# Patient Record
Sex: Male | Born: 1970 | Race: White | Hispanic: No | Marital: Single | Smoking: Current every day smoker
Health system: Southern US, Community
[De-identification: ages and names within clinical notes are randomized; demographics above are authoritative.]

## PROBLEM LIST (undated history)

## (undated) DIAGNOSIS — L219 Seborrheic dermatitis, unspecified: Secondary | ICD-10-CM

## (undated) DIAGNOSIS — C4491 Basal cell carcinoma of skin, unspecified: Secondary | ICD-10-CM

## (undated) DIAGNOSIS — R748 Abnormal levels of other serum enzymes: Secondary | ICD-10-CM

## (undated) DIAGNOSIS — C4359 Malignant melanoma of other part of trunk: Secondary | ICD-10-CM

## (undated) DIAGNOSIS — F5104 Psychophysiologic insomnia: Secondary | ICD-10-CM

## (undated) DIAGNOSIS — G2581 Restless legs syndrome: Secondary | ICD-10-CM

## (undated) HISTORY — DX: Restless legs syndrome: G25.81

## (undated) HISTORY — DX: Malignant melanoma of other part of trunk: C43.59

## (undated) HISTORY — DX: Abnormal levels of other serum enzymes: R74.8

## (undated) HISTORY — DX: Psychophysiologic insomnia: F51.04

## (undated) HISTORY — DX: Basal cell carcinoma of skin, unspecified: C44.91

## (undated) HISTORY — DX: Seborrheic dermatitis, unspecified: L21.9

---

## 2015-01-20 ENCOUNTER — Emergency Department (HOSPITAL_COMMUNITY)
Admission: EM | Admit: 2015-01-20 | Discharge: 2015-01-20 | Disposition: A | Discharge status: Home / Self Care | Payer: No Typology Code available for payment source | Source: Home / Self Care

## 2015-01-20 ENCOUNTER — Encounter (HOSPITAL_COMMUNITY): Payer: Self-pay | Admitting: Emergency Medicine

## 2015-01-20 DIAGNOSIS — D229 Melanocytic nevi, unspecified: Secondary | ICD-10-CM

## 2015-01-20 DIAGNOSIS — D239 Other benign neoplasm of skin, unspecified: Secondary | ICD-10-CM

## 2015-01-20 NOTE — ED Notes (Signed)
Pt here b/c he would like to have biopsy of birthmark?? On back It's been there since 2010... Denies pain and growth Alert, no signs of acute distress.

## 2015-01-20 NOTE — ED Provider Notes (Signed)
   CSN: [id number]     Arrival date & time 01/20/15  1301 History   None    Chief Complaint  Patient presents with  . Skin Problem  . Nevus   (Consider location/radiation/quality/duration/timing/severity/associated sxs/prior Treatment)  HPI   Is a 44 year old male presenting today with complaints of an irregularly shaped mole on the back of his left shoulder blade that he feels needs to be biopsied and removed.  Denies any other complaints or s/s of illness.  The patient states it was not present several years ago and has changed shape and size. Requests referral.   History reviewed. No pertinent past medical history. History reviewed. No pertinent past surgical history. No family history on file. History  Substance Use Topics  . Smoking status: Current Every Day Smoker -- 0.50 packs/day    Types: Cigarettes  . Smokeless tobacco: Not on file  . Alcohol Use: Yes    Review of Systems  Constitutional: Negative.  Negative for fever and fatigue.  HENT: Negative.   Eyes: Negative.   Respiratory: Negative.   Cardiovascular: Negative.   Gastrointestinal: Negative.   Endocrine: Negative.   Genitourinary: Negative.   Musculoskeletal: Negative.   Skin:       Irregular birthmark.   Allergic/Immunologic: Negative.  Negative for immunocompromised state.  Neurological: Negative.   Hematological: Negative.   Psychiatric/Behavioral: Negative.     Allergies  Review of patient's allergies indicates no known allergies.  Home Medications   Prior to Admission medications   Not on File   BP 129/86 mmHg  Pulse 94  Temp(Src) 99.1 F (37.3 C) (Oral)  Resp 18  SpO2 96%   Physical Exam  Constitutional: He appears well-developed and well-nourished. No distress.  Cardiovascular: Normal rate, regular rhythm, normal heart sounds and intact distal pulses.  Exam reveals no gallop and no friction rub.   No murmur heard. Skin: Skin is warm, dry and intact. He is not diaphoretic. No pallor.    See attached image.   Nursing note and vitals reviewed.    Skin lesion is irregularly shaped and slightly greater than 1 cm in diameter. It is slightly raised with varying color and dark spots in center.  Suspicious for melanoma and should be biopsied.  ED Course  Procedures (including critical care time) Labs Review Labs Reviewed - No data to display  Imaging Review No results found.    MDM   1. Atypical nevi    Stressed the importance of appropriate follow-up with biopsy and removal by dermatologist or primary care provider soon as possible as lesion is suspicious for melanoma. The patient verbalizes understanding and agrees to plan of care.     Nehemiah Settle, NP 01/20/15 [place] Sila Sarsfield, NP 01/20/15 1443  Addendum - 3435 The patient's mother called numerous times stating that the referral for her son to a dermatologist was insufficient and he needed an order to be seen by the [facility].  Request for congregational nursing to facilitate the referral made to Emelda Fear.   Nehemiah Settle, NP 01/20/15 [phone]

## 2015-01-20 NOTE — ED Notes (Signed)
Pt   Requesting      A     referall     To  A   Dermatologist        Jacqualyn Posey   Spoke   With      A  Dermatologist

## 2015-01-20 NOTE — Discharge Instructions (Signed)
This is NOT a diagnosis of Melanoma.  This is information for you to review to ensure you follow up appropriately. Follow up with any dermatologist as soon as possible.  If [facility] will not schedule you contact the [facility] for additional options.  Melanoma Melanoma is a form of skin cancer that begins in melanocytes. Melanocytes are the skin cells that produce pigment. Melanoma starts as a mole on the skin and can spread to other parts of the body. If found early, many cases of melanoma are curable.  CAUSES  The exact cause is unknown.  RISK FACTORS  Spending a lot of time in the sun, under a sunlamp, or in a tanning booth.  Having sunburn that blisters. The more blistering sunburns you have, the higher your risk of melanoma.  Spending time in parts of the world with more intense sunlight.  Living in a hot, sunny climate.  Having fair skin that does not tan easily.  Having had melanoma before.  Having a family history of melanoma.  Having more than 100 skin moles. SIGNS AND SYMPTOMS   ABCDE changes in a mole. ABCDE stands for:   Asymmetry. This means the mole has an irregular shape. It is not round or oval.  Border. This means the mole has an irregular or bumpy border.  Color. This means the mole has multiple colors in it, including brown, black, blue, red, or tan.  Diameter. This means the mole is more than 0.2 in (6 mm) across.  Evolving. This refers to any unusual changes or symptoms in the mole, such as pain, itching, stinging, sensitivity, or bleeding.  A new mole.  Swollen lymph nodes.  Shortness of breath.  Bone pain.  Headache.  Seizures.  Visual problems. DIAGNOSIS  Your health care provider will take a tissue sample from the mole to examine under a microscope (biopsy). The biopsy will reveal whether melanoma has spread to deeper layers of the skin. Your health care provider will also order tests, including:  Blood  tests.  Chest X-rays.  A CT scan.  A bone scan. TREATMENT  You will have surgery to remove the cancer. Your lymph nodes may also be removed during the surgery. If melanoma has spread to other organs, such as the liver, lungs, bone, or brain, you will need additional treatment.  PREVENTION  Melanoma may come back (recur) after it has been treated. Your risk of recurrence is higher if you had thick or ulcerated tumors or patches of tumors. Generally, the more advanced your melanoma, the more likely it will recur. You can help prevent melanoma from recurring by staying out of the sun, especially during peak midafternoon hours. When you are outdoors or in the sun:  Wear long sleeves, a hat, and sunglasses that block UV light when possible.  Apply a sunscreen with an SPF of 30 or higher regularly. Take these precautions on cloudy days and in the winter, even if you will be outdoors for only a short period of time. SEEK MEDICAL CARE IF:  You notice any new growths or changes in your skin.  You have had melanoma removed and you notice a new growth in the same location. Document Released: 07/08/2005 Document Revised: 11/22/2013 Document Reviewed: 09/01/2013 [facility] Patient Information 2015 [place], [place]. This information is not intended to replace advice given to you by your health care provider. Make sure you discuss any questions you have with your health care provider.

## 2015-02-07 DIAGNOSIS — C4359 Malignant melanoma of other part of trunk: Secondary | ICD-10-CM

## 2015-02-07 HISTORY — DX: Malignant melanoma of other part of trunk: C43.59

## 2015-02-07 HISTORY — PX: OTHER SURGICAL HISTORY: SHX169

## 2015-04-24 DIAGNOSIS — R748 Abnormal levels of other serum enzymes: Secondary | ICD-10-CM

## 2015-04-24 HISTORY — DX: Abnormal levels of other serum enzymes: R74.8

## 2015-07-11 ENCOUNTER — Ambulatory Visit: Payer: Self-pay | Admitting: Internal Medicine

## 2015-07-31 ENCOUNTER — Ambulatory Visit: Payer: Self-pay | Admitting: Internal Medicine

## 2015-12-29 ENCOUNTER — Telehealth: Payer: Self-pay | Admitting: Internal Medicine

## 2015-12-29 DIAGNOSIS — C4359 Malignant melanoma of other part of trunk: Secondary | ICD-10-CM

## 2015-12-29 NOTE — Telephone Encounter (Signed)
Email received from Stormy Fabian at [facility]  . Patient is due for recheck at [facility]. Per Juliann Pulse at dermatologist office patient needs to be covered by [facility] and a new referral is needed before they can schedule.  [facility] eligibility dates for patient 10/12/15 to 04/13/16. Please send message back to me when referral is ready.

## 2016-01-02 DIAGNOSIS — C4359 Malignant melanoma of other part of trunk: Secondary | ICD-10-CM | POA: Insufficient documentation

## 2016-01-13 ENCOUNTER — Encounter: Payer: Self-pay | Admitting: Internal Medicine

## 2016-01-18 NOTE — Telephone Encounter (Signed)
Referral faxed to Stormy Fabian on 01/04/16

## 2016-04-07 ENCOUNTER — Encounter (HOSPITAL_COMMUNITY): Payer: Self-pay | Admitting: Emergency Medicine

## 2016-04-07 ENCOUNTER — Encounter (HOSPITAL_COMMUNITY): Payer: Self-pay

## 2016-04-07 ENCOUNTER — Emergency Department (HOSPITAL_COMMUNITY)
Admission: EM | Admit: 2016-04-07 | Discharge: 2016-04-07 | Disposition: A | Discharge status: Home / Self Care | Payer: No Typology Code available for payment source | Attending: Emergency Medicine | Admitting: Emergency Medicine

## 2016-04-07 ENCOUNTER — Ambulatory Visit (HOSPITAL_COMMUNITY)
Admission: EM | Admit: 2016-04-07 | Discharge: 2016-04-07 | Disposition: A | Discharge status: Nursing Home (Includes ICF) | Payer: No Typology Code available for payment source | Attending: Family Medicine | Admitting: Family Medicine

## 2016-04-07 DIAGNOSIS — F17210 Nicotine dependence, cigarettes, uncomplicated: Secondary | ICD-10-CM | POA: Insufficient documentation

## 2016-04-07 DIAGNOSIS — R5383 Other fatigue: Secondary | ICD-10-CM

## 2016-04-07 DIAGNOSIS — Z789 Other specified health status: Secondary | ICD-10-CM

## 2016-04-07 DIAGNOSIS — Z7289 Other problems related to lifestyle: Secondary | ICD-10-CM

## 2016-04-07 DIAGNOSIS — Z85820 Personal history of malignant melanoma of skin: Secondary | ICD-10-CM | POA: Insufficient documentation

## 2016-04-07 DIAGNOSIS — F1010 Alcohol abuse, uncomplicated: Secondary | ICD-10-CM

## 2016-04-07 DIAGNOSIS — R42 Dizziness and giddiness: Secondary | ICD-10-CM

## 2016-04-07 LAB — CBC
HCT: 48.8 % (ref 39.0–52.0)
Hemoglobin: 16.3 g/dL (ref 13.0–17.0)
MCH: 32.9 pg (ref 26.0–34.0)
MCHC: 33.4 g/dL (ref 30.0–36.0)
MCV: 98.4 fL (ref 78.0–100.0)
PLATELETS: 132 10*3/uL — AB (ref 150–400)
RBC: 4.96 MIL/uL (ref 4.22–5.81)
RDW: 13.2 % (ref 11.5–15.5)
WBC: 5.3 10*3/uL (ref 4.0–10.5)

## 2016-04-07 LAB — BASIC METABOLIC PANEL
Anion gap: 14 (ref 5–15)
BUN: 5 mg/dL — AB (ref 6–20)
CALCIUM: 9.2 mg/dL (ref 8.9–10.3)
CHLORIDE: 103 mmol/L (ref 101–111)
CO2: 22 mmol/L (ref 22–32)
CREATININE: 0.62 mg/dL (ref 0.61–1.24)
GFR calc Af Amer: >60 mL/min (ref >60)
GFR calc non Af Amer: >60 mL/min (ref >60)
Glucose, Bld: 87 mg/dL (ref 65–99)
Potassium: 3.8 mmol/L (ref 3.5–5.1)
SODIUM: 139 mmol/L (ref 135–145)

## 2016-04-07 LAB — ETHANOL: Alcohol, Ethyl (B): 342 mg/dL (ref <5)

## 2016-04-07 NOTE — ED Provider Notes (Signed)
CSN: [id number]     Arrival date & time 04/07/16  1614 History   First MD Initiated Contact with Patient 04/07/16 1725     Chief Complaint  Patient presents with  . Fatigue  . Drug / Alcohol Assessment   (Consider location/radiation/quality/duration/timing/severity/associated sxs/prior Treatment) Patient is a 45 y.o male, with no significant PMH, presents today for fatigue. He reports haven't been feeling well for 2 weeks. Other people have been telling him that he is lethargic. He also notice himself being lethargic at times. Other people also reports that he smells like alcohol but "they shouldn't be smelling alcohol". Patient admits to drinking alcohol. For the "past decade", he have been two 12 oz can of beer daily with 2 shot of vodka mixed in it. Patient drinks this every night including last night. He also drank 6oz of beer this morning at 0800. He reports his speech to be slurred and was feeling dizzy so he had to take a cab to get here to the urgent care.       Past Medical History:  Diagnosis Date  . 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 Sarajane Jews, [facility]   History reviewed. No pertinent surgical history. No family history on file. Social History  Substance Use Topics  . Smoking status: Current Every Day Smoker    Packs/day: 0.50    Types: Cigarettes  . Smokeless tobacco: Never Used  . Alcohol use 1.8 oz/week    3 Cans of beer per week    Review of Systems  Constitutional: Positive for fatigue. Negative for chills and fever.  HENT: Negative for congestion, rhinorrhea and sneezing.   Eyes:       + watery eyes  Respiratory: Negative for cough and shortness of breath.   Cardiovascular: Negative for chest pain, palpitations and leg swelling.  Gastrointestinal: Negative for abdominal pain, diarrhea, nausea and vomiting.  Genitourinary: Negative for discharge, dysuria and penile pain.  Musculoskeletal: Negative for  myalgias.  Neurological: Positive for dizziness. Negative for weakness and headaches.       Not dizzy right now  Psychiatric/Behavioral: Negative for confusion.    Allergies  Review of patient's allergies indicates no known allergies.  Home Medications   Prior to Admission medications   Not on File   Meds Ordered and Administered this Visit  Medications - No data to display  BP 138/84 (BP Location: Left Arm)   Pulse 86   Temp 98.3 F (36.8 C) (Oral)   Resp 18   SpO2 98%  No data found.   Physical Exam  Constitutional: No distress.  Face is flushed  HENT:  Head: Normocephalic and atraumatic.  Right Ear: External ear normal.  Left Ear: External ear normal.  Nose: Nose normal.  Mouth/Throat: No oropharyngeal exudate.  Eyes: Conjunctivae and EOM are normal. Pupils are equal, round, and reactive to light. Right eye exhibits no discharge. Left eye exhibits no discharge.  Neck: Normal range of motion. Neck supple.  Cardiovascular: Normal rate, regular rhythm, normal heart sounds and intact distal pulses.   No murmur heard. Pulmonary/Chest: Effort normal and breath sounds normal. No respiratory distress. He has no wheezes.  Abdominal: Soft. Bowel sounds are normal. He exhibits no distension. There is no tenderness.  Musculoskeletal: Normal range of motion. He exhibits no edema or deformity.  Lymphadenopathy:    He has no cervical adenopathy.  Neurological:  Mental status. Appearance, behavior, speech appropriate. Alert and oriented to person, place,  time. Thoughts coherent. Motor no atrophy, weakness, or tremors. Gait appropriate.  Sensory and motor intact. Cranial nerves intact. Aable to walk straight-line heel to toe fashion. Romberg positive.    Skin: He is not diaphoretic.  Nursing note and vitals reviewed.   Urgent Care Course   Clinical Course    Procedures (including critical care time)  Labs Review Labs Reviewed - No data to display  Imaging Review No  results found.       MDM   1. Alcohol consumption of one to four drinks per day    Patient has facial flushing and positive romberg on exam, otherwise well. He is otherwise healthy with no significant medical history. His vital signs are appropriate. His symptoms is very highly suspected to be due to alcohol consumption. Supervising consulted and patient send to ER for fluid, and electrolyte check. Patient is fine with this plan. Transferred via shuttle.    Barry Dienes, NP 04/07/16 1902

## 2016-04-07 NOTE — ED Provider Notes (Signed)
[facility] DEPT Provider Note   CSN: [id number] Arrival date & time: 04/07/16  1807     History   Chief Complaint Chief Complaint  Patient presents with  . Fatigue    HPI Stephen Miranda is a 45 y.o. male.  The patient is a 45 year old male, he admits to having a history of heavy alcohol consumption including last night. He has artery been to the urgent care today, they sent him here because of an abnormal neurologic exam as he was off balance when walking. The patient endorses that he does drink heavily, he drank last night and then drank again this morning all of the alcohol that he did not finish last night. The patient states that he feels a little bit wobbly when he walks when he drinks, he denies drinking that much today despite having an alcohol level of 342 on arrival. He denies any other physical symptoms, he denies fatigue, he denies chest pain, denies shortness of breath, denies abdominal pain, denies nausea vomiting or diarrhea.      Past Medical History:  Diagnosis Date  . 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 Sarajane Jews, [facility]    Patient Active Problem List   Diagnosis Date Noted  . Melanoma ([place]) 01/02/2016    History reviewed. No pertinent surgical history.     Home Medications    Prior to Admission medications   Not on File    Family History No family history on file.  Social History Social History  Substance Use Topics  . Smoking status: Current Every Day Smoker    Packs/day: 0.50    Types: Cigarettes  . Smokeless tobacco: Never Used  . Alcohol use 1.8 oz/week    3 Cans of beer per week     Allergies   Review of patient's allergies indicates no known allergies.   Review of Systems Review of Systems  All other systems reviewed and are negative.    Physical Exam Updated Vital Signs BP 125/75 (BP Location: Right Arm)   Pulse 92   Temp 98.4 F (36.9 C) (Oral)   Resp 18   Ht  6\' 4"  (1.93 m)   Wt 200 lb (90.7 kg)   SpO2 96%   BMI 24.34 kg/m   Physical Exam  Constitutional: He appears well-developed and well-nourished. No distress.  HENT:  Head: Normocephalic and atraumatic.  Mouth/Throat: Oropharynx is clear and moist. No oropharyngeal exudate.  Eyes: Conjunctivae and EOM are normal. Pupils are equal, round, and reactive to light. Right eye exhibits no discharge. Left eye exhibits no discharge. No scleral icterus.  Neck: Normal range of motion. Neck supple. No JVD present. No thyromegaly present.  Cardiovascular: Normal rate, regular rhythm, normal heart sounds and intact distal pulses.  Exam reveals no gallop and no friction rub.   No murmur heard. Pulmonary/Chest: Effort normal and breath sounds normal. No respiratory distress. He has no wheezes. He has no rales.  Abdominal: Soft. Bowel sounds are normal. He exhibits no distension and no mass. There is no tenderness.  Musculoskeletal: Normal range of motion. He exhibits no edema or tenderness.  Lymphadenopathy:    He has no cervical adenopathy.  Neurological: He is alert. Coordination normal.  The patient's speech is clear, he is able to follow commands with normal strength, normal sensation, normal coordination while laying in the bed.  Skin: Skin is warm and dry. No rash noted. No erythema.  Psychiatric: He has a normal mood and  affect. His behavior is normal.  Nursing note and vitals reviewed.    ED Treatments / Results  Labs (all labs ordered are listed, but only abnormal results are displayed) Labs Reviewed  BASIC METABOLIC PANEL - Abnormal; Notable for the following:       Result Value   BUN 5 (*)    All other components within normal limits  CBC - Abnormal; Notable for the following:    Platelets 132 (*)    All other components within normal limits  ETHANOL - Abnormal; Notable for the following:    Alcohol, Ethyl (B) 342 (*)    All other components within normal limits  URINALYSIS,  ROUTINE W REFLEX MICROSCOPIC (NOT AT [facility])  CBG MONITORING, ED    EKG  EKG Interpretation  Date/Time:  Sunday April 07 2016 18:23:22 EDT Ventricular Rate:  83 PR Interval:  154 QRS Duration: 108 QT Interval:  384 QTC Calculation: 451 R Axis:   46 Text Interpretation:  Normal sinus rhythm Normal ECG No old tracing to compare Confirmed by Elisabeth Strom  MD, Jessice Madill ([id number]) on 04/07/2016 9:50:32 PM       Radiology No results found.  Procedures Procedures (including critical care time)  Medications Ordered in ED Medications - No data to display   Initial Impression / Assessment and Plan / ED Course  I have reviewed the triage vital signs and the nursing notes.  Pertinent labs & imaging results that were available during my care of the patient were reviewed by me and considered in my medical decision making (see chart for details).  Clinical Course    The patient is clearly intoxicated with alcohol, he is lying about his alcohol consumption based on his alcohol level. He denies any coingestants substances including ethylene glycol, rubbing alcohol or mouthwash. I suspect that he has a significant alcohol problem, he was offered assistance, he declines, he is stable for discharge, he has a sober ride.  Final Clinical Impressions(s) / ED Diagnoses   Final diagnoses:  Alcohol abuse    New Prescriptions New Prescriptions   No medications on file     Noemi Chapel, MD 04/07/16 2159

## 2016-04-07 NOTE — ED Notes (Signed)
Pt has ETOH odor with bilateral redness to his eyes. Pt reports drinking two beers and two shots of Vodka every night before he goes to bed.

## 2016-04-07 NOTE — ED Notes (Addendum)
Critical etoh called by lab 342

## 2016-04-07 NOTE — Discharge Instructions (Signed)
Substance Abuse Treatment Programs ° °Intensive Outpatient Programs °[facility]     °[address]      °[place], [place]                   °[phone]      ° °[facility] °[address] °[place], [place] °[phone] ° °[facility]     °(Inpatient and outpatient)     °[address]           °[phone]   ° °[facility] °[phone] (Suboxone and Methadone) ° °[address]      °[place], [place] [address]      °[phone]      ° °[address] °[place], [place] °[phone] ° °[facility] (Outpatient/Inpatient, Chemical)    °(insurance only) [phone]      °       °[facility] (Groups & Residential) °[place], [place] °[phone] ° °   °[facility]     °[address]     °[place], [place]      °[phone]      ° °[facility] (for caregivers and family) °[address] °[place], [place] °336-299-4655 ° ° ° ° ° °Residential Treatment Programs °[facility]      °[address], [place], [place] [address]  °[phone]      ° °[facility] °[address] [place], [place] [address] °[phone] ° °[facility]        °336-248-8914      ° °[facility] °1-800-659-3381 ° °[facility]             °[address]                                         °[place], [place]                                                °877-615-2722 or 336-784-9470                              ° °[facility] [place] °[address] °[place], [address] °1.877.941.8954 ° °[facility]    °[address]      °[place], [place]     °[phone]      ° °[facility] °[address] °[place], [place] °[phone] ° °[facility]   °[address]     °[place], [place] [address]     °[phone]      °Admissions: 8am-3pm M-F ° °[facility] °[address] °[place],  [place] °336-227-7417 ° °[facility]: Residential Program (90 Days)   °[place], [place]      °336-725-8389 or 800-758-6077    ° °[facility] °[place], [place] °(Walk in Hours over the weekend or by referral) ° °[facility] °[address], [place],  [address] °([phone]) [phone] ° °Crisis Mobile: [facility]:  1-877-626-1772 (for crisis response 24 hours a day) °[facility] Hotline:      1-800-256-2452 °Outpatient Psychiatry and Counseling ° °[facility]: Mobile Crisis   Management 24 hours:  1-877-626-1772 ° °[facility] sliding scale fee and walk in schedule: M-F 8am-12pm/1pm-3pm °[address]  °[place], [place] [address] °[phone] ° °[facility] °[address] °[place], [place] [address] °336-703-9650 ° °[facility] ([facility])- new patient walk-in appointments available Monday - Friday 8am -3pm.          °[address] °[place], [place] [address] °336-676-6840 or crisis line- 336-676-6905 ° °[facility]/ Intensive Outpatient Therapy Program °[address] °[place], [place] [address] °336-832-9804 ° °[facility]                  °Crisis Services      °[phone]      °[address]     °[place], [place] [address]                ° °[facility]   °[facility] °[phone] °[address] °[place], [place] [address] ° ° °[facility]          °[address]  °[place], [place] [address]       °([phone] ° °[facility] °[address] °[place], [place] [address] °336-292-1510 ° °[facility]    °[address]    °[place], [place] [address]     °[phone]      ° °Parish McKinney, MD     °[address]     °[place] [address]     °[phone]     °  °[facility] °[address] °[place] [address] ° °[facility]     °[address]     °[place], [place]      °336-542-2076      ° °[facility] °Shamsher Ahluwalia, MD °[address] °[place], [place] [address] °[phone] ° °[facility]     °[address]     °[place], [place] [address]     °336-274-1237      ° °[facility] °[address] °[place], [place] [address] °336-854-4450 °Resources for Temporary Residential Assistance/Crisis Centers ° °DAY CENTERS °[facility] °M-F 8am-3pm   °[address] [place], [place] [address]   336-332-0824 °Services include: laundry, barbering, support groups, case management, phone  & computer access, showers, AA/NA mtgs, mental health/substance abuse nurse, job skills class, disability information, VA assistance, spiritual classes, etc.  ° °HOMELESS SHELTERS ° °[facility]     °[facility]   °[address], [place]     °336.271.5959       °       °[facility] (women and children)       °[address] °[place], [place] [address] °336-275-0820 °[email or web address] for application and process °Application Required ° °[facility]   °[address]    °[place] [address]     °[phone]       °             °[facility] °[address] °[place], [place] [address] °336.273.5572 °336-235-0363(schedule application appt.) °Application Required ° °[facility] (women only)    °[address]     °[place], [place] [address]     °336-884-1039      °  Intake starts 6pm daily °Need valid ID, SSC, & Police report °[facility] [place] °[address] °[place], [place] °336-881-5420 °Application Required ° °[facility] (men only)     °[address]      °[place], [place]     °[phone]      ° °[facility] °(Pregnant women only) °[address] °[place], [place] °336-275-0206 ° °[facility]      °[address]      °[place], [place] [address]     °[phone]      °       °[facility] [place] [facility] °[address] °[place], [place] °336-723-1848 °90 day commitment/SA/Application process ° °[facility](men only)     °[address]     °[place], [place]     °[phone]       °Check-in at 7pm     °       °[facility] °[address] °[place], [place] [address] °336-248-6684 °Men/Women/Women and Children must be there by 7 pm ° °[facility] °[place], [place] °336-722-8721                ° °

## 2016-04-07 NOTE — ED Notes (Signed)
Dr. Lenna Sciara called with critical etoh value

## 2016-04-07 NOTE — ED Triage Notes (Signed)
Per Pt, Pt is coming from UC with complaints of "feeling drunk" without having a drink in the last ten hours. Pt reprt slurred speech and generalized fatigue that has been going on for a couple months, but he noticed today being worse. Pt denies medical Hx of the same.

## 2016-04-07 NOTE — ED Triage Notes (Signed)
Pt c/o feeling weak and tired and reports he "feels drunk"  States he had 36 ounces of liquor in 24 hours  Voices no other concerns.... A&O x4... NAD

## 2016-04-10 ENCOUNTER — Encounter: Payer: Self-pay | Admitting: Internal Medicine

## 2016-04-10 ENCOUNTER — Ambulatory Visit (INDEPENDENT_AMBULATORY_CARE_PROVIDER_SITE_OTHER): Payer: Self-pay | Admitting: Internal Medicine

## 2016-04-10 VITALS — BP 110/70 | HR 68 | Resp 12 | Ht 73.75 in | Wt 198.0 lb

## 2016-04-10 DIAGNOSIS — G2581 Restless legs syndrome: Secondary | ICD-10-CM

## 2016-04-10 DIAGNOSIS — F1010 Alcohol abuse, uncomplicated: Secondary | ICD-10-CM

## 2016-04-10 MED ORDER — GABAPENTIN 100 MG PO CAPS: ORAL_CAPSULE | ORAL | 3 refills | Status: DC

## 2016-04-10 NOTE — Patient Instructions (Signed)
Gabapentin --start with 1 cap by mouth at bedtime.  May increase by 100 mg cap every 3 days if symptoms not relieved. No alcohol with this

## 2016-04-10 NOTE — Progress Notes (Signed)
Subjective:    Patient ID: Stephen Miranda, male    DOB: 1970/11/16, 45 y.o.   MRN: [id number]  HPI  Pt. Here after long hiatus.  Was seen in Urgent Care and ED 4 days ago with alcohol level of 342 mg/dL.  Romberg in Urgent care documented as positive I thought he was asking to get treatment for alcohol abuse, but patient denies that today and in fact, does not feel he has a drinking problem.  He is drinking two 12 oz beers with a shot of vodka in each nightly.  The day he was seen, he apparently repeated that intake that morning.   He has had these two drinks nightly for many years so he can sleep.  Maybe for 15 years. He states he has not had anything alcoholic to drink since Sunday (the day he was seen in ED).  He states he felt ok, but was convinced by his coworker that morning/noon at work that he was not "ok" and should be seen.  His coworker told him he was slurring and moving very slowly.  His mother also ended up calling him at work and felt the same way.  She convinced him to go to the ED.  He states he finished his drinking the night before around 1 a.m.  Was up at 7:30 a.m. And had the last 2 drinks soon after.    He feels that since he has stayed away from the alcohol, he feels foggy, but he has not been sleeping well also.  He feels he is just fatigued. He also feels he has restless leg syndrome and that is what keeps him up. Does admit he may be depressed.  Has had counseling when child as picked on, gained weight and picked on even more. "always screw stuff up at some point" Has felt spacy and forgetful--like he is either watching 4 screens at the same time, or he can be hyperfocused and miss everything around him. Was prescribed prozac in college--not sure why, but med made him not care that he was failing. Attempted suicide at age 76 yo trying to freeze himself to death. States was diagnosed with ADD.  Took different things--made him spacey and ultimately erectile dysfunction. Does  feel he has difficulties picking up on social cues, awkward socially often.  When asked if he had ever been evaluated for Asperger's, he stated he has thought in past he could be mildly affected by Aspergers or Autistic spectrum   Has had restless legs since moving here 15 years ago.        Depression screen [facility] 2/9 04/10/2016  Decreased Interest 0  Down, Depressed, Hopeless 1  PHQ - 2 Score 1  Altered sleeping 3  Tired, decreased energy 2  Change in appetite 1  Feeling bad or failure about yourself  2  Trouble concentrating 1  Moving slowly or fidgety/restless 1  Suicidal thoughts 0  PHQ-9 Score 11  Difficult doing work/chores Somewhat difficult     Review of Systems     Objective:   Physical Exam Mildly anxious appearing HEENT:  PERRL, EOMI, discs sharp, TMs pearly gray, throat without injection, Neck:  Supple, No adenopathy Chest:  CTA CV: RRR without murmur or rub, radial and DP pulses normal and equal Abd:  S, NT, No HSM or mass, + BS LE:  No edema Neuro:  A & O x 3 , CN II-XII grossly intact, DTRs 2+/4 throughout, Motora 5/5 throughout, sensory grossly normal.  Rapid  alternating motions, gait normal.  Speech clear.       Assessment & Plan:  1.  Restless Leg Syndrome:  This seems to be the patient's main focus today.  Seems to feel if he can get this under control, he will be able to sleep and not have to drink his equivalent of 4 drinks nightly. Unable to obtain Requip or Mirapex at an affordable cost.  Will try Gabapentin Try Gabapentin.  Gradually titrate to 300 mg at bedtime.  To avoid alcohol. Followup in 6 weeks  2.  Concern for alcohol abuse:  Did have elevated AST/ALT when established last year consistent with alcohol overuse. Encouraged patient to avoid.  Will start counseling sessions also with SW,Tybreisha Coralyn Mark. No findings of other neurologic disorder to explain behavior recently at work.  3.  Social awkwardness/awareness: Evaluation with Elvis Coil  regarding possibility of something on autistic spectrum.  May also have depression/anxiety as well.  Consider treatment for the latter two if no improvement.

## 2016-04-26 ENCOUNTER — Ambulatory Visit (INDEPENDENT_AMBULATORY_CARE_PROVIDER_SITE_OTHER): Payer: Self-pay | Admitting: Licensed Clinical Social Worker

## 2016-04-26 DIAGNOSIS — Z1389 Encounter for screening for other disorder: Secondary | ICD-10-CM

## 2016-04-26 DIAGNOSIS — Z1331 Encounter for screening for depression: Secondary | ICD-10-CM

## 2016-05-01 NOTE — Progress Notes (Signed)
   THERAPY PROGRESS NOTE  Session Time: 60 minutes  Participation Level: Active  Behavioral Response: Well GroomedAlertEuthymic  Type of Therapy: Individual Therapy  Treatment Goals addressed: Anxiety and Coping  Interventions: Motivational Interviewing and Supportive  Summary: Stephen Miranda is a 45 y.o. male who presents with a euthymic mood and appropriate affect. Stephen Miranda reported that he was seeking counseling as a recommendation from Dr. Tawni Carnes because of signs of depression. He mentioned that he struggled with depression during his childhood and adolescence due to bullying but participated in activities with his family to cope. 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 He explained that at this point he feels like he is stuck and cannot get anywhere. He mentioned that he grew up with [place] parents, therefore he moved around a lot. He has had a history of mental health treatment when he went to counseling as an adolescent for his depression. He has also been diagnosed with ADHD as well. He mentioned that he is currently single but ended a relationship with his girlfriend around a year ago because of an incident involving alcohol. He mentioned some incidents involving alcohol such as the situation with his girlfriend and other times when he was binge drinking. He also stated that his grandmother and other family members on his dad side have struggled with alcoholism. He did not state that he personally had an alcohol problem. Stephen Miranda mentioned that he wants his treatment goal to be focused on depression, finding motivation, discovering new interests, and his smoking  Suicidal/Homicidal: Nowithout intent/plan  Therapist Response: Social Work Intern (SWI) began the clinical assessment but did not complete the symptoms checklist. SWI reflected on Stephen Miranda's thoughts about reasons  that he came to counseling and his feelings on the difficulties that he's been having lately with feeling stuck. SWI looked into family dynamics, history of symptoms, trauma, and strengths. SWI began the treatment plan with Stephen Miranda. SWI will complete the symptoms checklist by using the PHQ-9 and GAD-7.   Plan: Return again in 2 weeks.  Diagnosis: Axis I: See Hospital Problem List    Axis II: No diagnosis    [name], Student-Social Work 05/01/2016

## 2016-05-10 ENCOUNTER — Ambulatory Visit (INDEPENDENT_AMBULATORY_CARE_PROVIDER_SITE_OTHER): Payer: Self-pay | Admitting: Licensed Clinical Social Worker

## 2016-05-10 DIAGNOSIS — F330 Major depressive disorder, recurrent, mild: Secondary | ICD-10-CM

## 2016-05-10 DIAGNOSIS — F411 Generalized anxiety disorder: Secondary | ICD-10-CM
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
## 2016-05-17 ENCOUNTER — Ambulatory Visit (INDEPENDENT_AMBULATORY_CARE_PROVIDER_SITE_OTHER): Payer: Self-pay | Admitting: Licensed Clinical Social Worker

## 2016-05-17 DIAGNOSIS — F330 Major depressive disorder, recurrent, mild: Secondary | ICD-10-CM

## 2016-05-17 DIAGNOSIS — F411 Generalized anxiety disorder: Secondary | ICD-10-CM
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
## 2016-05-23 ENCOUNTER — Encounter: Payer: Self-pay | Admitting: Internal Medicine

## 2016-05-23 DIAGNOSIS — F5104 Psychophysiologic insomnia: Secondary | ICD-10-CM

## 2016-05-23 DIAGNOSIS — L219 Seborrheic dermatitis, unspecified: Secondary | ICD-10-CM

## 2016-05-23 DIAGNOSIS — Z720 Tobacco use: Secondary | ICD-10-CM | POA: Insufficient documentation

## 2016-05-23 DIAGNOSIS — G2581 Restless legs syndrome: Secondary | ICD-10-CM | POA: Insufficient documentation

## 2016-05-23 HISTORY — DX: Restless legs syndrome: G25.81

## 2016-05-23 HISTORY — DX: Psychophysiologic insomnia: F51.04

## 2016-05-23 HISTORY — DX: Seborrheic dermatitis, unspecified: L21.9

## 2016-05-24 ENCOUNTER — Ambulatory Visit (INDEPENDENT_AMBULATORY_CARE_PROVIDER_SITE_OTHER): Payer: Self-pay | Admitting: Licensed Clinical Social Worker

## 2016-05-24 ENCOUNTER — Ambulatory Visit: Payer: Self-pay | Admitting: Internal Medicine

## 2016-05-24 DIAGNOSIS — F411 Generalized anxiety disorder: Secondary | ICD-10-CM

## 2016-05-24 DIAGNOSIS — F330 Major depressive disorder, recurrent, mild: Secondary | ICD-10-CM

## 2016-05-29 NOTE — Progress Notes (Signed)
   THERAPY PROGRESS NOTE  Session Time: 60 minutes  Participation Level: Active  Behavioral Response: Well GroomedAlertAnxious  Type of Therapy: Individual Therapy  Treatment Goals addressed: Anxiety   Interventions: Solution Focused and Other: Mindfullness  Summary: Stephen Miranda is a 45 y.o. male who presents with a euthymic mood and appropriate affect. Stephen Miranda began the session by explaining some of his concerns with his class that just finished up yesterday. He missed his last big assignment and he did not do as well as expected on his last exam. He expressed some feelings of anxiety because he has to begin a new class on Monday. 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 Stephen Miranda began to put his goals in order and break them into small manageable ones. He mentioned that he did not want any changes with his social life because he experiences some agoraphobic symptoms. He also explained that he has no desire to have a family in the future. 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 Stephen Miranda tried a Nurse, mental health and expressed his concern that they may not work for him long term..   Suicidal/Homicidal: Nowithout intent/plan  Therapist Response: Social work Theatre manager asked Stephen Miranda how his week has been. SWI asked more about his communication with his professor. SWI began to ask what goals Stephen Miranda had in mind for himself and guide him to breaking down those goals into small attainable ones. SWI asked Stephen Miranda to describe what life was like for him when he felt that things were going well for him in his life. SWI ended the session with a relaxation technique.   Plan: Return again in 1 week.  Diagnosis: Axis I: See Hospital Problem List    Axis II: No diagnosis    [name], Student-Social Work 05/29/2016

## 2016-05-31 ENCOUNTER — Ambulatory Visit (INDEPENDENT_AMBULATORY_CARE_PROVIDER_SITE_OTHER): Payer: Self-pay | Admitting: Licensed Clinical Social Worker

## 2016-05-31 DIAGNOSIS — F330 Major depressive disorder, recurrent, mild: Secondary | ICD-10-CM

## 2016-05-31 DIAGNOSIS — F411 Generalized anxiety disorder: Secondary | ICD-10-CM

## 2016-05-31 NOTE — Progress Notes (Signed)
   THERAPY PROGRESS NOTE  Session Time: 60 minutes  Participation Level: Active  Behavioral Response: Well GroomedAlertIrritable  Type of Therapy: Individual Therapy  Treatment Goals addressed: Anxiety  Interventions: CBT, Motivational Interviewing and Solution Focused  Summary: Oniel Karen is a 45 y.o. male who presents with irritable mood and appropriate affect. Antawan completed the depression and anxiety screeners at the beginning of the session. Simba shared some of his frustrations with living at home with his parents. Demarko mentioned feeling stuck because he is unable to communicate that his mother's nagging is hurting him rather than helping him.  He explained that he is not quite ready to mention to his mother that her constant nagging is bothering him out of fear that he will be kicked out. 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 He had some difficulty coming up with any reason to keep himself motivated or anything to look forward to. He realized that the reason why he is having difficulty finding motivation and any enjoyment in his day was because he did not have a job. Matisse decided that his short term goal was to complete the semester with an A broke it down into steps to take to accomplish that goal.    Suicidal/Homicidal: Nowithout intent/plan  Therapist Response: SWI had Edvardo complete the PHQ-9 and GAD-7 screeners. SWI utilized supportive counseling techniques to validate emotions surrounding living with his parents and being in [place]. SWI used motivational interviewing to get a better understanding of what is currently his main concern was and what was hindering his motivation. SWI helped [name] complete a CBT worksheet focused on planning for change.   Plan: Return again in 1 week.  Diagnosis: Axis I: See Hospital Problem List    Axis II: No diagnosis    [name],  Student-Social Work 05/31/2016

## 2016-06-07 ENCOUNTER — Ambulatory Visit (INDEPENDENT_AMBULATORY_CARE_PROVIDER_SITE_OTHER): Payer: Self-pay | Admitting: Licensed Clinical Social Worker

## 2016-06-07 DIAGNOSIS — F411 Generalized anxiety disorder: Secondary | ICD-10-CM

## 2016-06-07 DIAGNOSIS — F330 Major depressive disorder, recurrent, mild: Secondary | ICD-10-CM

## 2016-06-07 NOTE — Progress Notes (Signed)
   THERAPY PROGRESS NOTE  Session Time: 60 minutes  Participation Level: Active  Behavioral Response: Well GroomedAlertEuthymic  Type of Therapy: Individual Therapy  Treatment Goals addressed: Anxiety  Interventions: Motivational Interviewing  Summary: Stephen Miranda is a 45 y.o. male who presents with euthymic mood and appropriate affect. [name] began the session by reflecting on last sessions about support. [name] realized that he did not have a social support system that he could rely on or anyone who relies on him. He explained that he struggles to communicate with others that he wants to socialize. He mentioned that he does not know where to start with finding new connections. He shared that one of the biggest reasons that he does not have a connection with others is due to the fact that he has moved around so much during his life that he doesn't put forth the effort to establish new relationships. He explained that it is tough to establish anything in [place] because he would rather live in [place] again.   Suicidal/Homicidal: Nowithout intent/plan  Therapist Response: Social Work Theatre manager (Perry) inquired more about how he initiates conversations with new people and what attempts he has made in the past. SWI challenged Stephen Miranda to initiate a conversation with someone from his classes to recognize some of his communication patterns. SWI utilized supportive counseling techniques to validate emotions regarding not having a stable connection with others while moving throughout his life. SWI asked [name] to reflect on some of the unique experiences that he had growing up over the years so that he could recognize things that he valued or learned about himself that made him unique.   Plan: Return again in 2 weeks.  Diagnosis: Axis I: See Hospital Problem List    Axis II: No diagnosis    [name], Student-Social Work 06/07/2016

## 2016-06-21 ENCOUNTER — Ambulatory Visit (INDEPENDENT_AMBULATORY_CARE_PROVIDER_SITE_OTHER): Payer: Self-pay | Admitting: Licensed Clinical Social Worker

## 2016-06-21 DIAGNOSIS — F330 Major depressive disorder, recurrent, mild: Secondary | ICD-10-CM

## 2016-06-21 DIAGNOSIS — F411 Generalized anxiety disorder: Secondary | ICD-10-CM

## 2016-06-21 NOTE — Progress Notes (Signed)
   THERAPY PROGRESS NOTE  Session Time: 60 minutes  Participation Level: Active  Behavioral Response: Well GroomedAlertDepressed  Type of Therapy: Individual Therapy  Treatment Goals addressed: Diagnosis: Major Depressive Disorder and Generalized Anxiety Disorder  Interventions: Motivational Interviewing  Summary: Stephen Miranda is a 45 y.o. male who presents with depressed mood and appropriate affect. Stephen Miranda reported that he got his old job back which has been impacting his school work. Stephen Miranda mentioned that he has been struggling with time management between school and work. He stated that he is still struggling with connecting with others and that it has always been an issue for him. He recalled moments during his childhood that have led him to want to hold back in conversation. He mentioned that he has been feeling really down lately and wants to sleep most of the time. Stephen Miranda questioned why he was able to get over the end of his relationship so quickly. He realized that he had difficulty facing a lot of guilt. He also realized that alcohol has played a major part in him losing his job and his relationship. He mentioned that he may want to cut back on the drinking. Stephen Miranda also expressed that he was disappointed that he needed counseling because he should be handling things on his own.    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 SWI inquired more about how things have changed since his job has started. SWI asked what things were standing in the way of him being happy now that not having a job is no longer a barrier. SWI utilized supportive counseling techniques to validate feelings regarding his last relationship and his feelings of guilt. SWI normalized the fact that he was so tearful during the session and that he was ashamed to be in counseling.   Plan: Return again in 1 week.  Diagnosis: Axis I: See Hospital Problem  List    Axis II: No diagnosis    [name], Student-Social Work 06/21/2016

## 2016-06-28 ENCOUNTER — Ambulatory Visit (INDEPENDENT_AMBULATORY_CARE_PROVIDER_SITE_OTHER): Payer: Self-pay | Admitting: Licensed Clinical Social Worker

## 2016-06-28 DIAGNOSIS — F411 Generalized anxiety disorder: Secondary | ICD-10-CM

## 2016-06-28 DIAGNOSIS — F330 Major depressive disorder, recurrent, mild: Secondary | ICD-10-CM

## 2016-06-28 NOTE — Progress Notes (Signed)
   THERAPY PROGRESS NOTE  Session Time: 60 minutes  Participation Level: Active  Behavioral Response: Well GroomedAlertEuthymic  Type of Therapy: Individual Therapy  Treatment Goals addressed: Diagnosis: Major Depressive Disorder and Generalized Anxiety  Interventions: Motivational Interviewing  Summary: [name] is a 45 y.o. 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 He stated that his mother was concerned again with his drinking and that he was considering cutting down on drinking. He explained that he doesn't think that his drinking is a problem but he does admit that drinking has impacted two major aspects of his life. He lifted off the major positives and negatives of drinking but still came to the conclusion that it is not much of a problem but he still wants to reduce the drinking. He stated that he even had a beer at 9am this morning before work.  [name] explained that sometimes he drinks to as an incentive when he has to accomplish a lot or when he has had a long day. [name] also mentions that he is having difficulty feeling motivated to do anything because can't think of any true desires that he really has.   Suicidal/Homicidal: Nowithout intent/plan  Therapist Response: SWI utilized motivational interviewing to gauge where [name] was as far as wanting to control his drinking. SWI asked [name] to list the positives and negatives of drinking to help him consider whether or not the drinking was more helpful or harmful. SWI asked [name] if there are other things that he uses to wind down. SWI utilized supportive counseling techniques as he struggled to think of things to motivated for.   Plan: Return again in 3 weeks.  Diagnosis: Axis I: See Hospital Problem List    Axis II: No diagnosis    [name], Student-Social Work 06/28/2016

## 2016-07-19 ENCOUNTER — Ambulatory Visit: Payer: Self-pay | Admitting: Internal Medicine

## 2016-08-02 ENCOUNTER — Ambulatory Visit (INDEPENDENT_AMBULATORY_CARE_PROVIDER_SITE_OTHER): Payer: Self-pay | Admitting: Licensed Clinical Social Worker

## 2016-08-02 DIAGNOSIS — F330 Major depressive disorder, recurrent, mild: Secondary | ICD-10-CM

## 2016-08-02 DIAGNOSIS — F411 Generalized anxiety disorder: Secondary | ICD-10-CM

## 2016-08-02 NOTE — Progress Notes (Signed)
   THERAPY PROGRESS NOTE  Session Time: 50 minutes  Participation Level: Active  Behavioral Response: Well GroomedAlertAnxious  Type of Therapy: Individual Therapy  Treatment Goals addressed: Diagnosis: Depression  Interventions: Motivational Interviewing  Summary: Stephen Miranda is a 46 y.o. male who presents with anxious mood and appropriate affect. Stephen Miranda mentioned that he has been feeling down because he has been sick. Stephen Miranda notified Social Work Intern that he recently lost his job because he had a "panic attack" on his way to work. He described the incident by stating that he felt no somatic symptoms but he just felt very lost and his mind was cloudy. Stephen Miranda acknowledged some regret that he did not communicate with his boss rather than just taking an absence. Stephen Miranda mentioned no panic attacks in the past. He also stated that right after he turned away from his job he drank in his car and drove home. Stephen Miranda mentioned that his mother wants him to continue [place] meetings but he feels like he is a fraud due to him not seeing his drinking as a true issue despite two major issues involving alcohol. He stated that he is not willing to stop drinking at this point. Stephen Miranda considered whether taking the medication was more helpful for his restless legs rather than drinking. Stephen Miranda explained that the depression issue rather than the depression. Stephen Miranda stated a few goals that he has that were tangible such as a house, dog, and tv but struggled to think of things that he wanted to gain on an emotional level.   Suicidal/Homicidal: Nowithout intent/plan  Therapist Response: Social Work Intern asked Stephen Miranda to give details of his panic attack step by step. SWI asked Stephen Miranda if he experienced any somatic symptoms during his panic attack. SWI asked about any negative thoughts or feelings about his job that may have been worrying him. SWI assessed Stephen Miranda's readiness to change in terms of his drinking by using scaling questions. SWI inquired  more about Stephen Miranda's goals and desires. After Stephen Miranda mentioned only physical things that he wanted SWI asked if Stephen Miranda could think of any personal goals that he had or in relation to his emotional wellbeing. SWI intern suggested that they discuss more personal goals in the next session as well as practicing more relaxation techniques.   Plan: Return again in 1 week.  Diagnosis: Axis I: See Hospital Problem List    Axis II: No diagnosis    Stephen Miranda, Student-Social Work 08/02/2016

## 2016-08-09 ENCOUNTER — Ambulatory Visit (INDEPENDENT_AMBULATORY_CARE_PROVIDER_SITE_OTHER): Payer: Self-pay | Admitting: Licensed Clinical Social Worker

## 2016-08-09 DIAGNOSIS — F411 Generalized anxiety disorder: Secondary | ICD-10-CM

## 2016-08-09 DIAGNOSIS — F330 Major depressive disorder, recurrent, mild: Secondary | ICD-10-CM

## 2016-08-09 NOTE — Progress Notes (Signed)
   THERAPY PROGRESS NOTE  Session Time: 60 minutes  Participation Level: Active  Behavioral Response: Well GroomedAlertEuthymic  Type of Therapy: Individual Therapy  Treatment Goals addressed: Diagnosis: Major Depressive Disorder, Mild, Reccurent.   Interventions: Supportive  Summary: Kramer Dejesus is a 45 y.o. male who presents with Euthymic mood and appropriate affect. Markian explained that he was still feeling down lately because he was still sick and because of the weather. Damion completed the PHQ-9 and GAD-7. Izaya scored low on the anxiety scale but high on the depression scale. He mentioned depressive symptoms such as poor appetite, low energy, and feelings of hopelessness as the major symptoms that he was feeling. He also mentioned the fact that he doesn't feel sad or angry most of the time but rather a general numb feeling. He explained that he has felt numb for quite some time. Cole stated that he is still able whenever he does feel himself getting angry he just avoids the situation. He also said that he cries sometimes out of the blue for almost 30 for what feels like half an hour. Toriano explained what he included on his vision board. Yuriy had trouble figuring out what inner struggles he wanted to work on to make himself feel fulfilled. Brogan worked on Personal assistant his goals so that they were more attainable.    Suicidal/Homicidal: Nowithout intent/plan  Therapist Response: Social Work Theatre manager worked on completing the PHQ-9 and GAD-7. SWI utilized supportive counseling techniques to validate his thoughts on his current emotional state. SWI asked Paresh to recall any recent event in which normally it would elicit an emotional response and how did he handle it. SWI asked Kishawn if he could think of any positive or negative beliefs he had about himself. SWI worked with [name] as he prioritized his goals into something that is more linear and less intimidating.   Plan: Return again in 1  weeks.  Diagnosis: Axis I: See Hospital Problem List    Axis II: No diagnosis    [name], Student-Social Work 08/09/2016

## 2016-08-16 ENCOUNTER — Ambulatory Visit (INDEPENDENT_AMBULATORY_CARE_PROVIDER_SITE_OTHER): Payer: Self-pay | Admitting: Licensed Clinical Social Worker

## 2016-08-16 DIAGNOSIS — F330 Major depressive disorder, recurrent, mild: Secondary | ICD-10-CM

## 2016-08-16 DIAGNOSIS — F411 Generalized anxiety disorder: Secondary | ICD-10-CM

## 2016-08-16 NOTE — Progress Notes (Signed)
   THERAPY PROGRESS NOTE  Session Time: 45 minutes  Participation Level: Active  Behavioral Response: Well GroomedAlertEuthymic  Type of Therapy: Individual Therapy  Treatment Goals addressed: Coping  Interventions: Solution Focused  Summary: Stephen Miranda is a 46 y.o. male who presents with euthymic mood and appropriate affect. Stephen Miranda began giving details of his new project of bringing small items to class that represent him. He explained that he is worried that it is coming up soon and disappointed that he can't find anything meaningful to bring. Stephen Miranda shared that although he is currently struggling finding the motivation to do anything, he has had great motivation in the past. He said that because he was so young he had the freedom to discover new things and just take up interests on a whim as opposed to now that he is older, he has to buckle down and figure out what he wants to do for the rest of his life. Stephen Miranda noted that the main thing he struggles to find the motivation for was finding a job. He explained that he misses the fact that he could just go into a business and make a personal impression instead of having to apply online. Stephen Miranda stated that the constant rejection is what is holding him back from applying for jobs. Stephen Miranda expressed that he has always had an interest in becoming an obstetrician but has not told many people because of the stigma attached to males in the profession. Stephen Miranda questioned whether or not he should speak to individuals at his school about his interest because of reactions he has had in the past. Stephen Miranda mentioned that he may speak to the director of his program for guidance. Stephen Miranda shared that he was frustrated that there are other obstacles that may interfere with school such as finding a job. Stephen Miranda requested an assignment for the upcoming week.   Suicidal/Homicidal: Nowithout intent/plan  Therapist Response: Social Work Intern suggested that Stephen Miranda look through his storage space  for items to represent him since he did not have many items in his home that represented him. SWI used solution focused technique of finding the exception by asking Stephen Miranda about how he has found the motivation in the past and what was different in his life back then. SWI reinforced his explanation of why he has lost his motivation as he stated that his age was a major aspect of why he has lost his motivation. SWI used scaling questions to get a better understanding of his feeling of rejection as he applies for jobs. SWI validated emotions as he shared that he was embarrassed to tell people that he wanted to become an obstetrician. SWI offered Stephen Miranda the task of writing down the distracting thoughts that come to mind while he is trying to focus on school.   Plan: Return again in 1 weeks.  Diagnosis: Axis I: See Hospital Problem List    Axis II: No diagnosis    [name], Student-Social Work 08/16/2016

## 2016-08-19 NOTE — Addendum Note (Signed)
Addended by: Metta Clines on: 08/19/2016 03:18 PM   Modules accepted: Level of Service

## 2016-08-19 NOTE — Addendum Note (Signed)
Addended by: Metta Clines on: 08/19/2016 03:31 PM   Modules accepted: Level of Service

## 2016-08-19 NOTE — Addendum Note (Signed)
Addended by: Metta Clines on: 08/19/2016 03:27 PM   Modules accepted: Level of Service

## 2016-08-23 ENCOUNTER — Ambulatory Visit (INDEPENDENT_AMBULATORY_CARE_PROVIDER_SITE_OTHER): Payer: Self-pay | Admitting: Licensed Clinical Social Worker

## 2016-08-23 DIAGNOSIS — F411 Generalized anxiety disorder: Secondary | ICD-10-CM

## 2016-08-23 DIAGNOSIS — F330 Major depressive disorder, recurrent, mild: Secondary | ICD-10-CM

## 2016-08-23 NOTE — Progress Notes (Signed)
   THERAPY PROGRESS NOTE  Session Time: 60 minutes  Participation Level: Active  Behavioral Response: Well GroomedAlertEuthymic  Type of Therapy: Individual Therapy  Treatment Goals addressed: Diagnosis: Major Depressive Disorder  Interventions: Solution Focused and Meditation: Guided Meditation  Summary: Remon Levine is a 46 y.o. male who presents with Euthymic mood and appropriate affect. Jermone was notified by Social Work Intern that he needed to renew his orange card. Zao began to discuss how this has been a much better week than the last because his class schedule has been straightened out. Wiley stated that he still wanted to increase his motivation because it was his biggest struggle at this point. Graisyn said that on a scale of 1 to 10 of how he was feeling where 1 is the best and 10 is the worst, he was at a 6 as opposed to a 9 when he first began counseling. Tamarick noted that he has felt a lot more confident in himself as things progress with school. However, he said that every time he completes something, there is the constant thought that he will ruin things. He mentioned that he has that thought often because he has ruined so many things in the past and he is waiting to be disappointed. Navy briefly discussed that he is still struggling to find the motivation to do things. He also mentioned that others are starting to notice that he is more enthusiastic and his mom specifically is seeing that he is drinking less. Emile said that in order for him to continue feeling better he thinks that being more proactive and completing assignments early would be helpful. Keatyn was also able to recognize music was a coping mechanism for him. He stated that he plans on playing meaningful music in the morning to keep him motivated. Maritza participated in a guided meditation exercise and expressed that it was relaxing however, during the portion about failure and confidence he had a difficult time because he was  thinking about his ex-girlfriend.   Suicidal/Homicidal: Nowithout intent/plan  Therapist Response: Social Work Intern informed Manoah that he needed to renew his orange card. SWI mentioned to [name] that he seemed to appear in a good mood. SWI revisited the initial treatment plan that Teddy established during the first session.  SWI used the solution focused technique of scaling to get an understanding of how Dinesh was feeling this week as opposed to the beginning of counseling. SWI asked Mahith what sort of things have changed since the beginning that have been making him feel a little more positive. SWI asked for more detail about why he felt like he would ruin how good things are going at the moment. SWI asked Steed express how others may have been viewing him lately since things have been going better for him. SWI read a guided imagery script as [name] participated. Social work Theatre manager helped [employer] process how the guided imagery activity felt for him and what came to mind as she read the script. SWI validated Matias's emotions as he communicated that he struggled with the section that prompted him to recall a failure that he had and his lack of confidence.   Plan: Return again in 1 week.  Diagnosis: Axis I: See Hospital Problem List    Axis II: No diagnosis    [name], Student-Social Work 08/23/2016

## 2016-08-26 ENCOUNTER — Encounter: Payer: Self-pay | Admitting: Internal Medicine

## 2016-08-26 ENCOUNTER — Ambulatory Visit (INDEPENDENT_AMBULATORY_CARE_PROVIDER_SITE_OTHER): Payer: Self-pay | Admitting: Internal Medicine

## 2016-08-26 VITALS — BP 122/80 | HR 80 | Resp 12 | Ht 73.5 in | Wt 192.0 lb

## 2016-08-26 DIAGNOSIS — Z7289 Other problems related to lifestyle: Secondary | ICD-10-CM

## 2016-08-26 DIAGNOSIS — G2581 Restless legs syndrome: Secondary | ICD-10-CM

## 2016-08-26 DIAGNOSIS — R1032 Left lower quadrant pain: Secondary | ICD-10-CM

## 2016-08-26 DIAGNOSIS — Z789 Other specified health status: Secondary | ICD-10-CM

## 2016-08-26 DIAGNOSIS — F5104 Psychophysiologic insomnia: Secondary | ICD-10-CM

## 2016-08-26 MED ORDER — GABAPENTIN 100 MG PO CAPS: ORAL_CAPSULE | ORAL | 3 refills | Status: DC

## 2016-08-26 NOTE — Progress Notes (Signed)
   Subjective:    Patient ID: Stephen Miranda, male    DOB: [birth year], 46 y.o.   MRN: [id number]  HPI   1.  Restless legs/Insomnia:  Did not get Gabapentin.  Did not realize he was to pick this up, despite multiple instructions.  Still a big problem to fall asleep.  2.  Alcohol Use:  States he is still using 2 beers and 2 shots of liquor at night.  Thinks he may be able to decrease if he is taking Gabapentin.   3.  Early satiety started in early December or end of November.  Started with improved appetite one week ago.  Has lost a total of 12 lbs per patient, now down 8 lbs from visit in September.   Since eating more, has developed left lower quadrant pain.  Starts about 1 hour after eating.  Having this at least once daily.  More likely after dinner.  Generally, has urgency to make it to bathroom and has a loose stool.  No blood in stool.  No melena.  After BM, has to wait for "cramping pain"  To resolve.   Had this 1 year ago, lasted 2 weeks.  No loss of appetite then. Does still feel like he is under a lot of stress. In school for [employer].  No longer with job--which is a stress as well.  Later, admits to known problem with lactose intolerance.   No outpatient prescriptions have been marked as taking for the 08/26/16 encounter (Office Visit) with Mack Hook, MD.   No Known Allergies    Review of Systems     Objective:   Physical Exam Disheveled, breath smells of ketones mildly.  MM a bit dry Lungs:  CTA CV:  RRR with normal S1 and S2, NO S3, S4 or murmur, radial pulses normal and equal. Abd:  S, NT, No HSM or mass, + BS throughout.         Assessment & Plan:  1.  Restless Legs Syndrome:  Starting over with trial of Gabapentin as never obtained.  2.  Alcohol Abuse:  Continues with counseling.  Still does not seem to think this is an issue.    3.  Depression:  As above  4.  LLQ cramping and weight loss:  Check CBC and CMP.  Likely related to lifestyle, perhaps lactose  intolerance as well as he has been eating dairy without Lactaid and symptoms of anorexia have resolved. Good Rx Coupon if unable to get signed up on [place] card

## 2016-08-26 NOTE — Patient Instructions (Signed)
Start Gabapentin at 1 cap at bedtime for 3 nights.  If well tolerated, increase to 2 caps.  In 3 more nights if well tolerated, increase to 3 caps at bedtime and stay on that dose until you follow up.

## 2016-08-27 LAB — COMPREHENSIVE METABOLIC PANEL
ALBUMIN: 4.4 g/dL (ref 3.5–5.5)
ALK PHOS: 76 IU/L (ref 39–117)
ALT: 139 IU/L — AB (ref 0–44)
AST: 207 IU/L — ABNORMAL HIGH (ref 0–40)
Albumin/Globulin Ratio: 1.5 (ref 1.2–2.2)
BILIRUBIN TOTAL: 0.4 mg/dL (ref 0.0–1.2)
BUN / CREAT RATIO: 10 (ref 9–20)
BUN: 6 mg/dL (ref 6–24)
CHLORIDE: 103 mmol/L (ref 96–106)
CO2: 23 mmol/L (ref 18–29)
Calcium: 9.3 mg/dL (ref 8.7–10.2)
Creatinine, Ser: 0.61 mg/dL — ABNORMAL LOW (ref 0.76–1.27)
GFR calc Af Amer: 140 mL/min/{1.73_m2} (ref >59)
GFR calc non Af Amer: 121 mL/min/{1.73_m2} (ref >59)
GLUCOSE: 91 mg/dL (ref 65–99)
Globulin, Total: 2.9 g/dL (ref 1.5–4.5)
Potassium: 4.5 mmol/L (ref 3.5–5.2)
Sodium: 144 mmol/L (ref 134–144)
Total Protein: 7.3 g/dL (ref 6.0–8.5)

## 2016-08-27 LAB — CBC WITH DIFFERENTIAL/PLATELET
BASOS ABS: 0 10*3/uL (ref 0.0–0.2)
Basos: 1 %
EOS (ABSOLUTE): 0.1 10*3/uL (ref 0.0–0.4)
Eos: 2 %
HEMOGLOBIN: 14.4 g/dL (ref 13.0–17.7)
Hematocrit: 43 % (ref 37.5–51.0)
IMMATURE GRANS (ABS): 0 10*3/uL (ref 0.0–0.1)
Immature Granulocytes: 0 %
LYMPHS ABS: 1.1 10*3/uL (ref 0.7–3.1)
LYMPHS: 28 %
MCH: 31.9 pg (ref 26.6–33.0)
MCHC: 33.5 g/dL (ref 31.5–35.7)
MCV: 95 fL (ref 79–97)
MONOCYTES: 11 %
Monocytes Absolute: 0.4 10*3/uL (ref 0.1–0.9)
Neutrophils Absolute: 2.2 10*3/uL (ref 1.4–7.0)
Neutrophils: 58 %
Platelets: 103 10*3/uL — ABNORMAL LOW (ref 150–379)
RBC: 4.51 x10E6/uL (ref 4.14–5.80)
RDW: 13.5 % (ref 12.3–15.4)
WBC: 3.9 10*3/uL (ref 3.4–10.8)

## 2016-08-27 NOTE — Progress Notes (Signed)
Left message for patient to call the office

## 2016-08-28 NOTE — Progress Notes (Signed)
Patient called and spoke with Dr. Amil Amen on 08/27/16. Patient lab results were given.

## 2016-08-30 ENCOUNTER — Other Ambulatory Visit: Payer: Self-pay | Admitting: Licensed Clinical Social Worker

## 2016-09-03 ENCOUNTER — Ambulatory Visit: Payer: Self-pay | Admitting: Internal Medicine

## 2016-09-06 ENCOUNTER — Encounter: Payer: Self-pay | Admitting: Internal Medicine

## 2016-09-06 ENCOUNTER — Ambulatory Visit (INDEPENDENT_AMBULATORY_CARE_PROVIDER_SITE_OTHER): Payer: Self-pay | Admitting: Internal Medicine

## 2016-09-06 ENCOUNTER — Ambulatory Visit (INDEPENDENT_AMBULATORY_CARE_PROVIDER_SITE_OTHER): Payer: Self-pay | Admitting: Licensed Clinical Social Worker

## 2016-09-06 VITALS — BP 112/70 | HR 80 | Resp 12 | Ht 73.5 in | Wt 190.0 lb

## 2016-09-06 DIAGNOSIS — G2581 Restless legs syndrome: Secondary | ICD-10-CM

## 2016-09-06 DIAGNOSIS — G4700 Insomnia, unspecified: Secondary | ICD-10-CM

## 2016-09-06 DIAGNOSIS — F330 Major depressive disorder, recurrent, mild: Secondary | ICD-10-CM

## 2016-09-06 DIAGNOSIS — F1010 Alcohol abuse, uncomplicated: Secondary | ICD-10-CM

## 2016-09-06 DIAGNOSIS — F5104 Psychophysiologic insomnia: Secondary | ICD-10-CM

## 2016-09-06 DIAGNOSIS — F411 Generalized anxiety disorder: Secondary | ICD-10-CM

## 2016-09-06 DIAGNOSIS — R748 Abnormal levels of other serum enzymes: Secondary | ICD-10-CM

## 2016-09-06 MED ORDER — GABAPENTIN 100 MG PO CAPS: ORAL_CAPSULE | ORAL | 3 refills | Status: DC

## 2016-09-06 NOTE — Progress Notes (Signed)
   THERAPY PROGRESS NOTE  Session Time: 15 minutes  Participation Level: Minimal  Behavioral Response: GuardedAlertIrritable  Type of Therapy: Individual Therapy  Treatment Goals addressed: Coping  Interventions: Motivational Interviewing and Solution Focused  Summary: Stephen Miranda is a 46 y.o. male who presents with anxious mood and appropriate affect. Emani stated that his last drink was the day of his last appointment with the Dr and he has not drank since. He said that he felt fine and he was just catching up on school work. Brysyn says that sometime he just has bad days and he can manage. Trevious mentioned that there was nothing that he wanted to work on at this point because nothing was bothering him. He said that he thinks that now would be a good time to end counseling because he feels that he can handle things on his own.   Suicidal/Homicidal: Nowithout intent/plan  Therapist Response:  Social work intern asked Crosby how his appointment with Dr. Amil Amen went and mentioned that his drinking seemed to be affecting his health. Social work Theatre manager asked asked about how he has been managing his drinking. SWI asked Deauntae about how thinks have been going since the last session two weeks ago. SWI mentioned that since she is a student things are going to be winding down. SWI asked Ryn about he felt about his progress and any goals that wanted to continue working on. SWI utilized supportive counseling techniques to validate his emotions regarding wanting end counseling.   Plan: Client Terminated  Diagnosis: Axis I: See Hospital Problem List    Axis II: No diagnosis    [name], Student-Social Work 09/06/2016

## 2016-09-06 NOTE — Progress Notes (Signed)
   Subjective:    Patient ID: Stephen Miranda, male    DOB: 1971/04/23, 46 y.o.   MRN: [id number]  HPI   1.  Restless Legs:  Is increasing Gabapentin to 300 mg at bedtime tonight.  Notes a bit decrease in the restless legs.  Did stop drinking alcohol the night I spoke with him about liver enzymes about 1.5 weeks ago.    2.  Insomnia:  Not sleeping well, but no alcohol withdrawal symptoms.  3.  Alcohol abuse with elevated transaminases:  Called patient about 1 1/2 weeks ago to notify him of elevated liver enzymes in a pattern suggesting secondary to alcohol use.  He states he stopped alcohol use as above.   Current Meds  Medication Sig  . gabapentin (NEURONTIN) 100 MG capsule Titrate to 6 caps by mouth at bedtime nightly  . [DISCONTINUED] gabapentin (NEURONTIN) 100 MG capsule 3 caps by mouth at bedtime nightly   No Known Allergies   Review of Systems     Objective:   Physical Exam   Much less disheveled appearing today. Lungs:  CTA CV:  RRR without murmur or rub, radial pulses normal and equal Abd:  S, NT, No HSM or mass, + BS LE:  No edema Neuro exam without change.        Assessment & Plan:  1.  Restless legs:  Improved with Gabapentin.  Discussed if he does not see enough improvement in 1 week after increasing to 300 mg, to start titration every week by 100 mg to max of 600 mg at bedtime.  To call if any problems  2.  Insomnia:  See above.  3.  Alcohol abuse:  Does look better today.  Reportedly has stopped use .

## 2016-09-08 ENCOUNTER — Encounter: Payer: Self-pay | Admitting: Internal Medicine

## 2016-09-18 NOTE — Addendum Note (Signed)
Addended by: Metta Clines on: 09/18/2016 12:15 PM   Modules accepted: Level of Service

## 2016-10-17 ENCOUNTER — Ambulatory Visit (INDEPENDENT_AMBULATORY_CARE_PROVIDER_SITE_OTHER): Payer: Self-pay | Admitting: Licensed Clinical Social Worker

## 2016-10-17 DIAGNOSIS — F330 Major depressive disorder, recurrent, mild: Secondary | ICD-10-CM

## 2016-10-17 DIAGNOSIS — F411 Generalized anxiety disorder: Secondary | ICD-10-CM

## 2016-10-17 NOTE — Progress Notes (Signed)
   THERAPY PROGRESS NOTE  Session Time: 60 minutes  Participation Level: Active  Behavioral Response: DisheveledAlertIrritable  Type of Therapy: Individual Therapy  Treatment Goals addressed: Anxiety  Interventions: CBT  Summary: Stephen Miranda is a 46 y.o. male who presents with irritable mood and appropriate affect. Stephen Miranda stated that the past month has been very rough for him with school, anticipation of debt, and living with his parents. He explained that he has gotten to the place again, where he feels stuck and doesn't have the motivation to do anything. Stephen Miranda also mentioned a strange dream he had in which his childhood toys visited him and told him that they were not coming back. He asked if it had any relation to his ex-girl friend blocking him on facebook. Stephen Miranda said that he was disappointed in himself because he recently had a drink for the first time in over 40 days. Stephen Miranda began to consider the connection to his ex-girlfriend and his drinking habits. Stephen Miranda reflected back on previous difficult situations and how he processed them. He realized that he does not take the time to emotionally process difficult events when they happen, but rather moves on to something else to prevent himself from becoming depressed like he has in the past. Stephen Miranda used the cognitive behavioral model to process his thought, feelings, and behavior in relation to the breakup with his girlfriend as well as when she recently blocked him. He said that using this tool would be helpful for him and he will try to use it often. He also stated that he initially expected that things would be much better after the session but he still feels like it was helpful because at least he is doing something to work on his emotions. Stephen Miranda mentioned that he would like to continue using CBT to practice processing his reactions to difficult events and increasing motivation.   Suicidal/Homicidal: Nowithout intent/plan  Therapist Response: Social Work  Intern asked Stephen Miranda how things have been going in the last month. SWI asked what has changed since the last session when he stated that he no longer needed counseling. SWI helped Stephen Miranda process the connection between his drinking and his breakup. SWI used supportive counseling techniques such as normalizing when Stephen Miranda spoke of how he often deals with difficult situations. SWI explained cognitive behavioral therapy to Stephen Miranda and worked used past experiences he had as examples. SWI reminded Stephen Miranda that there were only a few sessions left and asked if he was interested in continuing after we finished the remainder sessions. SWI revisited the treatment plan to focus on concrete goals.   Plan: Return again in 1 weeks.  Diagnosis: Axis I: See Hospital Problem List    Axis II: No diagnosis    [name], Student-Social Work 10/17/2016

## 2016-10-25 ENCOUNTER — Ambulatory Visit (INDEPENDENT_AMBULATORY_CARE_PROVIDER_SITE_OTHER): Payer: Self-pay | Admitting: Licensed Clinical Social Worker

## 2016-10-25 DIAGNOSIS — F411 Generalized anxiety disorder: Secondary | ICD-10-CM

## 2016-10-25 DIAGNOSIS — F330 Major depressive disorder, recurrent, mild: Secondary | ICD-10-CM

## 2016-10-25 NOTE — Progress Notes (Signed)
   THERAPY PROGRESS NOTE  Session Time: 60 minutes  Participation Level: Active  Behavioral Response: NeatAlertEuthymic  Type of Therapy: Individual Therapy  Treatment Goals addressed: Diagnosis: Anxiety and Depression  Interventions: CBT and Solution Focused  Summary: Stephen Miranda is a 46 y.o. male who presents with euthymic mood and appropriate affect. Stephen Miranda began the session by asking the Education officer, museum when does she know when someone has hit rock bottom. Stephen Miranda explained that he was feeling extremely low and and feels that he cannot get anywhere. He mentioned that in the span of two days he has went from feeling great because of his progress in school but right after he came crashing down. Stephen Miranda mentioned that he likes to leave whatever place is causing him a lot of stress and that it is often helpful. Stephen Miranda was very tearful when he started to think about how he needed affirmations from other people to feel good. He stated that he was longing for someone to encourage him when times are difficult for him but it would be more meaningful if they came from someone he had a strong bond with. Stephen Miranda discussed how difficult it was to develop new relationships at his age. Stephen Miranda identified some of his negative core beliefs such as being incompetent and being unwanted. Although he was able to identify negative core beliefs he also gave example of positive core beliefs of himself. Stephen Miranda mentioned that he would like to continuing counseling throughout the summer.   Suicidal/Homicidal: Nowithout intent/plan  Therapist Response: Social Work Intern asked Stephen Miranda how he would identify hitting rock bottom. SWI used solution focused techniques such as scaling, identifying past solutions, and praising to acknowledge his issue with motivation. SWI used supportive counseling techniques to validate his feelings as he became tearful about not having the encouragement that he needs. SWI inquired more about how Stephen Miranda forms  relationships and what are the difficulties that he faces when trying to develop bonds. SWI used the CBT tool Identifying Negative Core Beliefs to get a better understanding of Stephen Miranda's self esteem. SWI informed gave Stephen Miranda the option of continuing counseling throughout the summer.   Plan: Return again in 1 week.  Diagnosis: Axis I: See Hospital Problem List    Axis II: No diagnosis    Stephen Miranda, Student-Social Work 10/25/2016

## 2016-11-01 ENCOUNTER — Ambulatory Visit (INDEPENDENT_AMBULATORY_CARE_PROVIDER_SITE_OTHER): Payer: Self-pay | Admitting: Licensed Clinical Social Worker

## 2016-11-01 DIAGNOSIS — F411 Generalized anxiety disorder: Secondary | ICD-10-CM

## 2016-11-01 DIAGNOSIS — F330 Major depressive disorder, recurrent, mild: Secondary | ICD-10-CM

## 2016-11-01 NOTE — Progress Notes (Signed)
   THERAPY PROGRESS NOTE  Session Time: 60 minutes  Participation Level: Active  Behavioral Response: DisheveledAlertDysphoric  Type of Therapy: Individual Therapy  Treatment Goals addressed: Anxiety  Interventions: CBT  Summary: Stephen Miranda is a 46 y.o. male who presents with dysphoric mood and appropriate affect. [name] mentioned that he was very concerned with his mood lately and that he may need to enter an institution to have someone watch over him because he feels like he is losing control. He mentioned that he is still feeling unmotivated. [name] mentioned that he has been thinking about death but he is not suicidal. [name] mentioned no intentions or plan but he did mention an incident where he was drunk and felt like he was dying and has been thinking about it a lot. He shared that he believed that at one point he had a 'manic episode" in which he felt very happy and was a little more motivated than usual. [name] stated that one of the more difficult situations he has been dealing with lately is that he leaves NA and then has a drink, leaving him disappointed in himself.  [name] identified one automatic thought which was "I am a failure". [name] was able to use CBT to identify his feelings and behaviors linked to that automatic negative thought. [name] also listed some of the evidence that proves this automatic thoughts wrong such as knowing that he could be loved and that he has gotten to this point in his education. [name] shared that he would continue looking for a sponsor in his NA group and is interested in trying the support group at the [facility].   Suicidal/Homicidal: Nowithout intent/plan  Therapist Response: Social Work Intern completed a risk assessment with [name] to assess suicidality. SWI asked for clarification on [name]'s perception of a manic episode. SWI explained that often when a person is persistently depressed, when their mood is lifted even just the slightest bit it may feel like  mania. SWI asked about what has happened recently that has made him feel worse. SWI helped [name] process his emotions regarding drinking after NA using CBT. SWI encouraged [name] to provide evidence that he was a failure and evidence that proved that belief wrong. SWI reminded [name] that feelings are not facts and that just because he feels that he failed at something does not mean that he is a failure as a person. SWI provided information about a support group through the [facility].   Plan: Return again in 1 week.  Diagnosis: Axis I: See Hospital Problem List    Axis II: No diagnosis    [name], Student-Social Work 11/01/2016

## 2016-11-08 ENCOUNTER — Ambulatory Visit: Payer: Self-pay | Admitting: Internal Medicine

## 2017-01-10 ENCOUNTER — Ambulatory Visit (INDEPENDENT_AMBULATORY_CARE_PROVIDER_SITE_OTHER): Payer: Self-pay | Admitting: Licensed Clinical Social Worker

## 2017-01-10 DIAGNOSIS — F330 Major depressive disorder, recurrent, mild: Secondary | ICD-10-CM

## 2017-01-10 DIAGNOSIS — F411 Generalized anxiety disorder: Secondary | ICD-10-CM

## 2017-01-10 NOTE — Progress Notes (Signed)
   THERAPY PROGRESS NOTE  Session Time: 45 minutes  Participation Level: Active  Behavioral Response: NeatAlertEuthymic  Type of Therapy: Individual Therapy  Treatment Goals addressed: Coping  Interventions: CBT and Solution Focused  Summary: Sy Saintjean is a 46 y.o. male who presents with euthymic mood and appropriate affect. 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 He also stated that he is worried about paying for school because he has no income at this point. He informed LCSWA that NA has been going well for him and he has not had a drink in a little over 40 days. He mentioned that it is difficult for him at night because he is so used to drinking right before bed. Nilan mentioned that he has had a lot of trouble dealing with being so close to finishing school but still trying to get things done. He stated that he is struggling to stay motivated at this point and he is unsure why he is having such a hard time when he is so close to the end. Maycen processed his thoughts, feelings, and behaviors connected to arriving at the end of his program in school. Ramces mentioned that some of the thoughts he has is that "it's no big deal" because the classes are not difficult, as well as so much being at stake if he does not do well in his current classes. He explored his feeling which were panic, frustration, fear and regret. He explored the fact that he feels regretful and fearful because he is still unsure that the field he is in school for is something that he is truly interested in. The behaviors that Briant recalled were procrastinating, and internalizing the feelings attached to  Not getting to the next step in his career. Jarris processed more about his drinking habits in the past as well as his motivation to drink. He came to the conclusion that because he is unable to reward himself in any other way he just chooses to  reward himself with alcohol. Leamon stated that he has gotten to a point where he realizes that he cannot control every situation but he can have control over his reaction. He requested that he has sessions every other week from now on because he feels that things have gotten better for him. Caidence recalled the incident in which he brought alcohol to work with his lunch. Ulys identified specific skill building to work on in the next sessions such as using cbt to process difficult situations, developing better nighttime habits, and exploring more of his urge to drink.    Suicidal/Homicidal: Nowithout intent/plan  Therapist Response: LCSWA checked in which Thayer about how he has been since the last session. LCSWA used CBT to assist Arliss in processing the fact that his program is ending. LCSWA used supportive counseling techniques as Duffy discussed his fears about the future and other feelings regarding such a major stage in his life. LCSWA used Solution focused techniques to explore how he has found motivation in the past. LCSWA processed how he feels about where he is in the counseling process and what steps he wants to take from this point.    Plan: Return again in 2 weeks.  Diagnosis: Axis I: See Hospital Problem List    Axis II: No diagnosis    [name], Student-Social Work 01/10/2017

## 2017-01-24 ENCOUNTER — Ambulatory Visit (INDEPENDENT_AMBULATORY_CARE_PROVIDER_SITE_OTHER): Payer: Self-pay | Admitting: Licensed Clinical Social Worker

## 2017-01-24 DIAGNOSIS — F411 Generalized anxiety disorder: Secondary | ICD-10-CM

## 2017-01-24 DIAGNOSIS — F330 Major depressive disorder, recurrent, mild: Secondary | ICD-10-CM

## 2017-01-27 NOTE — Progress Notes (Signed)
   THERAPY PROGRESS NOTE  Session Time: 35  Participation Level: Active  Behavioral Response: CasualAlertEuthymic  Type of Therapy: Individual Therapy  Treatment Goals addressed: Coping   Interventions: Solution Focused  Summary: Azarion Hove is a 46 y.o. male who presents with euthymic mood and appropriate affect. Anant stated that thing shave become very difficult for him and he has been procrastinating a lot more during his week off. Dorin mentioned that he is continuing to struggle to find motivation for anything. Landon shared that within the past week he did have a drink one night due to insomnia. He explained that he prefers to attend [employer] rather than Alcoholics [employer] because there is too much of a religious influence and he feels a lot of pressure. 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 Bodin was able to understand that he is not motivated because he does not see the value of this particular field he is going into because it is not his passion. Khaden was unable to identify another way to seek enjoyment through art because he continuously sees finances as a barrier. Kevaughn mentioned that he would like to express himself artistically while also using political commentary but he fears that he will not get a positive reaction. 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 He began to become very deep in thought and asking himself "when did I choose safety over fun?". Torell practiced a mindfulness exercise after he became overwhelmed with thinking about the previous question.  Suicidal/Homicidal: Nowithout intent/plan  Therapist Response: LCSWA used solution focused technique of asking for past solutions to difficulties with motivation. LCSWA asked for further clarification on his last alcoholic drink. LCSWA asked if Aran has every tried AA rather than NA. 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 LCSWA practiced a grounding exercise after Thornton became overwhelmed.   Plan: Return again in 2 weeks.  Diagnosis: Axis I: See Hospital Problem List    Axis II: No diagnosis    [name], Student-Social Work 01/27/2017

## 2017-02-07 ENCOUNTER — Ambulatory Visit (INDEPENDENT_AMBULATORY_CARE_PROVIDER_SITE_OTHER): Payer: Self-pay | Admitting: Licensed Clinical Social Worker

## 2017-02-07 DIAGNOSIS — F330 Major depressive disorder, recurrent, mild: Secondary | ICD-10-CM

## 2017-02-07 DIAGNOSIS — F411 Generalized anxiety disorder: Secondary | ICD-10-CM

## 2017-02-07 NOTE — Progress Notes (Signed)
   THERAPY PROGRESS NOTE  Session Time: 60 minutes  Participation Level: Active  Behavioral Response: CasualAlertEuthymic  Type of Therapy: Individual Therapy  Treatment Goals addressed: Coping  Interventions: Solution Focused and Supportive  Summary: Stephen Miranda is a 46 y.o. male who presents with Euthymic mood and appropriate affect. Stephen Miranda scored  13 on the Gad-7 screener and reported that he was anxious, restless, irritable, and fearful that something awful may happen nearly everyday. Sony also scored a 13 on the PHQ-9 and reporting that he had little interest or pleasure in doing things, having little energy, a poor appetite, and difficulties concentrating nearly everyday. Stephen Miranda stated that he has now completed his in class courses and will now begin online courses. 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 He revisited the question from last session which was "when did I choose safety over fun?"  He said that he has not given the question much thought since the last session but now he is considering the fact that his education path at this point was may be a mistake. He did however, state that the profession he is now in would be the safer option for job security. Stephen Miranda stated that he chose the safe route which was not necessarily the easiest path. Stephen Miranda mentioned that he he was currently tired and and was having a difficult time focusing because he did not give himself time to rest after classes ended. Armas practiced the "finding your authentic self" guided meditation. Stephen Miranda explained that he felt less tense and he knew that his current career path was not his authentic self. 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 Stephen Miranda mentioned Chief Technology Officer as a a way to participate in something his was interested in as well as earn extra money as a side business. Stephen Miranda stated that he is  comfortable with the fact that there will only be two sessions left and he just wants to get past this stage of his education which impacts his motivation.   Suicidal/Homicidal: Nowithout intent/plan  Therapist Response: LCSWA completed the PHQ-9 and GAD-7 Screeners with Stephen Miranda. LCSWA revisited the question that Stephen Miranda brought up at the end of the last session regarding safety vs fun. LCSWA used solution therapy techniques by asking Latrel what his desires were in the past and and the moment in which he felt that he was taking a safer route. LCSWA read the guided imagery script to help Stephen Miranda become less tense as well as connect his authentic self to his career goals. LCSWA used supportive counseling techniques to validate Stephen Miranda emotions as he discussed his thoughts on the guided meditation. LCSWA reminded Stephen Miranda that termination would be soon and gauge where he was with only having a few sessions left. LCSWA asked Abron what specific thing he would like to focus on in the final session.   Plan: Return again in 2 weeks.  Diagnosis: Axis I: See Hospital Problem List    Axis II: No diagnosis    Stephen Miranda, Student-Social Work 02/07/2017

## 2017-02-21 ENCOUNTER — Ambulatory Visit (INDEPENDENT_AMBULATORY_CARE_PROVIDER_SITE_OTHER): Payer: Self-pay | Admitting: Licensed Clinical Social Worker

## 2017-02-21 DIAGNOSIS — F411 Generalized anxiety disorder: Secondary | ICD-10-CM

## 2017-02-21 DIAGNOSIS — F330 Major depressive disorder, recurrent, mild: Secondary | ICD-10-CM

## 2017-02-24 NOTE — Progress Notes (Signed)
   THERAPY PROGRESS NOTE  Session Time: 60 minutes  Participation Level: Active  Behavioral Response: CasualAlertEuthymic  Type of Therapy: Individual Therapy  Treatment Goals addressed: Coping  Interventions: Solution Focused and Supportive  Summary: Stephen Miranda is a 46 y.o. male who presents with euthymic mood and appropriate affect. Stephen Miranda stated that he was still struggling to stay motivated to focus on his school work and he finds himself procrastinating quite often. Stephen Miranda mentioned that he is constantly wondering if hs current program was worth it because he is unsure if he will actually enjoy the field he is entering. Stephen Miranda mentioned that he began counseling because during the time that he begin was when he was fired from his job because of alcohol and he was clearly depressed. He explained that during counseling he was able to get a better sense of why things were not going well in his life although he was still anxious in regard to his education and finding a job. Stephen Miranda mentioned that he feels fine with counseling ending but he is still worried about the future. He stated that if a difficult situation arises he can take time to think about the situation before reacting or think about how he feels in the moment. Stephen Miranda began to connect the fact that he is unmotivated and procrastinating to the fact that the program he is in is not his passion. Stephen Miranda stated that he is interested in medication for his depression and that he can handle his anxiety. He mentioned that his still feels down nearly every day.   Suicidal/Homicidal: Nowithout intent/plan  Therapist Response: LCSWA checked in on how things were going with Stephen Miranda since the last session. LCSWA used motivational interviewing to get a better idea of what it is like when he is procrastinating and what he has been trying recently. LCSWA asked Stephen Miranda about his journey in counseling and what were his feelings surrounding counseling ending for him. LCSWA  asked Stephen Miranda what would he do if challenges arise in the future. LCSWA used supportive counseling techniques to encourage Stephen Miranda since he has came a long way in counseling with processing his emotions. LCSWA used solution focused techniques to connect his lack of motivation currently by asking him to plan out how he would pursue photography. LCSWA brought Stephen Miranda's attention to the fact that he is not motivated at the moment because the medical field was not his passion. LCSWA asked Stephen Miranda for clarification on what urged him to want medication for his depression.   Plan: Return again in 0 weeks. Termination Session.   Diagnosis: Axis I: See Hospital Problem List    Axis II: No diagnosis    [name], Student-Social Work 02/24/2017

## 2018-05-27 ENCOUNTER — Ambulatory Visit (HOSPITAL_COMMUNITY)
Admission: EM | Admit: 2018-05-27 | Discharge: 2018-05-27 | Disposition: A | Discharge status: Home / Self Care | Payer: Self-pay | Attending: Family Medicine | Admitting: Family Medicine

## 2018-05-27 ENCOUNTER — Encounter (HOSPITAL_COMMUNITY): Payer: Self-pay

## 2018-05-27 ENCOUNTER — Ambulatory Visit (HOSPITAL_COMMUNITY)
Admission: EM | Admit: 2018-05-27 | Discharge: 2018-05-27 | Discharge status: Absent without leave | Payer: No Typology Code available for payment source

## 2018-05-27 DIAGNOSIS — J209 Acute bronchitis, unspecified: Secondary | ICD-10-CM

## 2018-05-27 DIAGNOSIS — R05 Cough: Secondary | ICD-10-CM

## 2018-05-27 DIAGNOSIS — F17200 Nicotine dependence, unspecified, uncomplicated: Secondary | ICD-10-CM

## 2018-05-27 DIAGNOSIS — R5383 Other fatigue: Secondary | ICD-10-CM

## 2018-05-27 DIAGNOSIS — R42 Dizziness and giddiness: Secondary | ICD-10-CM

## 2018-05-27 LAB — POCT I-STAT, CHEM 8
BUN: 5 mg/dL — AB (ref 6–20)
CALCIUM ION: 0.97 mmol/L — AB (ref 1.15–1.40)
CHLORIDE: 101 mmol/L (ref 98–111)
Creatinine, Ser: 1.2 mg/dL (ref 0.61–1.24)
Glucose, Bld: 101 mg/dL — ABNORMAL HIGH (ref 70–99)
HEMATOCRIT: 48 % (ref 39.0–52.0)
Hemoglobin: 16.3 g/dL (ref 13.0–17.0)
Potassium: 3.8 mmol/L (ref 3.5–5.1)
SODIUM: 139 mmol/L (ref 135–145)
TCO2: 26 mmol/L (ref 22–32)

## 2018-05-27 MED ORDER — AZITHROMYCIN 250 MG PO TABS: 250.0000 mg | ORAL_TABLET | Freq: Every day | ORAL | 0 refills | Status: DC

## 2018-05-27 NOTE — Discharge Instructions (Signed)
Blood work did not show anemia  Declines chest x-ray Get plenty of rest and push fluids Use OTC medication as needed for symptomatic relief Take antibiotic as directed and to completion Follow up with PCP next week for reevaluation and/or if symptoms persists Please follow up with PCP to start chantix as it requires close follow-up Return or go to ER if you have any new or worsening symptoms chest pain, shortness of breath, dizziness, fatigue, lightheadedness, fainting, slurred speech, weakness, nausea, vomiting, abdominal pain, changes in bowel or bladder habits, etc..Marland Kitchen

## 2018-05-27 NOTE — ED Provider Notes (Signed)
[place]   [id number] 05/27/18 Arrival Time: 7628  CC: Cough and dizziness  SUBJECTIVE:  Stephen Miranda is a 47 y.o. male hx significant for tobacco use (1/2 PPD x 20 years) who presents with productive cough x 1 week.  Denies positive sick exposure or precipitating event, but works as a Technical brewer.  Describes cough as intermittent and productive unsure of sputum color.  Has tried OTC medications like advil without relief.  Denies aggravating factors. Reports previous symptoms in the past.  Patient complains of feeling fatigued and rhinorrhea.  Denies fever, chills, sinus pain, rhinorrhea, sore throat, SOB, wheezing, chest pain, nausea, changes in bowel or bladder habits.    Has not had flu shot this year  Patient also mentions hx of intermittent dizziness x 1 week, denies symptoms currently.  Currently with URI symptoms and unsure if they are related.  States dizziness does not feel like the room is spinning, but as if he needs to sit down. States episodes are intermittent and last for approximately 5 minutes at a time.  Denies alleviating factors.  Worse with smoking and drinking. Denies fever, chills, nausea, vomiting, hearing changes, tinnitus, ear pain, chest pain, syncope, SOB, weakness, slurred speech, memory or emotional changes, facial drooping/ asymmetry, incoordination, numbness or tingling, abdominal pain, changes in bowel or bladder habits.    ROS: As per HPI.  Past Medical History:  Diagnosis Date  . Chronic insomnia 05/23/2016   Has stated in past since age 82 yo, but worse in recent years with restless legs.  . Elevated liver enzymes 04/24/2015   04/18/2015:  AST:  115     ALT:  90. Likely due to alcohol intake  . 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 Sarajane Jews, [facility]  . Restless legs 05/23/2016  . Seborrheic dermatitis 05/23/2016   Past Surgical History:  Procedure Laterality Date  . Excision Malignant Melanoma Left  02/07/2015   Left upper back   No Known Allergies No current facility-administered medications on file prior to encounter.    Current Outpatient Medications on File Prior to Encounter  Medication Sig Dispense Refill  . gabapentin (NEURONTIN) 100 MG capsule Titrate to 6 caps by mouth at bedtime nightly 120 capsule 3    Social History   Socioeconomic History  . Marital status: Single    Spouse name: Not on file  . Number of children: 0  . 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 Highest education level: Not on file  Occupational History  . Occupation: Works at [employer]: Previously worked at [employer]  Social Needs  . Financial resource strain: Not on file  . Food insecurity:    Worry: Not on file    Inability: Not on file  . Transportation needs:    Medical: Not on file    Non-medical: Not on file  Tobacco Use  . Smoking status: Current Every Day Smoker    Packs/day: 0.50    Years: 20.00    Pack years: 10.00    Types: Cigarettes  . Smokeless tobacco: Never Used  . Tobacco comment: prescribed Chantix in past, but did not take  Substance and Sexual Activity  . Alcohol use: Yes    Alcohol/week: 4.0 standard drinks    Types: 2 Cans of beer, 2 Shots of liquor per week    Comment: Nightly  . Drug use: No  . Sexual activity:  Not on file  Lifestyle  . Physical activity:    Days per week: Not on file    Minutes per session: Not on file  . Stress: Not on file  Relationships  . Social connections:    Talks on phone: Not on file    Gets together: Not on file    Attends religious service: Not on file    Active member of club or organization: Not on file    Attends meetings of clubs or organizations: Not on file    Relationship status: Not on file  . Intimate partner violence:    Fear of current or ex partner: Not on file    Emotionally abused: Not on file    Physically abused: Not on file    Forced sexual activity: Not  on file  Other Topics Concern  . Not on file  Social History Narrative   Currently lives with parents   Family History  Problem Relation Age of Onset  . Fibromyalgia Mother      OBJECTIVE:  Vitals:   05/27/18 1411 05/27/18 1536  BP: (!) 151/93 119/84  Pulse: (!) 101 97  Resp: 20 18  Temp: 98 F (36.7 C)   SpO2: 96% 97%     General appearance: Alert; appears fatigued, but nontoxic HEENT: NCAT; Ears: EACs clear, TMs pearly gray; Eyes: PERRL.  EOM grossly intact.  Sinuses nontender; Nose: mild clear rhinorrhea; tonsils nonerythematous, uvula midline  Neck: supple without LAD Lungs: clear to auscultation bilaterally without adventitious breath sounds Heart: regular rate and rhythm.  Radial pulses 2+ symmetrical bilaterally Neuro: CN 2-12 intact; strength and sensation intact about the upper and lower extremities; finger to nose without difficulty; negative pronator drift Skin: warm and dry Psychological: alert and cooperative; normal mood and affect  Results for orders placed or performed during the hospital encounter of 05/27/18 (from the past 24 hour(s))  I-STAT, chem 8     Status: Abnormal   Collection Time: 05/27/18  2:47 PM  Result Value Ref Range   Sodium 139 135 - 145 mmol/L   Potassium 3.8 3.5 - 5.1 mmol/L   Chloride 101 98 - 111 mmol/L   BUN 5 (L) 6 - 20 mg/dL   Creatinine, Ser 1.20 0.61 - 1.24 mg/dL   Glucose, Bld 101 (H) 70 - 99 mg/dL   Calcium, Ion 0.97 (L) 1.15 - 1.40 mmol/L   TCO2 26 22 - 32 mmol/L   Hemoglobin 16.3 13.0 - 17.0 g/dL   HCT 48.0 39.0 - 52.0 %    ASSESSMENT & PLAN:  1. Acute bronchitis, unspecified organism   2. Other fatigue   3. Dizziness   4. Tobacco use disorder     Meds ordered this encounter  Medications  . azithromycin (ZITHROMAX) 250 MG tablet    Sig: Take 1 tablet (250 mg total) by mouth daily. Take first 2 tablets together, then 1 every day until finished.    Dispense:  6 tablet    Refill:  0    Order Specific Question:    Supervising Provider    Answer:   Wynona Luna [[id number]]   Blood work did not show anemia.   Declines chest x-ray Get plenty of rest and push fluids Use OTC medication as needed for symptomatic relief Take antibiotic as directed and to completion Follow up with PCP next week for reevaluation (of cough and dizziness - emphasized with pt dizziness may be associated with URI, but if it persists should follow up  with PCP with strict return and ED precautions) and/or if symptoms persists Please follow up with PCP to start chantix as it requires close follow-up Return or go to ER if you have any new or worsening symptoms chest pain, shortness of breath, dizziness, fatigue, lightheadedness, fainting, slurred speech, weakness, nausea, vomiting, abdominal pain, changes in bowel or bladder habits, etc...  Reviewed expectations re: course of current medical issues. Questions answered. Outlined signs and symptoms indicating need for more acute intervention. Patient verbalized understanding. After Visit Summary given.          Lestine Box, PA-C 05/27/18 1948

## 2018-05-27 NOTE — ED Triage Notes (Signed)
Pt presents with suspicions of anemia; has been feeling fatigue, weak and dizzy.

## 2019-05-19 ENCOUNTER — Encounter (HOSPITAL_COMMUNITY): Payer: Self-pay

## 2019-05-19 ENCOUNTER — Other Ambulatory Visit: Payer: Self-pay

## 2019-05-19 ENCOUNTER — Emergency Department (HOSPITAL_COMMUNITY): Payer: Self-pay

## 2019-05-19 ENCOUNTER — Inpatient Hospital Stay (HOSPITAL_COMMUNITY)
Admission: EM | Admit: 2019-05-19 | Discharge: 2019-05-22 | DRG: 432 | Disposition: A | Discharge status: Home / Self Care | Payer: Self-pay | Attending: Internal Medicine | Admitting: Internal Medicine

## 2019-05-19 DIAGNOSIS — R945 Abnormal results of liver function studies: Secondary | ICD-10-CM

## 2019-05-19 DIAGNOSIS — D689 Coagulation defect, unspecified: Secondary | ICD-10-CM | POA: Diagnosis present

## 2019-05-19 DIAGNOSIS — K3189 Other diseases of stomach and duodenum: Secondary | ICD-10-CM | POA: Diagnosis present

## 2019-05-19 DIAGNOSIS — I8511 Secondary esophageal varices with bleeding: Secondary | ICD-10-CM | POA: Diagnosis present

## 2019-05-19 DIAGNOSIS — R7989 Other specified abnormal findings of blood chemistry: Secondary | ICD-10-CM

## 2019-05-19 DIAGNOSIS — Z720 Tobacco use: Secondary | ICD-10-CM | POA: Diagnosis present

## 2019-05-19 DIAGNOSIS — F17210 Nicotine dependence, cigarettes, uncomplicated: Secondary | ICD-10-CM | POA: Diagnosis present

## 2019-05-19 DIAGNOSIS — I864 Gastric varices: Secondary | ICD-10-CM | POA: Diagnosis present

## 2019-05-19 DIAGNOSIS — K2270 Barrett's esophagus without dysplasia: Secondary | ICD-10-CM | POA: Diagnosis present

## 2019-05-19 DIAGNOSIS — R748 Abnormal levels of other serum enzymes: Secondary | ICD-10-CM | POA: Diagnosis present

## 2019-05-19 DIAGNOSIS — Z85820 Personal history of malignant melanoma of skin: Secondary | ICD-10-CM

## 2019-05-19 DIAGNOSIS — D62 Acute posthemorrhagic anemia: Secondary | ICD-10-CM

## 2019-05-19 DIAGNOSIS — D539 Nutritional anemia, unspecified: Secondary | ICD-10-CM | POA: Diagnosis present

## 2019-05-19 DIAGNOSIS — F1011 Alcohol abuse, in remission: Secondary | ICD-10-CM | POA: Diagnosis present

## 2019-05-19 DIAGNOSIS — E538 Deficiency of other specified B group vitamins: Secondary | ICD-10-CM

## 2019-05-19 DIAGNOSIS — K449 Diaphragmatic hernia without obstruction or gangrene: Secondary | ICD-10-CM | POA: Diagnosis present

## 2019-05-19 DIAGNOSIS — Z20828 Contact with and (suspected) exposure to other viral communicable diseases: Secondary | ICD-10-CM | POA: Diagnosis present

## 2019-05-19 DIAGNOSIS — D696 Thrombocytopenia, unspecified: Secondary | ICD-10-CM | POA: Diagnosis present

## 2019-05-19 DIAGNOSIS — F1010 Alcohol abuse, uncomplicated: Secondary | ICD-10-CM

## 2019-05-19 DIAGNOSIS — D7589 Other specified diseases of blood and blood-forming organs: Secondary | ICD-10-CM | POA: Diagnosis present

## 2019-05-19 DIAGNOSIS — K7031 Alcoholic cirrhosis of liver with ascites: Principal | ICD-10-CM | POA: Diagnosis present

## 2019-05-19 DIAGNOSIS — I493 Ventricular premature depolarization: Secondary | ICD-10-CM | POA: Diagnosis present

## 2019-05-19 DIAGNOSIS — K922 Gastrointestinal hemorrhage, unspecified: Secondary | ICD-10-CM | POA: Diagnosis present

## 2019-05-19 DIAGNOSIS — D61818 Other pancytopenia: Secondary | ICD-10-CM | POA: Diagnosis present

## 2019-05-19 DIAGNOSIS — K766 Portal hypertension: Secondary | ICD-10-CM | POA: Diagnosis present

## 2019-05-19 DIAGNOSIS — Z20822 Contact with and (suspected) exposure to covid-19: Secondary | ICD-10-CM

## 2019-05-19 LAB — COMPREHENSIVE METABOLIC PANEL
ALT: 29 U/L (ref 0–44)
AST: 97 U/L — ABNORMAL HIGH (ref 15–41)
Albumin: 3.1 g/dL — ABNORMAL LOW (ref 3.5–5.0)
Alkaline Phosphatase: 63 U/L (ref 38–126)
Anion gap: 22 — ABNORMAL HIGH (ref 5–15)
BUN: 13 mg/dL (ref 6–20)
CO2: 16 mmol/L — ABNORMAL LOW (ref 22–32)
Calcium: 8.5 mg/dL — ABNORMAL LOW (ref 8.9–10.3)
Chloride: 100 mmol/L (ref 98–111)
Creatinine, Ser: 0.72 mg/dL (ref 0.61–1.24)
GFR calc Af Amer: >60 mL/min (ref >60)
GFR calc non Af Amer: >60 mL/min (ref >60)
Glucose, Bld: 136 mg/dL — ABNORMAL HIGH (ref 70–99)
Potassium: 3.6 mmol/L (ref 3.5–5.1)
Sodium: 138 mmol/L (ref 135–145)
Total Bilirubin: 2.6 mg/dL — ABNORMAL HIGH (ref 0.3–1.2)
Total Protein: 6.7 g/dL (ref 6.5–8.1)

## 2019-05-19 LAB — CBC
HCT: 31.6 % — ABNORMAL LOW (ref 39.0–52.0)
Hemoglobin: 10.1 g/dL — ABNORMAL LOW (ref 13.0–17.0)
MCH: 34.4 pg — ABNORMAL HIGH (ref 26.0–34.0)
MCHC: 32 g/dL (ref 30.0–36.0)
MCV: 107.5 fL — ABNORMAL HIGH (ref 80.0–100.0)
Platelets: 66 10*3/uL — ABNORMAL LOW (ref 150–400)
RBC: 2.94 MIL/uL — ABNORMAL LOW (ref 4.22–5.81)
RDW: 13.4 % (ref 11.5–15.5)
WBC: 8.6 10*3/uL (ref 4.0–10.5)
nRBC: 0 % (ref 0.0–0.2)

## 2019-05-19 LAB — PROTIME-INR
INR: 1.6 — ABNORMAL HIGH (ref 0.8–1.2)
Prothrombin Time: 18.8 seconds — ABNORMAL HIGH (ref 11.4–15.2)

## 2019-05-19 LAB — ETHANOL: Alcohol, Ethyl (B): 12 mg/dL — ABNORMAL HIGH (ref <10)

## 2019-05-19 LAB — SARS CORONAVIRUS 2 BY RT PCR (HOSPITAL ORDER, PERFORMED IN ~~LOC~~ HOSPITAL LAB): SARS Coronavirus 2: NEGATIVE

## 2019-05-19 LAB — POC OCCULT BLOOD, ED: Fecal Occult Bld: POSITIVE — AB

## 2019-05-19 LAB — ABO/RH: ABO/RH(D): O POS

## 2019-05-19 MED ORDER — FENTANYL CITRATE (PF) 100 MCG/2ML IJ SOLN: 25.0000 ug | INTRAMUSCULAR | Status: DC | PRN

## 2019-05-19 MED ORDER — SODIUM CHLORIDE 0.9 % IV SOLN
2.0000 g | Freq: Once | INTRAVENOUS | Status: AC
  Administered 2019-05-19: 2 g via INTRAVENOUS
  Filled 2019-05-19: qty 20

## 2019-05-19 MED ORDER — SODIUM CHLORIDE 0.9 % IV SOLN
50.0000 ug/h | INTRAVENOUS | Status: DC
  Administered 2019-05-19 – 2019-05-22 (×5): 50 ug/h via INTRAVENOUS
  Filled 2019-05-19 (×10): qty 1

## 2019-05-19 MED ORDER — SODIUM CHLORIDE 0.9 % IV SOLN
80.0000 mg | Freq: Once | INTRAVENOUS | Status: AC
  Administered 2019-05-19: 80 mg via INTRAVENOUS
  Filled 2019-05-19: qty 80

## 2019-05-19 MED ORDER — SODIUM CHLORIDE 0.9 % IV SOLN
INTRAVENOUS | Status: AC
  Administered 2019-05-19: 21:00:00 via INTRAVENOUS

## 2019-05-19 MED ORDER — ONDANSETRON HCL 4 MG/2ML IJ SOLN
4.0000 mg | Freq: Four times a day (QID) | INTRAMUSCULAR | Status: DC | PRN
  Administered 2019-05-20: 4 mg via INTRAVENOUS
  Filled 2019-05-19: qty 2

## 2019-05-19 MED ORDER — MORPHINE SULFATE (PF) 4 MG/ML IV SOLN
4.0000 mg | Freq: Once | INTRAVENOUS | Status: AC
  Administered 2019-05-19: 4 mg via INTRAVENOUS
  Filled 2019-05-19: qty 1

## 2019-05-19 MED ORDER — SODIUM CHLORIDE 0.9 % IV BOLUS
1000.0000 mL | Freq: Once | INTRAVENOUS | Status: AC
  Administered 2019-05-19: 1000 mL via INTRAVENOUS

## 2019-05-19 MED ORDER — SODIUM CHLORIDE 0.9 % IV SOLN
INTRAVENOUS | Status: DC
  Administered 2019-05-19: 20:00:00 via INTRAVENOUS

## 2019-05-19 MED ORDER — OCTREOTIDE LOAD VIA INFUSION
50.0000 ug | Freq: Once | INTRAVENOUS | Status: AC
  Administered 2019-05-19: 50 ug via INTRAVENOUS
  Filled 2019-05-19: qty 25

## 2019-05-19 MED ORDER — SODIUM CHLORIDE 0.9 % IV SOLN
1.0000 g | INTRAVENOUS | Status: DC
  Administered 2019-05-20 – 2019-05-21 (×2): 1 g via INTRAVENOUS
  Filled 2019-05-19 (×2): qty 10

## 2019-05-19 MED ORDER — ONDANSETRON HCL 4 MG/2ML IJ SOLN
4.0000 mg | Freq: Once | INTRAMUSCULAR | Status: AC
  Administered 2019-05-19: 4 mg via INTRAVENOUS
  Filled 2019-05-19: qty 2

## 2019-05-19 MED ORDER — ONDANSETRON HCL 4 MG PO TABS: 4.0000 mg | ORAL_TABLET | Freq: Four times a day (QID) | ORAL | Status: DC | PRN

## 2019-05-19 MED ORDER — SODIUM CHLORIDE 0.9 % IV SOLN
8.0000 mg/h | INTRAVENOUS | Status: DC
  Administered 2019-05-19: 8 mg/h via INTRAVENOUS
  Filled 2019-05-19 (×2): qty 80

## 2019-05-19 NOTE — H&P (Signed)
History and Physical    Stephen Miranda [id number] DOB: Sep 27, 1970 DOA: 05/19/2019  PCP: Mack Hook, MD   Patient coming from: Home   Chief Complaint: Abdominal pain, vomiting blood, dark stool   HPI: Stephen Miranda is a 48 y.o. male with medical history significant for elevated liver enzymes, thrombocytopenia, and malignant melanoma of his back status post excision, now presenting to the emergency department with abdominal pain, hematemesis, and melena.  Patient reports waking at approximately 3 AM this morning with pain in the mid abdomen, nausea, and vomited red blood.  He went on to have 3 more episodes with the vomitus becoming darker and more bradycardia.  He also had a very dark bowel movement today.  He reports drinking 2 beers per day, but acknowledges previously drinking much more than that.  He is not aware of any underlying liver disease.  He denies history of blood transfusion or IV drug use.  Denies family history of liver disease or bleeding disorder.  He denies any fevers, cough, or shortness of breath.  ED Course: Upon arrival to the ED, patient is found to be afebrile, saturating well on room air, tachycardic to 130, and with stable blood pressure.  EKG features sinus tachycardia with rate 132 and PVCs.  Chest x-ray is negative for acute cardiopulmonary disease.  Chemistry panel is notable for AST 97 and total bilirubin 2.6.  Fecal occult blood testing is positive.  Ethanol level is 12.  CBC notable for microcytic anemia with hemoglobin 10.1 and a thrombocytopenia with platelets 66,000.  INR is elevated to 1.6.  Type and screen was performed in the emergency department, 1 L normal saline was administered, and the patient was treated with Rocephin, morphine, and started on octreotide and Protonix infusions.  COVID-19 testing is in process.  Gastroenterology was consulted by the ED physician.  Review of Systems:  All other systems reviewed and apart from HPI, are negative.  Past  Medical History:  Diagnosis Date  . Chronic insomnia 05/23/2016   Has stated in past since age 41 yo, but worse in recent years with restless legs.  . Elevated liver enzymes 04/24/2015   04/18/2015:  AST:  115     ALT:  90. Likely due to alcohol intake  . 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 Sarajane Jews, [facility]  . Restless legs 05/23/2016  . Seborrheic dermatitis 05/23/2016    Past Surgical History:  Procedure Laterality Date  . Excision Malignant Melanoma Left 02/07/2015   Left upper back     reports that he has been smoking cigarettes. He has a 10.00 pack-year smoking history. He has never used smokeless tobacco. He reports current alcohol use of about 4.0 standard drinks of alcohol per week. He reports that he does not use drugs.  No Known Allergies  Family History  Problem Relation Age of Onset  . Fibromyalgia Mother      Prior to Admission medications   Medication Sig Start Date End Date Taking? Authorizing Provider  gabapentin (NEURONTIN) 100 MG capsule Titrate to 6 caps by mouth at bedtime nightly Patient not taking: Reported on 05/19/2019 09/06/16   Mack Hook, MD    Physical Exam: Vitals:   05/19/19 1633 05/19/19 1842 05/19/19 1846  BP: 137/78  (!) 141/116  Pulse:   (!) 127  Resp: 18    Temp: 98.3 F (36.8 C)    TempSrc: Oral    SpO2: 98%  99%  Weight:  83.9  kg   Height:  6\' 4"  (1.93 m)     Constitutional: NAD, calm  Eyes: PERTLA, lids and conjunctivae normal ENMT: Mucous membranes are moist. Posterior pharynx clear of any exudate or lesions.   Neck: normal, supple, no masses, no thyromegaly Respiratory: no wheezing, no crackles. Normal respiratory effort. No accessory muscle use.  Cardiovascular: Rate ~120 and regular. Pretibial pitting edema bilaterally.   Abdomen: Soft, tender in mid-abdomen, no rebound pain or guarding. . Bowel sounds active.  Musculoskeletal: no clubbing / cyanosis. No joint  deformity upper and lower extremities.  Skin: no significant rashes, lesions, ulcers. Warm, dry, well-perfused. Neurologic: No facial asymmetry. Sensation intact. Moving all extremities.  Psychiatric: Alert and oriented to person, place, and situation. Calm, cooperataive.    Labs on Admission: I have personally reviewed following labs and imaging studies  CBC: Recent Labs  Lab 05/19/19 1653  WBC 8.6  HGB 10.1*  HCT 31.6*  MCV 107.5*  PLT 66*   Basic Metabolic Panel: Recent Labs  Lab 05/19/19 1653  NA 138  K 3.6  CL 100  CO2 16*  GLUCOSE 136*  BUN 13  CREATININE 0.72  CALCIUM 8.5*   GFR: Estimated Creatinine Clearance: 135.5 mL/min (by C-G formula based on SCr of 0.72 mg/dL). Liver Function Tests: Recent Labs  Lab 05/19/19 1653  AST 97*  ALT 29  ALKPHOS 63  BILITOT 2.6*  PROT 6.7  ALBUMIN 3.1*   No results for input(s): LIPASE, AMYLASE in the last 168 hours. No results for input(s): AMMONIA in the last 168 hours. Coagulation Profile: Recent Labs  Lab 05/19/19 1844  INR 1.6*   Cardiac Enzymes: No results for input(s): CKTOTAL, CKMB, CKMBINDEX, TROPONINI in the last 168 hours. BNP (last 3 results) No results for input(s): PROBNP in the last 8760 hours. HbA1C: No results for input(s): HGBA1C in the last 72 hours. CBG: No results for input(s): GLUCAP in the last 168 hours. Lipid Profile: No results for input(s): CHOL, HDL, LDLCALC, TRIG, CHOLHDL, LDLDIRECT in the last 72 hours. Thyroid Function Tests: No results for input(s): TSH, T4TOTAL, FREET4, T3FREE, THYROIDAB in the last 72 hours. Anemia Panel: No results for input(s): VITAMINB12, FOLATE, FERRITIN, TIBC, IRON, RETICCTPCT in the last 72 hours. Urine analysis: No results found for: COLORURINE, APPEARANCEUR, LABSPEC, PHURINE, GLUCOSEU, HGBUR, BILIRUBINUR, KETONESUR, PROTEINUR, UROBILINOGEN, NITRITE, LEUKOCYTESUR Sepsis Labs: @LABRCNTIP (procalcitonin:4,lacticidven:4) )No results found for this or any  previous visit (from the past 240 hour(s)).   Radiological Exams on Admission: Dg Chest Port 1 View  Result Date: 05/19/2019 CLINICAL DATA:  Chest pain nausea vomiting EXAM: PORTABLE CHEST 1 VIEW COMPARISON:  None. FINDINGS: The heart size and mediastinal contours are within normal limits. Both lungs are clear. The visualized skeletal structures are unremarkable. IMPRESSION: No active disease. Electronically Signed   By: Donavan Foil M.D.   On: 05/19/2019 19:27    EKG: Independently reviewed. Sinus tachycardia (rate 132), PVC's.   Assessment/Plan   1. Acute upper GI bleeding; coagulopathy; macrocytic anemia; thrombocytopenia  - Presents with abdominal pain, hematemesis, and melena, and is found to be tachycardic to 130 with stable BP and initial Hgb 10.1  - INR is 1.6 and platelets 66k on admission  - GI is consulting and much appreciated  - Continue bowel rest, Protonix and octreotide infusions, prophylactic Rocephin, IVF hydration, trend H&H, transfuse as needed   2. Alcohol abuse   - Patient reports history of alcohol abuse, said he only drinks 2 beers/day now  - Monitor with CIWA, use  Ativan as needed, supplement vitamins, monitor electrolytes    3. Elevated AST, bilirubin  - AST is 97 and total bilirubin 2.6 on admission  - Constellation of lab findings, as well as exam including angiomata is concerning for liver disease, likely related to hx of alcohol abuse, counseled with strict alcohol avoidance recommended, check viral hepatitis panel    PPE: mask, face shield  DVT prophylaxis: SCD's  Code Status: Full  Family Communication: Discussed with patient  Consults called: GI consulted by ED physician  Admission status: Observation     Vianne Bulls, MD [facility] Pager [phone]  If 7PM-7AM, please contact night-coverage [email or web address] Password [facility]  05/19/2019, 8:19 PM

## 2019-05-19 NOTE — ED Provider Notes (Signed)
[place] EMERGENCY DEPARTMENT Provider Note   CSN: [id number] Arrival date & time: 05/19/19  1624     History   Chief Complaint No chief complaint on file.   HPI Stephen Miranda is a 48 y.o. male.     Pt presents to the ED today with vomiting blood.  Pt said sx started around 0330 today.  He has also seen some black stool.  Pt said initial emesis was bloody, but now it is "coffee ground" in color.  Pt feels nauseous and has some upper abdominal pain.  This has never happened to him in the past.  He has never seen a GI doctor in the past.  He does drink 2 beers per day.  No f/c.  No cough.       Past Medical History:  Diagnosis Date  . Chronic insomnia 05/23/2016   Has stated in past since age 75 yo, but worse in recent years with restless legs.  . Elevated liver enzymes 04/24/2015   04/18/2015:  AST:  115     ALT:  90. Likely due to alcohol intake  . 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 Sarajane Jews, [facility]  . Restless legs 05/23/2016  . Seborrheic dermatitis 05/23/2016    Patient Active Problem List   Diagnosis Date Noted  . Acute upper GI bleeding 05/19/2019  . Tobacco use 05/23/2016  . Seborrheic dermatitis 05/23/2016  . Restless legs 05/23/2016  . Chronic insomnia 05/23/2016  . 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 Elevated liver enzymes 04/24/2015    Past Surgical History:  Procedure Laterality Date  . Excision Malignant Melanoma Left 02/07/2015   Left upper back        Home Medications    Prior to Admission medications   Medication Sig Start Date End Date Taking? Authorizing Provider  gabapentin (NEURONTIN) 100 MG capsule Titrate to 6 caps by mouth at bedtime nightly Patient not taking: Reported on 05/19/2019 09/06/16   Mack Hook, MD    Family History Family History  Problem Relation Age of Onset  . Fibromyalgia Mother     Social History Social History    Tobacco Use  . Smoking status: Current Every Day Smoker    Packs/day: 0.50    Years: 20.00    Pack years: 10.00    Types: Cigarettes  . Smokeless tobacco: Never Used  . Tobacco comment: prescribed Chantix in past, but did not take  Substance Use Topics  . Alcohol use: Yes    Alcohol/week: 4.0 standard drinks    Types: 2 Cans of beer, 2 Shots of liquor per week    Comment: Nightly  . Drug use: No     Allergies   Patient has no known allergies.   Review of Systems Review of Systems  Gastrointestinal: Positive for abdominal pain, nausea and vomiting.  All other systems reviewed and are negative.    Physical Exam Updated Vital Signs BP (!) 141/116   Pulse (!) 127   Temp 98.3 F (36.8 C) (Oral)   Resp 18   Ht 6\' 4"  (1.93 m)   Wt 83.9 kg   SpO2 99%   BMI 22.52 kg/m   Physical Exam Vitals signs and nursing note reviewed.  Constitutional:      General: He is in acute distress.  HENT:     Head: Normocephalic and atraumatic.     Right Ear: External ear normal.     Left  Ear: External ear normal.     Nose: Nose normal.     Mouth/Throat:     Mouth: Mucous membranes are dry.  Eyes:     Extraocular Movements: Extraocular movements intact.     Conjunctiva/sclera: Conjunctivae normal.     Pupils: Pupils are equal, round, and reactive to light.  Neck:     Musculoskeletal: Normal range of motion and neck supple.  Cardiovascular:     Rate and Rhythm: Regular rhythm. Tachycardia present.     Pulses: Normal pulses.     Heart sounds: Normal heart sounds.  Pulmonary:     Effort: Pulmonary effort is normal.     Breath sounds: Normal breath sounds.  Abdominal:     General: Abdomen is flat. Bowel sounds are normal.     Palpations: Abdomen is soft.     Tenderness: There is abdominal tenderness in the epigastric area.  Genitourinary:    Rectum: Guaiac result positive.     Comments: Black stool Musculoskeletal:     Right lower leg: Edema present.     Left lower leg: Edema  present.  Skin:    General: Skin is warm.     Capillary Refill: Capillary refill takes less than 2 seconds.  Neurological:     General: No focal deficit present.     Mental Status: He is alert and oriented to person, place, and time.  Psychiatric:        Mood and Affect: Mood normal.        Behavior: Behavior normal.      ED Treatments / Results  Labs (all labs ordered are listed, but only abnormal results are displayed) Labs Reviewed  COMPREHENSIVE METABOLIC PANEL - Abnormal; Notable for the following components:      Result Value   CO2 16 (*)    Glucose, Bld 136 (*)    Calcium 8.5 (*)    Albumin 3.1 (*)    AST 97 (*)    Total Bilirubin 2.6 (*)    Anion gap 22 (*)    All other components within normal limits  CBC - Abnormal; Notable for the following components:   RBC 2.94 (*)    Hemoglobin 10.1 (*)    HCT 31.6 (*)    MCV 107.5 (*)    MCH 34.4 (*)    Platelets 66 (*)    All other components within normal limits  PROTIME-INR - Abnormal; Notable for the following components:   Prothrombin Time 18.8 (*)    INR 1.6 (*)    All other components within normal limits  ETHANOL - Abnormal; Notable for the following components:   Alcohol, Ethyl (B) 12 (*)    All other components within normal limits  POC OCCULT BLOOD, ED - Abnormal; Notable for the following components:   Fecal Occult Bld POSITIVE (*)    All other components within normal limits  SARS CORONAVIRUS 2 BY RT PCR (HOSPITAL ORDER, [place] [facility])  TYPE AND SCREEN  ABO/RH    EKG EKG Interpretation  Date/Time:  Wednesday May 19 2019 16:47:33 EDT Ventricular Rate:  132 PR Interval:  136 QRS Duration: 100 QT Interval:  322 QTC Calculation: 477 R Axis:   43 Text Interpretation: Sinus tachycardia with occasional Premature ventricular complexes T wave abnormality, consider inferior ischemia Abnormal ECG Since last tracing rate faster Confirmed by Isla Pence [phone]) on 05/19/2019  6:56:37 PM   Radiology Dg Chest Port 1 View  Result Date: 05/19/2019 CLINICAL DATA:  Chest pain nausea vomiting EXAM: PORTABLE CHEST 1 VIEW COMPARISON:  None. FINDINGS: The heart size and mediastinal contours are within normal limits. Both lungs are clear. The visualized skeletal structures are unremarkable. IMPRESSION: No active disease. Electronically Signed   By: Donavan Foil M.D.   On: 05/19/2019 19:27    Procedures Procedures (including critical care time)  Medications Ordered in ED Medications  sodium chloride 0.9 % bolus 1,000 mL (0 mLs Intravenous Stopped 05/19/19 1944)    And  0.9 %  sodium chloride infusion ( Intravenous New Bag/Given 05/19/19 1945)  pantoprazole (PROTONIX) 80 mg in sodium chloride 0.9 % 250 mL (0.32 mg/mL) infusion (has no administration in time range)  octreotide (SANDOSTATIN) 2 mcg/mL load via infusion 50 mcg (has no administration in time range)    And  octreotide (SANDOSTATIN) 500 mcg in sodium chloride 0.9 % 250 mL (2 mcg/mL) infusion (has no administration in time range)  cefTRIAXone (ROCEPHIN) 2 g in sodium chloride 0.9 % 100 mL IVPB (has no administration in time range)  pantoprazole (PROTONIX) 80 mg in sodium chloride 0.9 % 100 mL IVPB (80 mg Intravenous New Bag/Given 05/19/19 1947)  ondansetron (ZOFRAN) injection 4 mg (4 mg Intravenous Given 05/19/19 1857)  morphine 4 MG/ML injection 4 mg (4 mg Intravenous Given 05/19/19 1858)     Initial Impression / Assessment and Plan / ED Course  I have reviewed the triage vital signs and the nursing notes.  Pertinent labs & imaging results that were available during my care of the patient were reviewed by me and considered in my medical decision making (see chart for details).      Pt given IVFs and IV protonix bolus and drip was ordered.  Type and Screen and 2 IVs were started.  Pt d/w Dr. Loletha Carrow ([facility]).  He agreed with the protonix.  He also requested that we give octreotide and rocephin.  This is  done.  Undiagnosed cirrhosis is suspected.  Pt d/w Dr. Myna Hidalgo ([facility]) for admission.  Rapid covid test sent.  CRITICAL CARE Performed by: Isla Pence   Total critical care time: 30 minutes  Critical care time was exclusive of separately billable procedures and treating other patients.  Critical care was necessary to treat or prevent imminent or life-threatening deterioration.  Critical care was time spent personally by me on the following activities: development of treatment plan with patient and/or surrogate as well as nursing, discussions with consultants, evaluation of patient's response to treatment, examination of patient, obtaining history from patient or surrogate, ordering and performing treatments and interventions, ordering and review of laboratory studies, ordering and review of radiographic studies, pulse oximetry and re-evaluation of patient's condition.  Final Clinical Impressions(s) / ED Diagnoses   Final diagnoses:  GI bleed  Thrombocytopenia ([place])  Acute post-hemorrhagic anemia    ED Discharge Orders    None       Isla Pence, MD 05/19/19 2012

## 2019-05-19 NOTE — ED Triage Notes (Signed)
Onset this morning vomited x 4, bright red in color, abd pain, and several black stools.

## 2019-05-20 ENCOUNTER — Inpatient Hospital Stay (HOSPITAL_COMMUNITY): Payer: Self-pay

## 2019-05-20 ENCOUNTER — Encounter (HOSPITAL_COMMUNITY)
Admission: EM | Disposition: A | Discharge status: Home / Self Care | Payer: Self-pay | Source: Home / Self Care | Attending: Internal Medicine

## 2019-05-20 ENCOUNTER — Observation Stay (HOSPITAL_COMMUNITY): Payer: Self-pay | Admitting: Anesthesiology

## 2019-05-20 ENCOUNTER — Encounter (HOSPITAL_COMMUNITY): Payer: Self-pay | Admitting: Emergency Medicine

## 2019-05-20 DIAGNOSIS — D689 Coagulation defect, unspecified: Secondary | ICD-10-CM

## 2019-05-20 DIAGNOSIS — F1010 Alcohol abuse, uncomplicated: Secondary | ICD-10-CM

## 2019-05-20 DIAGNOSIS — K228 Other specified diseases of esophagus: Secondary | ICD-10-CM

## 2019-05-20 DIAGNOSIS — D696 Thrombocytopenia, unspecified: Secondary | ICD-10-CM

## 2019-05-20 DIAGNOSIS — R7989 Other specified abnormal findings of blood chemistry: Secondary | ICD-10-CM

## 2019-05-20 DIAGNOSIS — K766 Portal hypertension: Secondary | ICD-10-CM

## 2019-05-20 DIAGNOSIS — I8510 Secondary esophageal varices without bleeding: Secondary | ICD-10-CM

## 2019-05-20 DIAGNOSIS — D62 Acute posthemorrhagic anemia: Secondary | ICD-10-CM

## 2019-05-20 DIAGNOSIS — K922 Gastrointestinal hemorrhage, unspecified: Secondary | ICD-10-CM | POA: Diagnosis present

## 2019-05-20 DIAGNOSIS — R945 Abnormal results of liver function studies: Secondary | ICD-10-CM

## 2019-05-20 HISTORY — PX: ESOPHAGOGASTRODUODENOSCOPY (EGD) WITH PROPOFOL: SHX5813

## 2019-05-20 LAB — CBC
HCT: 25.7 % — ABNORMAL LOW (ref 39.0–52.0)
Hemoglobin: 8.5 g/dL — ABNORMAL LOW (ref 13.0–17.0)
MCH: 34.3 pg — ABNORMAL HIGH (ref 26.0–34.0)
MCHC: 33.1 g/dL (ref 30.0–36.0)
MCV: 103.6 fL — ABNORMAL HIGH (ref 80.0–100.0)
Platelets: 34 10*3/uL — ABNORMAL LOW (ref 150–400)
RBC: 2.48 MIL/uL — ABNORMAL LOW (ref 4.22–5.81)
RDW: 14.8 % (ref 11.5–15.5)
WBC: 3.8 10*3/uL — ABNORMAL LOW (ref 4.0–10.5)
nRBC: 0 % (ref 0.0–0.2)

## 2019-05-20 LAB — COMPREHENSIVE METABOLIC PANEL
ALT: 25 U/L (ref 0–44)
ALT: 25 U/L (ref 0–44)
AST: 83 U/L — ABNORMAL HIGH (ref 15–41)
AST: 81 U/L — ABNORMAL HIGH (ref 15–41)
Albumin: 2.5 g/dL — ABNORMAL LOW (ref 3.5–5.0)
Albumin: 2.5 g/dL — ABNORMAL LOW (ref 3.5–5.0)
Alkaline Phosphatase: 41 U/L (ref 38–126)
Alkaline Phosphatase: 39 U/L (ref 38–126)
Anion gap: 8 (ref 5–15)
Anion gap: 6 (ref 5–15)
BUN: 13 mg/dL (ref 6–20)
BUN: 17 mg/dL (ref 6–20)
CO2: 27 mmol/L (ref 22–32)
CO2: 25 mmol/L (ref 22–32)
Calcium: 7.8 mg/dL — ABNORMAL LOW (ref 8.9–10.3)
Calcium: 7.7 mg/dL — ABNORMAL LOW (ref 8.9–10.3)
Chloride: 104 mmol/L (ref 98–111)
Chloride: 108 mmol/L (ref 98–111)
Creatinine, Ser: 0.68 mg/dL (ref 0.61–1.24)
Creatinine, Ser: 0.69 mg/dL (ref 0.61–1.24)
GFR calc Af Amer: >60 mL/min (ref >60)
GFR calc Af Amer: >60 mL/min (ref >60)
GFR calc non Af Amer: >60 mL/min (ref >60)
GFR calc non Af Amer: >60 mL/min (ref >60)
Glucose, Bld: 127 mg/dL — ABNORMAL HIGH (ref 70–99)
Glucose, Bld: 134 mg/dL — ABNORMAL HIGH (ref 70–99)
Potassium: 3.7 mmol/L (ref 3.5–5.1)
Potassium: 3.6 mmol/L (ref 3.5–5.1)
Sodium: 139 mmol/L (ref 135–145)
Sodium: 139 mmol/L (ref 135–145)
Total Bilirubin: 2.5 mg/dL — ABNORMAL HIGH (ref 0.3–1.2)
Total Bilirubin: 3 mg/dL — ABNORMAL HIGH (ref 0.3–1.2)
Total Protein: 5.2 g/dL — ABNORMAL LOW (ref 6.5–8.1)
Total Protein: 4.9 g/dL — ABNORMAL LOW (ref 6.5–8.1)

## 2019-05-20 LAB — MAGNESIUM: Magnesium: 1.5 mg/dL — ABNORMAL LOW (ref 1.7–2.4)

## 2019-05-20 LAB — HEPATITIS PANEL, ACUTE
HCV Ab: NONREACTIVE
Hep A IgM: NONREACTIVE
Hep B C IgM: NONREACTIVE
Hepatitis B Surface Ag: NONREACTIVE

## 2019-05-20 LAB — VITAMIN B12: Vitamin B-12: 346 pg/mL (ref 180–914)

## 2019-05-20 LAB — IRON AND TIBC
Iron: 180 ug/dL (ref 45–182)
Saturation Ratios: 86 % — ABNORMAL HIGH (ref 17.9–39.5)
TIBC: 210 ug/dL — ABNORMAL LOW (ref 250–450)
UIBC: 30 ug/dL

## 2019-05-20 LAB — HEMOGLOBIN
Hemoglobin: 8.4 g/dL — ABNORMAL LOW (ref 13.0–17.0)
Hemoglobin: 7.6 g/dL — ABNORMAL LOW (ref 13.0–17.0)

## 2019-05-20 LAB — AMYLASE: Amylase: 32 U/L (ref 28–100)

## 2019-05-20 LAB — HIV ANTIBODY (ROUTINE TESTING W REFLEX): HIV Screen 4th Generation wRfx: NONREACTIVE

## 2019-05-20 LAB — PREPARE RBC (CROSSMATCH)

## 2019-05-20 LAB — RETICULOCYTES
Immature Retic Fract: 13.2 % (ref 2.3–15.9)
RBC.: 2.48 MIL/uL — ABNORMAL LOW (ref 4.22–5.81)
Retic Count, Absolute: 67.5 10*3/uL (ref 19.0–186.0)
Retic Ct Pct: 2.7 % (ref 0.4–3.1)

## 2019-05-20 LAB — LIPASE, BLOOD: Lipase: 22 U/L (ref 11–51)

## 2019-05-20 LAB — FERRITIN: Ferritin: 187 ng/mL (ref 24–336)

## 2019-05-20 LAB — HEPATITIS B CORE ANTIBODY, TOTAL: Hep B Core Total Ab: NONREACTIVE

## 2019-05-20 LAB — PHOSPHORUS: Phosphorus: 2.6 mg/dL (ref 2.5–4.6)

## 2019-05-20 LAB — HEPATITIS A ANTIBODY, TOTAL: hep A Total Ab: NONREACTIVE

## 2019-05-20 LAB — PROTIME-INR
INR: 1.7 — ABNORMAL HIGH (ref 0.8–1.2)
Prothrombin Time: 19.9 seconds — ABNORMAL HIGH (ref 11.4–15.2)

## 2019-05-20 LAB — HEMATOCRIT
HCT: 25.5 % — ABNORMAL LOW (ref 39.0–52.0)
HCT: 24.1 % — ABNORMAL LOW (ref 39.0–52.0)

## 2019-05-20 LAB — CBG MONITORING, ED: Glucose-Capillary: 118 mg/dL — ABNORMAL HIGH (ref 70–99)

## 2019-05-20 LAB — HEPATITIS B SURFACE ANTIGEN: Hepatitis B Surface Ag: NONREACTIVE

## 2019-05-20 LAB — FOLATE: Folate: 5.1 ng/mL — ABNORMAL LOW (ref >5.9)

## 2019-05-20 SURGERY — ESOPHAGOGASTRODUODENOSCOPY (EGD) WITH PROPOFOL
Anesthesia: General

## 2019-05-20 MED ORDER — PHENYLEPHRINE HCL-NACL 10-0.9 MG/250ML-% IV SOLN: INTRAVENOUS | Status: DC | PRN

## 2019-05-20 MED ORDER — FOLIC ACID 1 MG PO TABS
1.0000 mg | ORAL_TABLET | Freq: Every day | ORAL | Status: DC
  Administered 2019-05-21 – 2019-05-22 (×2): 1 mg via ORAL
  Filled 2019-05-20 (×2): qty 1

## 2019-05-20 MED ORDER — LORAZEPAM 2 MG/ML IJ SOLN: 0.0000 mg | INTRAMUSCULAR | Status: AC

## 2019-05-20 MED ORDER — LACTATED RINGERS IV SOLN
INTRAVENOUS | Status: DC | PRN
  Administered 2019-05-20: 14:00:00 via INTRAVENOUS

## 2019-05-20 MED ORDER — SODIUM CHLORIDE 0.9% FLUSH
3.0000 mL | Freq: Two times a day (BID) | INTRAVENOUS | Status: DC
  Administered 2019-05-21 – 2019-05-22 (×2): 3 mL via INTRAVENOUS

## 2019-05-20 MED ORDER — METOCLOPRAMIDE HCL 5 MG/ML IJ SOLN: 10.0000 mg | Freq: Once | INTRAMUSCULAR | Status: DC

## 2019-05-20 MED ORDER — SODIUM CHLORIDE 0.9% IV SOLUTION: Freq: Once | INTRAVENOUS | Status: DC

## 2019-05-20 MED ORDER — PHENYLEPHRINE 40 MCG/ML (10ML) SYRINGE FOR IV PUSH (FOR BLOOD PRESSURE SUPPORT)
PREFILLED_SYRINGE | INTRAVENOUS | Status: DC | PRN
  Administered 2019-05-20: 120 ug via INTRAVENOUS
  Administered 2019-05-20 (×2): 80 ug via INTRAVENOUS
  Administered 2019-05-20: 120 ug via INTRAVENOUS

## 2019-05-20 MED ORDER — ADULT MULTIVITAMIN W/MINERALS CH
1.0000 | ORAL_TABLET | Freq: Every day | ORAL | Status: DC
  Administered 2019-05-21 – 2019-05-22 (×2): 1 via ORAL
  Filled 2019-05-20 (×2): qty 1

## 2019-05-20 MED ORDER — THIAMINE HCL 100 MG/ML IJ SOLN
100.0000 mg | Freq: Every day | INTRAMUSCULAR | Status: DC
  Administered 2019-05-20: 100 mg via INTRAVENOUS
  Filled 2019-05-20: qty 2

## 2019-05-20 MED ORDER — IOHEXOL 300 MG/ML  SOLN
100.0000 mL | Freq: Once | INTRAMUSCULAR | Status: AC | PRN
  Administered 2019-05-20: 100 mL via INTRAVENOUS

## 2019-05-20 MED ORDER — ONDANSETRON HCL 4 MG/2ML IJ SOLN
INTRAMUSCULAR | Status: DC | PRN
  Administered 2019-05-20: 4 mg via INTRAVENOUS

## 2019-05-20 MED ORDER — PROPOFOL 10 MG/ML IV BOLUS
INTRAVENOUS | Status: DC | PRN
  Administered 2019-05-20: 190 mg via INTRAVENOUS

## 2019-05-20 MED ORDER — NICOTINE 14 MG/24HR TD PT24
14.0000 mg | MEDICATED_PATCH | Freq: Every day | TRANSDERMAL | Status: DC
  Administered 2019-05-20: 14 mg via TRANSDERMAL
  Filled 2019-05-20 (×3): qty 1

## 2019-05-20 MED ORDER — VITAMIN B-1 100 MG PO TABS
100.0000 mg | ORAL_TABLET | Freq: Every day | ORAL | Status: DC
  Administered 2019-05-21 – 2019-05-22 (×2): 100 mg via ORAL
  Filled 2019-05-20 (×2): qty 1

## 2019-05-20 MED ORDER — SUCCINYLCHOLINE CHLORIDE 20 MG/ML IJ SOLN
INTRAMUSCULAR | Status: DC | PRN
  Administered 2019-05-20: 140 mg via INTRAVENOUS

## 2019-05-20 MED ORDER — LORAZEPAM 1 MG PO TABS: 1.0000 mg | ORAL_TABLET | ORAL | Status: DC | PRN

## 2019-05-20 MED ORDER — CHLORHEXIDINE GLUCONATE 0.12 % MT SOLN
15.0000 mL | Freq: Two times a day (BID) | OROMUCOSAL | Status: DC
  Administered 2019-05-21 – 2019-05-22 (×2): 15 mL via OROMUCOSAL
  Filled 2019-05-20 (×3): qty 15

## 2019-05-20 MED ORDER — PNEUMOCOCCAL VAC POLYVALENT 25 MCG/0.5ML IJ INJ
0.5000 mL | INJECTION | INTRAMUSCULAR | Status: DC
  Filled 2019-05-20: qty 0.5

## 2019-05-20 MED ORDER — LIDOCAINE 2% (20 MG/ML) 5 ML SYRINGE
INTRAMUSCULAR | Status: DC | PRN
  Administered 2019-05-20: 100 mg via INTRAVENOUS

## 2019-05-20 MED ORDER — PANTOPRAZOLE SODIUM 40 MG IV SOLR
40.0000 mg | Freq: Two times a day (BID) | INTRAVENOUS | Status: DC
  Administered 2019-05-20 – 2019-05-22 (×4): 40 mg via INTRAVENOUS
  Filled 2019-05-20 (×4): qty 40

## 2019-05-20 MED ORDER — VITAMIN K1 10 MG/ML IJ SOLN
10.0000 mg | Freq: Once | INTRAVENOUS | Status: AC
  Administered 2019-05-20: 09:00:00 10 mg via INTRAVENOUS
  Filled 2019-05-20: qty 1

## 2019-05-20 MED ORDER — DEXAMETHASONE SODIUM PHOSPHATE 10 MG/ML IJ SOLN
INTRAMUSCULAR | Status: DC | PRN
  Administered 2019-05-20: 4 mg via INTRAVENOUS

## 2019-05-20 MED ORDER — LORAZEPAM 2 MG/ML IJ SOLN: 1.0000 mg | INTRAMUSCULAR | Status: DC | PRN

## 2019-05-20 MED ORDER — LORAZEPAM 2 MG/ML IJ SOLN: 0.0000 mg | Freq: Three times a day (TID) | INTRAMUSCULAR | Status: DC

## 2019-05-20 MED ORDER — ORAL CARE MOUTH RINSE
15.0000 mL | Freq: Two times a day (BID) | OROMUCOSAL | Status: DC
  Administered 2019-05-21 – 2019-05-22 (×2): 15 mL via OROMUCOSAL

## 2019-05-20 SURGICAL SUPPLY — 15 items

## 2019-05-20 NOTE — ED Notes (Signed)
Pt cbg was 118. Notified Brewing technologist

## 2019-05-20 NOTE — ED Notes (Signed)
Please call Toronto Trabert at [phone] to give her an update on pt

## 2019-05-20 NOTE — Progress Notes (Signed)
Received report from ED.  

## 2019-05-20 NOTE — Progress Notes (Signed)
Pt arrived from ED. Pt is A&Ox4. Pt lives at home alone. Pt's bilateral arms has bruising. Pt told to call for assistance before getting out of bed. Pt oriented to room and instructed how to use call bell. Pt stated understanding. Will continue to monitor pt. Ranelle Oyster, RN

## 2019-05-20 NOTE — ED Notes (Signed)
Patient transported to CT 

## 2019-05-20 NOTE — H&P (Signed)
GASTROENTEROLOGY PROCEDURE H&P NOTE   Primary Care Physician: Mack Hook, MD  HPI: Stephen Miranda is a 48 y.o. male who presents for EGD.  Past Medical History:  Diagnosis Date  . Chronic insomnia 05/23/2016   Has stated in past since age 56 yo, but worse in recent years with restless legs.  . Elevated liver enzymes 04/24/2015   04/18/2015:  AST:  115     ALT:  90. Likely due to alcohol intake  . 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 Sarajane Jews, [facility]  . Restless legs 05/23/2016  . Seborrheic dermatitis 05/23/2016   Past Surgical History:  Procedure Laterality Date  . Excision Malignant Melanoma Left 02/07/2015   Left upper back   Current Facility-Administered Medications  Medication Dose Route Frequency Provider Last Rate Last Dose  . [MAR Hold] 0.9 %  sodium chloride infusion (Manually program via Guardrails IV Fluids)   Intravenous Once Debbe Odea, MD      . Doug Sou Hold] cefTRIAXone (ROCEPHIN) 1 g in sodium chloride 0.9 % 100 mL IVPB  1 g Intravenous Q24H Opyd, Ilene Qua, MD      . Doug Sou Hold] fentaNYL (SUBLIMAZE) injection 25-50 mcg  25-50 mcg Intravenous Q2H PRN Opyd, Ilene Qua, MD      . Doug Sou Hold] folic acid (FOLVITE) tablet 1 mg  1 mg Oral Daily Opyd, Ilene Qua, MD   Stopped at 05/20/19 [phone]  . [MAR Hold] LORazepam (ATIVAN) injection 0-4 mg  0-4 mg Intravenous Q4H Opyd, Ilene Qua, MD   Stopped at 05/20/19 0306   Followed by  . [MAR Hold] LORazepam (ATIVAN) injection 0-4 mg  0-4 mg Intravenous Q8H Opyd, Ilene Qua, MD      . Doug Sou Hold] LORazepam (ATIVAN) tablet 1-4 mg  1-4 mg Oral Q1H PRN Opyd, Ilene Qua, MD       Or  . Doug Sou Hold] LORazepam (ATIVAN) injection 1-4 mg  1-4 mg Intravenous Q1H PRN Opyd, Ilene Qua, MD      . Doug Sou Hold] metoCLOPramide (REGLAN) injection 10 mg  10 mg Intravenous Once Willia Craze, NP      . Doug Sou Hold] metoCLOPramide (REGLAN) injection 10 mg  10 mg Intravenous Once Willia Craze,  NP      . Doug Sou Hold] multivitamin with minerals tablet 1 tablet  1 tablet Oral Daily Opyd, Ilene Qua, MD   Stopped at 05/20/19 0945  . [MAR Hold] nicotine (NICODERM CQ - dosed in mg/24 hours) patch 14 mg  14 mg Transdermal Daily Debbe Odea, MD   14 mg at 05/20/19 0952  . octreotide (SANDOSTATIN) 500 mcg in sodium chloride 0.9 % 250 mL (2 mcg/mL) infusion  50 mcg/hr Intravenous Continuous Opyd, Ilene Qua, MD 25 mL/hr at 05/19/19 2104 50 mcg/hr at 05/19/19 2104  . [MAR Hold] ondansetron (ZOFRAN) tablet 4 mg  4 mg Oral Q6H PRN Opyd, Ilene Qua, MD       Or  . Doug Sou Hold] ondansetron (ZOFRAN) injection 4 mg  4 mg Intravenous Q6H PRN Opyd, Ilene Qua, MD   4 mg at 05/20/19 0041  . pantoprazole (PROTONIX) 80 mg in sodium chloride 0.9 % 250 mL (0.32 mg/mL) infusion  8 mg/hr Intravenous Continuous Opyd, Ilene Qua, MD 25 mL/hr at 05/19/19 2015 8 mg/hr at 05/19/19 2015  . [MAR Hold] sodium chloride flush (NS) 0.9 % injection 3 mL  3 mL Intravenous Q12H Opyd, Ilene Qua, MD      . Doug Sou  Hold] thiamine (VITAMIN B-1) tablet 100 mg  100 mg Oral Daily Opyd, Ilene Qua, MD   Stopped at 05/20/19 0946   Or  . Doug Sou Hold] thiamine (B-1) injection 100 mg  100 mg Intravenous Daily Opyd, Ilene Qua, MD   100 mg at 05/20/19 0951   No Known Allergies Family History  Problem Relation Age of Onset  . Fibromyalgia Mother    Social History   Socioeconomic History  . Marital status: Single    Spouse name: Not on file  . Number of children: 0  . 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 Highest education level: Not on file  Occupational History  . Occupation: Works at [employer]: Previously worked at [employer]  Social Needs  . Financial resource strain: Not on file  . Food insecurity    Worry: Not on file    Inability: Not on file  . Transportation needs    Medical: Not on file    Non-medical: Not on file  Tobacco Use  . Smoking status: Current Every Day Smoker     Packs/day: 0.50    Years: 20.00    Pack years: 10.00    Types: Cigarettes  . Smokeless tobacco: Never Used  . Tobacco comment: prescribed Chantix in past, but did not take  Substance and Sexual Activity  . Alcohol use: Yes    Alcohol/week: 4.0 standard drinks    Types: 2 Cans of beer, 2 Shots of liquor per week    Comment: Nightly  . Drug use: No  . Sexual activity: Not on file  Lifestyle  . Physical activity    Days per week: Not on file    Minutes per session: Not on file  . Stress: Not on file  Relationships  . Social Herbalist on phone: Not on file    Gets together: Not on file    Attends religious service: Not on file    Active member of club or organization: Not on file    Attends meetings of clubs or organizations: Not on file    Relationship status: Not on file  . Intimate partner violence    Fear of current or ex partner: Not on file    Emotionally abused: Not on file    Physically abused: Not on file    Forced sexual activity: Not on file  Other Topics Concern  . Not on file  Social History Narrative   Currently lives with parents    Physical Exam: Vital signs in last 24 hours: Temp:  [98.3 F (36.8 C)-98.8 F (37.1 C)] 98.3 F (36.8 C) (10/29 1255) Pulse Rate:  [81-127] 93 (10/29 1255) Resp:  [9-20] 12 (10/29 1255) BP: (98-141)/(55-116) 107/63 (10/29 1255) SpO2:  [89 %-99 %] 95 % (10/29 1255) Weight:  [83.9 kg] 83.9 kg (10/29 1255)   GEN: NAD EYE: Sclerae anicteric ENT: MMM CV: Tachycardic GI: Soft, TTP throughout midepigastrium NEURO:  Alert & Oriented x 3  Lab Results: Recent Labs    05/19/19 1653 05/20/19 0041 05/20/19 0521  WBC 8.6  --   --   HGB 10.1* 8.4* 7.6*  HCT 31.6* 25.5* 24.1*  PLT 66*  --   --    BMET Recent Labs    05/19/19 1653 05/20/19 0521  NA 138 139  K 3.6 3.7  CL 100 104  CO2 16* 27  GLUCOSE 136* 127*  BUN 13 13  CREATININE 0.72 0.68  CALCIUM 8.5* 7.8*   LFT Recent Labs    05/20/19 0521   PROT 5.2*  ALBUMIN 2.5*  AST 83*  ALT 25  ALKPHOS 41  BILITOT 2.5*   PT/INR Recent Labs    05/19/19 1844 05/20/19 0521  LABPROT 18.8* 19.9*  INR 1.6* 1.7*     Impression / Plan: This is a 48 y.o.male who presents for EGD.  The risks and benefits of endoscopic evaluation were discussed with the patient; these include but are not limited to the risk of perforation, infection, bleeding, missed lesions, lack of diagnosis, severe illness requiring hospitalization, as well as anesthesia and sedation related illnesses.  The patient is agreeable to proceed.    Justice Britain, MD [facility] # [phone]

## 2019-05-20 NOTE — Progress Notes (Signed)
Notified Dr. Linda Hedges that pt had medium size black stool. No new orders given. Will continue to monitor pt. Ranelle Oyster, RN

## 2019-05-20 NOTE — Consult Note (Signed)
Referring Provider:    [facility]       Primary Care Physician:  Mack Hook, MD Primary Gastroenterologist: Mack Hook, MD           Reason for Consultation:    GI bleed               ASSESSMENT /  PLAN    38.  48 year old male with upper GI bleed.  Blood pressure okay overall but he has been tachycardic in the 120s, now improved with IV fluids. Given thrombocytopenia, coagulopathy, hx of Etoh I suspect he has cirrhosis and this could be a variceal bleed.  -Agree with sandostatin -Agree with PPI gtt -Agree with empiric Rocephin but doesn't appear to have ascites.  -[place] at some point to assess liver -Keep NPO, he will get EGD later today. The risks and benefits of EGD were discussed and the patient agrees to proceed.  -Will give dose of IV Reglan now then an hour prior to EGD -Will give dose of subq Vit K now.  -CIWA protocol. EtOH slightly elevated at 12 -Viral hepatitis panel pending. If [place] tomorrow confirms cirrhosis then will check additional labs to rule out autoimmune/genetic/metabolic etiologies of chronic liver disease   2. ABL anemia. Baseline hemoglobin 16, he is currently at 7.6 -Unit of PRBC infusing now -Monitor H+H, transfuse as necessary. If hgb still > 7 on post-transfusion H+H would hold off on second unit. .   3. SARS negative   HPI:     Stephen Miranda is a 48 y.o. male with PMH significant for melanoma, alcohol use with history of elevated liver enzymes and depression.  Patient said that he was feeling absolutely fine yesterday prior to bedtime.  He had not had any abdominal pain, no nausea, no vomiting, his weight has been stable.Marland Kitchen He woke up in the middle of the night with mid abdominal pain, nausea vomiting and hematemesis.  Emesis was bright red blood, eventually turned to coffee-ground material.  He has subsequently had a very dark bowel movement.  Booth takes no NSAIDs.  He takes no medications at home.  No history of PUD.  Colesyn is he drinks 1-2  beers a day but used to drink more heavily.  No bowel movement since yesterday but he has apparently had ongoing hematemesis in the ED.   Past Medical History:  Diagnosis Date  . Chronic insomnia 05/23/2016   Has stated in past since age 55 yo, but worse in recent years with restless legs.  . Elevated liver enzymes 04/24/2015   04/18/2015:  AST:  115     ALT:  90. Likely due to alcohol intake  . 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 Sarajane Jews, [facility]  . Restless legs 05/23/2016  . Seborrheic dermatitis 05/23/2016    Past Surgical History:  Procedure Laterality Date  . Excision Malignant Melanoma Left 02/07/2015   Left upper back    Prior to Admission medications   Medication Sig Start Date End Date Taking? Authorizing Provider  gabapentin (NEURONTIN) 100 MG capsule Titrate to 6 caps by mouth at bedtime nightly Patient not taking: Reported on 05/19/2019 09/06/16   Mack Hook, MD    Current Facility-Administered Medications  Medication Dose Route Frequency Provider Last Rate Last Dose  . 0.9 %  sodium chloride infusion (Manually program via Guardrails IV Fluids)   Intravenous Once Debbe Odea, MD      . cefTRIAXone (ROCEPHIN) 1 g in sodium  chloride 0.9 % 100 mL IVPB  1 g Intravenous Q24H Opyd, Timothy S, MD      . fentaNYL (SUBLIMAZE) injection 25-50 mcg  25-50 mcg Intravenous Q2H PRN Opyd, Ilene Qua, MD      . folic acid (FOLVITE) tablet 1 mg  1 mg Oral Daily Opyd, Ilene Qua, MD      . LORazepam (ATIVAN) injection 0-4 mg  0-4 mg Intravenous Q4H Opyd, Ilene Qua, MD   Stopped at 05/20/19 0306   Followed by  . [START ON 05/22/2019] LORazepam (ATIVAN) injection 0-4 mg  0-4 mg Intravenous Q8H Opyd, Timothy S, MD      . LORazepam (ATIVAN) tablet 1-4 mg  1-4 mg Oral Q1H PRN Opyd, Ilene Qua, MD       Or  . LORazepam (ATIVAN) injection 1-4 mg  1-4 mg Intravenous Q1H PRN Opyd, Ilene Qua, MD      . multivitamin with minerals  tablet 1 tablet  1 tablet Oral Daily Opyd, Ilene Qua, MD      . nicotine (NICODERM CQ - dosed in mg/24 hours) patch 14 mg  14 mg Transdermal Daily Rizwan, Saima, MD      . octreotide (SANDOSTATIN) 500 mcg in sodium chloride 0.9 % 250 mL (2 mcg/mL) infusion  50 mcg/hr Intravenous Continuous Opyd, Ilene Qua, MD 25 mL/hr at 05/19/19 2104 50 mcg/hr at 05/19/19 2104  . ondansetron (ZOFRAN) tablet 4 mg  4 mg Oral Q6H PRN Opyd, Ilene Qua, MD       Or  . ondansetron (ZOFRAN) injection 4 mg  4 mg Intravenous Q6H PRN Opyd, Ilene Qua, MD   4 mg at 05/20/19 0041  . pantoprazole (PROTONIX) 80 mg in sodium chloride 0.9 % 250 mL (0.32 mg/mL) infusion  8 mg/hr Intravenous Continuous Opyd, Ilene Qua, MD 25 mL/hr at 05/19/19 2015 8 mg/hr at 05/19/19 2015  . phytonadione (VITAMIN K) 10 mg in dextrose 5 % 50 mL IVPB  10 mg Intravenous Once Mansouraty, Telford Nab., MD 50 mL/hr at 05/20/19 0837 10 mg at 05/20/19 0837  . sodium chloride flush (NS) 0.9 % injection 3 mL  3 mL Intravenous Q12H Opyd, Ilene Qua, MD      . thiamine (VITAMIN B-1) tablet 100 mg  100 mg Oral Daily Opyd, Ilene Qua, MD       Or  . thiamine (B-1) injection 100 mg  100 mg Intravenous Daily Opyd, Ilene Qua, MD       Current Outpatient Medications  Medication Sig Dispense Refill  . gabapentin (NEURONTIN) 100 MG capsule Titrate to 6 caps by mouth at bedtime nightly (Patient not taking: Reported on 05/19/2019) 120 capsule 3    Allergies as of 05/19/2019  . (No Known Allergies)    Family History  Problem Relation Age of Onset  . Fibromyalgia Mother     Social History   Socioeconomic History  . Marital status: Single    Spouse name: Not on file  . Number of children: 0  . 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 Highest education level: Not on file  Occupational History  . Occupation: Works at [employer]: Previously worked at [employer]  Social Needs  . Financial resource strain: Not on  file  . Food insecurity    Worry: Not on file    Inability: Not on file  . Transportation needs    Medical: Not on file    Non-medical: Not on  file  Tobacco Use  . Smoking status: Current Every Day Smoker    Packs/day: 0.50    Years: 20.00    Pack years: 10.00    Types: Cigarettes  . Smokeless tobacco: Never Used  . Tobacco comment: prescribed Chantix in past, but did not take  Substance and Sexual Activity  . Alcohol use: Yes    Alcohol/week: 4.0 standard drinks    Types: 2 Cans of beer, 2 Shots of liquor per week    Comment: Nightly  . Drug use: No  . Sexual activity: Not on file  Lifestyle  . Physical activity    Days per week: Not on file    Minutes per session: Not on file  . Stress: Not on file  Relationships  . Social Herbalist on phone: Not on file    Gets together: Not on file    Attends religious service: Not on file    Active member of club or organization: Not on file    Attends meetings of clubs or organizations: Not on file    Relationship status: Not on file  . Intimate partner violence    Fear of current or ex partner: Not on file    Emotionally abused: Not on file    Physically abused: Not on file    Forced sexual activity: Not on file  Other Topics Concern  . Not on file  Social History Narrative   Currently lives with parents    Review of Systems: All systems reviewed and negative except where noted in HPI.  Physical Exam: Vital signs in last 24 hours: Temp:  [98.3 F (36.8 C)-98.8 F (37.1 C)] 98.5 F (36.9 C) (10/29 0859) Pulse Rate:  [89-127] 94 (10/29 0859) Resp:  [10-20] 16 (10/29 0859) BP: (99-141)/(55-116) 99/65 (10/29 0859) SpO2:  [89 %-99 %] 97 % (10/29 0859) Weight:  [83.9 kg] 83.9 kg (10/28 1842)   General:   Alert, well-developed,  male in NAD Psych:  Pleasant, cooperative. Normal mood and affect. Eyes:  Pupils equal, sclera clear, no icterus.   Conjunctiva pink. Ears:  Normal auditory acuity. Nose:  No  deformity, discharge,  or lesions. Neck:  Supple; no masses Lungs:  Clear throughout to auscultation.   No wheezes, crackles, or rhonchi.  Heart:  Regular rate and rhythm; no murmurs, no lower extremity edema Abdomen:  Soft, non-distended, mild epigastric tenderness, BS active, no palp mass   Rectal:  Deferred  Msk:  Symmetrical without gross deformities. . Neurologic:  Alert and  oriented x4;  grossly normal neurologically. Skin:  Intact without significant lesions or rashes.   Intake/Output from previous day: No intake/output data recorded. Intake/Output this shift: No intake/output data recorded.  Lab Results: Recent Labs    05/19/19 1653 05/20/19 0041 05/20/19 0521  WBC 8.6  --   --   HGB 10.1* 8.4* 7.6*  HCT 31.6* 25.5* 24.1*  PLT 66*  --   --    BMET Recent Labs    05/19/19 1653 05/20/19 0521  NA 138 139  K 3.6 3.7  CL 100 104  CO2 16* 27  GLUCOSE 136* 127*  BUN 13 13  CREATININE 0.72 0.68  CALCIUM 8.5* 7.8*   LFT Recent Labs    05/20/19 0521  PROT 5.2*  ALBUMIN 2.5*  AST 83*  ALT 25  ALKPHOS 41  BILITOT 2.5*   PT/INR Recent Labs    05/19/19 1844 05/20/19 0521  LABPROT 18.8* 19.9*  INR 1.6* 1.7*  Hepatitis Panel No results for input(s): HEPBSAG, HCVAB, HEPAIGM, HEPBIGM in the last 72 hours.   . CBC Latest Ref Rng & Units 05/20/2019 05/20/2019 05/19/2019  WBC 4.0 - 10.5 K/uL - - 8.6  Hemoglobin 13.0 - 17.0 g/dL 7.6(L) 8.4(L) 10.1(L)  Hematocrit 39.0 - 52.0 % 24.1(L) 25.5(L) 31.6(L)  Platelets 150 - 400 K/uL - - 66(L)    . CMP Latest Ref Rng & Units 05/20/2019 05/19/2019 05/27/2018  Glucose 70 - 99 mg/dL 127(H) 136(H) 101(H)  BUN 6 - 20 mg/dL 13 13 5(L)  Creatinine 0.61 - 1.24 mg/dL 0.68 0.72 1.20  Sodium 135 - 145 mmol/L 139 138 139  Potassium 3.5 - 5.1 mmol/L 3.7 3.6 3.8  Chloride 98 - 111 mmol/L 104 100 101  CO2 22 - 32 mmol/L 27 16(L) -  Calcium 8.9 - 10.3 mg/dL 7.8(L) 8.5(L) -  Total Protein 6.5 - 8.1 g/dL 5.2(L) 6.7 -  Total  Bilirubin 0.3 - 1.2 mg/dL 2.5(H) 2.6(H) -  Alkaline Phos 38 - 126 U/L 41 63 -  AST 15 - 41 U/L 83(H) 97(H) -  ALT 0 - 44 U/L 25 29 -   Studies/Results: Dg Chest Port 1 View  Result Date: 05/19/2019 CLINICAL DATA:  Chest pain nausea vomiting EXAM: PORTABLE CHEST 1 VIEW COMPARISON:  None. FINDINGS: The heart size and mediastinal contours are within normal limits. Both lungs are clear. The visualized skeletal structures are unremarkable. IMPRESSION: No active disease. Electronically Signed   By: Donavan Foil M.D.   On: 05/19/2019 19:27    Principal Problem:   Acute upper GI bleeding Active Problems:   Elevated liver enzymes   Macrocytic anemia   Thrombocytopenia ([facility])   Coagulopathy ([place])   History of alcohol abuse    Tye Savoy, NP-C @  05/20/2019, 9:04 AM

## 2019-05-20 NOTE — Anesthesia Procedure Notes (Addendum)
Procedure Name: Intubation Performed by: Milford Cage, CRNA Pre-anesthesia Checklist: Patient identified, Emergency Drugs available, Suction available and Patient being monitored Patient Re-evaluated:Patient Re-evaluated prior to induction Oxygen Delivery Method: Circle System Utilized Preoxygenation: Pre-oxygenation with 100% oxygen Induction Type: IV induction and Rapid sequence Laryngoscope Size: Glidescope and 4 Grade View: Grade I Tube type: Oral Tube size: 7.5 mm Number of attempts: 1 Airway Equipment and Method: Video-laryngoscopy and Stylet Placement Confirmation: ETT inserted through vocal cords under direct vision,  positive ETCO2 and breath sounds checked- equal and bilateral Secured at: 23 cm Tube secured with: Tape Dental Injury: Teeth and Oropharynx as per pre-operative assessment  Comments: Glidescope d/t positioning

## 2019-05-20 NOTE — Transfer of Care (Signed)
Immediate Anesthesia Transfer of Care Note  Patient: Stephen Miranda  Procedure(s) Performed: ESOPHAGOGASTRODUODENOSCOPY (EGD) WITH PROPOFOL (N/A )  Patient Location: PACU  Anesthesia Type:General  Level of Consciousness: awake  Airway & Oxygen Therapy: Patient Spontanous Breathing and Patient connected to nasal cannula oxygen  Post-op Assessment: Report given to RN and Post -op Vital signs reviewed and stable  Post vital signs: Reviewed and stable  Last Vitals:  Vitals Value Taken Time  BP    Temp    Pulse    Resp    SpO2      Last Pain:  Vitals:   05/20/19 1255  TempSrc: Oral  PainSc: 4          Complications: No apparent anesthesia complications

## 2019-05-20 NOTE — Progress Notes (Addendum)
PROGRESS NOTE    Stephen Miranda   [id number]  DOB: 1970/12/23  DOA: 05/19/2019 PCP: Mack Hook, MD   Brief Narrative:  Stephen Miranda is a 48 y.o. male with medical history significant for elevated liver enzymes, thrombocytopenia, and malignant melanoma of his back status post excision, now presenting to the emergency department with abdominal pain, hematemesis, and melena since 3 AM on 10/28. He had multiple episodes of hematemesis and dark stool. H/o heavy ETOH abuse in the past but now drinks 1 beer a day for many months.  In ED> HR 130, FOB +, LFTS elevated, Hb 10.1 and platelets 66. Started on Protonix and Octreotide.    Subjective: Having mild to moderate mid abdominal pain. No other complaints.     Assessment & Plan:   Principal Problem:   Acute upper GI bleeding - ? Due to esophagitis, PUD vs due to varices - GI plans for an EGD today - cont Octreotide and Protonix infusions  Active Problems:   Elevated liver enzymes - due to alcohol abuse -   liver ultrasound ordered by GI    Macrocytic anemia with acute blood loss anemia - HB dropped from 10 to 7.6 - he was ordered 1 U PRBC - check anemia panel - macrocytosis likely due to ETOH    Thrombocytopenia     Coagulopathy  - Platelets 66 and INR 1.6 - 10 U Vit K given-  - also likely due to underlying liver disease - check anemia panel    History of alcohol abuse - he stated to me that he only drinks 1 of the 12 oz cans of beer a day- will cont CIWA scale over the next 2 days to see if he has withdrawal symptoms  Cigarette smoker - smokes 1/2 ppd- Nicotine patch ordered and smoking cessation advised  Time spent in minutes: 35 DVT prophylaxis: SCDs Code Status: Full code Family Communication:  Disposition Plan: home  Consultants:   GI Procedures:    Antimicrobials:  Anti-infectives (From admission, onward)   Start     Dose/Rate Route Frequency Ordered Stop   05/20/19 2000  [MAR Hold]  cefTRIAXone  (ROCEPHIN) 1 g in sodium chloride 0.9 % 100 mL IVPB     (MAR Hold since Thu 05/20/2019 at 1253.Hold Reason: Transfer to a Procedural area.)   1 g 200 mL/hr over 30 Minutes Intravenous Every 24 hours 05/19/19 2049     05/19/19 1945  cefTRIAXone (ROCEPHIN) 2 g in sodium chloride 0.9 % 100 mL IVPB     2 g 200 mL/hr over 30 Minutes Intravenous  Once 05/19/19 1943 05/19/19 2048       Objective: Vitals:   05/20/19 1046 05/20/19 1100 05/20/19 1130 05/20/19 1255  BP: 105/68 101/69 108/63 107/63  Pulse: 91 87 91 93  Resp: 16 11 12 12   Temp: 98.6 F (37 C)   98.3 F (36.8 C)  TempSrc: Oral   Oral  SpO2: 98% 97% 96% 95%  Weight:    83.9 kg  Height:    6\' 4"  (1.93 m)    Intake/Output Summary (Last 24 hours) at 05/20/2019 1330 Last data filed at 05/20/2019 1046 Gross per 24 hour  Intake 332 ml  Output -  Net 332 ml   Filed Weights   05/19/19 1842 05/20/19 1255  Weight: 83.9 kg 83.9 kg    Examination: General exam: Appears comfortable  HEENT: PERRLA, oral mucosa moist, no sclera icterus or thrush Respiratory system: Clear to auscultation. Respiratory effort normal. Cardiovascular  system: S1 & S2 heard, RRR.   Gastrointestinal system: Abdomen soft, midly-tender in mid abdomen, nondistended. Normal bowel sounds. Central nervous system: Alert and oriented. No focal neurological deficits. Extremities: No cyanosis, clubbing - mild edema of legs with petechia which he states are from work boots Skin: No rashes or ulcers Psychiatry:  Mood & affect appropriate.     Data Reviewed: I have personally reviewed following labs and imaging studies  CBC: Recent Labs  Lab 05/19/19 1653 05/20/19 0041 05/20/19 0521  WBC 8.6  --   --   HGB 10.1* 8.4* 7.6*  HCT 31.6* 25.5* 24.1*  MCV 107.5*  --   --   PLT 66*  --   --    Basic Metabolic Panel: Recent Labs  Lab 05/19/19 1653 05/20/19 0521  NA 138 139  K 3.6 3.7  CL 100 104  CO2 16* 27  GLUCOSE 136* 127*  BUN 13 13  CREATININE  0.72 0.68  CALCIUM 8.5* 7.8*  MG  --  1.5*  PHOS  --  2.6   GFR: Estimated Creatinine Clearance: 135.5 mL/min (by C-G formula based on SCr of 0.68 mg/dL). Liver Function Tests: Recent Labs  Lab 05/19/19 1653 05/20/19 0521  AST 97* 83*  ALT 29 25  ALKPHOS 63 41  BILITOT 2.6* 2.5*  PROT 6.7 5.2*  ALBUMIN 3.1* 2.5*   No results for input(s): LIPASE, AMYLASE in the last 168 hours. No results for input(s): AMMONIA in the last 168 hours. Coagulation Profile: Recent Labs  Lab 05/19/19 1844 05/20/19 0521  INR 1.6* 1.7*   Cardiac Enzymes: No results for input(s): CKTOTAL, CKMB, CKMBINDEX, TROPONINI in the last 168 hours. BNP (last 3 results) No results for input(s): PROBNP in the last 8760 hours. HbA1C: No results for input(s): HGBA1C in the last 72 hours. CBG: Recent Labs  Lab 05/20/19 0912  GLUCAP 118*   Lipid Profile: No results for input(s): CHOL, HDL, LDLCALC, TRIG, CHOLHDL, LDLDIRECT in the last 72 hours. Thyroid Function Tests: No results for input(s): TSH, T4TOTAL, FREET4, T3FREE, THYROIDAB in the last 72 hours. Anemia Panel: No results for input(s): VITAMINB12, FOLATE, FERRITIN, TIBC, IRON, RETICCTPCT in the last 72 hours. Urine analysis: No results found for: COLORURINE, APPEARANCEUR, LABSPEC, PHURINE, GLUCOSEU, HGBUR, BILIRUBINUR, KETONESUR, PROTEINUR, UROBILINOGEN, NITRITE, LEUKOCYTESUR Sepsis Labs: @LABRCNTIP (procalcitonin:4,lacticidven:4) ) Recent Results (from the past 240 hour(s))  SARS Coronavirus 2 by RT PCR (hospital order, performed in [facility] hospital lab) Nasopharyngeal Nasopharyngeal Swab     Status: None   Collection Time: 05/19/19  7:03 PM   Specimen: Nasopharyngeal Swab  Result Value Ref Range Status   SARS Coronavirus 2 NEGATIVE NEGATIVE Final    Comment: (NOTE) If result is NEGATIVE SARS-CoV-2 target nucleic acids are NOT DETECTED. The SARS-CoV-2 RNA is generally detectable in upper and lower  respiratory specimens during the acute  phase of infection. The lowest  concentration of SARS-CoV-2 viral copies this assay can detect is 250  copies / mL. A negative result does not preclude SARS-CoV-2 infection  and should not be used as the sole basis for treatment or other  patient management decisions.  A negative result may occur with  improper specimen collection / handling, submission of specimen other  than nasopharyngeal swab, presence of viral mutation(s) within the  areas targeted by this assay, and inadequate number of viral copies  (<250 copies / mL). A negative result must be combined with clinical  observations, patient history, and epidemiological information. If result is POSITIVE SARS-CoV-2 target  nucleic acids are DETECTED. The SARS-CoV-2 RNA is generally detectable in upper and lower  respiratory specimens dur ing the acute phase of infection.  Positive  results are indicative of active infection with SARS-CoV-2.  Clinical  correlation with patient history and other diagnostic information is  necessary to determine patient infection status.  Positive results do  not rule out bacterial infection or co-infection with other viruses. If result is PRESUMPTIVE POSTIVE SARS-CoV-2 nucleic acids MAY BE PRESENT.   A presumptive positive result was obtained on the submitted specimen  and confirmed on repeat testing.  While 2019 novel coronavirus  (SARS-CoV-2) nucleic acids may be present in the submitted sample  additional confirmatory testing may be necessary for epidemiological  and / or clinical management purposes  to differentiate between  SARS-CoV-2 and other Sarbecovirus currently known to infect humans.  If clinically indicated additional testing with an alternate test  methodology [phone]) is advised. The SARS-CoV-2 RNA is generally  detectable in upper and lower respiratory sp ecimens during the acute  phase of infection. The expected result is Negative. Fact Sheet for Patients:   StrictlyIdeas.no Fact Sheet for Healthcare Providers: BankingDealers.co.za This test is not yet approved or cleared by the [place] FDA and has been authorized for detection and/or diagnosis of SARS-CoV-2 by FDA under an Emergency Use Authorization (EUA).  This EUA will remain in effect (meaning this test can be used) for the duration of the COVID-19 declaration under Section 564(b)(1) of the Act, 21 U.S.C. section 360bbb-3(b)(1), unless the authorization is terminated or revoked sooner. Performed at [facility], [place] [address], [place], [place] [address]          Radiology Studies: Dg Chest Port 1 View  Result Date: 05/19/2019 CLINICAL DATA:  Chest pain nausea vomiting EXAM: PORTABLE CHEST 1 VIEW COMPARISON:  None. FINDINGS: The heart size and mediastinal contours are within normal limits. Both lungs are clear. The visualized skeletal structures are unremarkable. IMPRESSION: No active disease. Electronically Signed   By: Donavan Foil M.D.   On: 05/19/2019 19:27      Scheduled Meds: . [MAR Hold] sodium chloride   Intravenous Once  . [MAR Hold] folic acid  1 mg Oral Daily  . [MAR Hold] LORazepam  0-4 mg Intravenous Q4H   Followed by  . [MAR Hold] LORazepam  0-4 mg Intravenous Q8H  . [MAR Hold] metoCLOPramide (REGLAN) injection  10 mg Intravenous Once  . [MAR Hold] metoCLOPramide (REGLAN) injection  10 mg Intravenous Once  . [MAR Hold] multivitamin with minerals  1 tablet Oral Daily  . [MAR Hold] nicotine  14 mg Transdermal Daily  . [MAR Hold] sodium chloride flush  3 mL Intravenous Q12H  . [MAR Hold] thiamine  100 mg Oral Daily   Or  . [MAR Hold] thiamine  100 mg Intravenous Daily   Continuous Infusions: . [MAR Hold] cefTRIAXone (ROCEPHIN)  IV    . octreotide  (SANDOSTATIN)    IV infusion 50 mcg/hr (05/19/19 2104)  . pantoprozole (PROTONIX) infusion 8 mg/hr (05/19/19 2015)     LOS: 0 days      Debbe Odea, MD [facility] Pager: [email or web address] Password [email or web address] 05/20/2019, 1:30 PM

## 2019-05-20 NOTE — Op Note (Signed)
[facility] Patient Name: Stephen Miranda Procedure Date : 05/20/2019 MRN: [id number] Attending MD: Justice Britain , MD Date of Birth: [birth year] CSN: [id number] Age: 48 Admit Type: Inpatient Procedure:                Upper GI endoscopy Indications:              Acute post hemorrhagic anemia, Portal hypertension                            with UGI bleeding and suspected esophageal varices,                            Variceal screening (no known varices or prior                            bleeding) Providers:                Justice Britain, MD, Carlyn Reichert, RN, Cherylynn Ridges, Technician, Lerry Paterson, CRNA Referring MD:             [facility] Medicines:                General Anesthesia Complications:            No immediate complications. Estimated Blood Loss:     Estimated blood loss: none. Procedure:                Pre-Anesthesia Assessment:                           - Prior to the procedure, a History and Physical                            was performed, and patient medications and                            allergies were reviewed. The patient's tolerance of                            previous anesthesia was also reviewed. The risks                            and benefits of the procedure and the sedation                            options and risks were discussed with the patient.                            All questions were answered, and informed consent                            was obtained. Prior Anticoagulants: The patient has  taken no previous anticoagulant or antiplatelet                            agents. ASA Grade Assessment: III - A patient with                            severe systemic disease. After reviewing the risks                            and benefits, the patient was deemed in                            satisfactory condition to undergo the procedure.  After obtaining informed consent, the endoscope was                            passed under direct vision. Throughout the                            procedure, the patient's blood pressure, pulse, and                            oxygen saturations were monitored continuously. The                            [id number] ([id number]) Olympus therapeutic                            gastroscope was introduced through the mouth, and                            advanced to the third part of duodenum. The upper                            GI endoscopy was accomplished without difficulty.                            The patient tolerated the procedure. Scope In: Scope Out: Findings:      No gross lesions were noted in the proximal esophagus.      Three columns of non-bleeding grade II, large (> 5 mm) varices were       found in the mid esophagus and in the distal esophagus,. No stigmata of       recent bleeding were evident and no red wale signs were present.      Circumferential salmon-colored mucosa was present from 32 to 38 cm. No       other visible abnormalities were present. The maximum longitudinal       extent of these esophageal mucosal changes was 6 cm in length.      A 3 cm hiatal hernia was present.      Type 1 isolated gastric varices (IGV1, varices located in the fundus)       with no bleeding were found in the gastric fundus. There were no       stigmata of recent bleeding. They were medium in largest diameter.  Moderate portal hypertensive gastropathy was found in the entire       examined stomach.      There is no endoscopic evidence of ulceration in the entire examined       stomach.      A large (> 5 mm) varix was found in the first portion of the duodenum       and in the second portion of the duodenum.      Normal mucosa was found in the duodenal bulb, in the first portion of       the duodenum, in the second portion of the duodenum and in the third       portion of the  duodenum. Impression:               - No gross lesions in esophagus.                           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                           - Salmon-colored mucosa suggestive of long-segment                            Barrett's esophagus.                           - 3 cm hiatal hernia.                           - Type 1 isolated gastric varices (IGV1, varices                            located in the fundus), without bleeding.                           - Portal hypertensive gastropathy.                           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                           - Normal mucosa was found in the duodenal bulb, in                            the first portion of the duodenum, in the second                            portion of the duodenum and in the third portion of                            the duodenum. Recommendation:           - The patient will be observed post-procedure,  until all discharge criteria are met.                           - Return patient to hospital ward for ongoing care.                           - Continue Octreotide x 72 hours for now.                           - Transition to IV PPI BID x 72 hours.                           - Ceftriaxone x 5-day course.                           - Obtain CTAP with IV/PO contrast Triple phase to                            better define liver architecture and also defice                            possible varices of the stomach and small bowel.                           - If any evidence of recurrent coffee-ground                            emesis/hematemesis, then please call and discuss                            with oncall GI service as a repeat EGD will need to                            be considered. I will consider possible empiric                            banding of the esophageal varices vs BB prophylaxis                             in the next 24-48 hours as we gather more                            information.                           - CLD for next 24 hours.                           - The findings and recommendations were discussed                            with the patient. Procedure Code(s):        --- Professional ---  86767, Esophagogastroduodenoscopy, flexible,                            transoral; diagnostic, including collection of                            specimen(s) by brushing or washing, when performed                            (separate procedure) Diagnosis Code(s):        --- Professional ---                           K22.8, Other specified diseases of esophagus                           K76.6, Portal hypertension                           I85.10, Secondary esophageal varices without                            bleeding                           K44.9, Diaphragmatic hernia without obstruction or                            gangrene                           I86.4, Gastric varices                           K31.89, Other diseases of stomach and duodenum                           I86.8, Varicose veins of other specified sites                           D62, Acute posthemorrhagic anemia                           K92.2, Gastrointestinal hemorrhage, unspecified                           Z13.810, Encounter for screening for upper                            gastrointestinal disorder CPT copyright 2019 American Medical Association. All rights reserved. The codes documented in this report are preliminary and upon coder review may  be revised to meet current compliance requirements. Justice Britain, MD 05/20/2019 4:23:03 PM Number of Addenda: 0

## 2019-05-20 NOTE — ED Notes (Signed)
Pt placed on regular hospital bed for comfort.  Pt got very tired and slightly dizzy when he stood to change beds.  Also complained of nausea.  RN will medicate.

## 2019-05-20 NOTE — ED Notes (Signed)
Blood bank called, unit is ready.

## 2019-05-20 NOTE — ED Notes (Signed)
Pt is sinus tach on monitor 

## 2019-05-20 NOTE — Anesthesia Preprocedure Evaluation (Signed)
Anesthesia Evaluation  Patient identified by MRN, date of birth, ID band Patient awake    Reviewed: Allergy & Precautions, NPO status , Patient's Chart, lab work & pertinent test results  Airway Mallampati: II  TM Distance: >3 FB Neck ROM: Full    Dental no notable dental hx.    Pulmonary Current Smoker and Patient abstained from smoking.,    Pulmonary exam normal breath sounds clear to auscultation       Cardiovascular negative cardio ROS Normal cardiovascular exam Rhythm:Regular Rate:Normal     Neuro/Psych negative neurological ROS  negative psych ROS   GI/Hepatic negative GI ROS, (+)     substance abuse  alcohol use,   Endo/Other  negative endocrine ROS  Renal/GU negative Renal ROS  negative genitourinary   Musculoskeletal negative musculoskeletal ROS (+)   Abdominal   Peds negative pediatric ROS (+)  Hematology  (+) anemia , thrombocytopenia   Anesthesia Other Findings   Reproductive/Obstetrics negative OB ROS                             Anesthesia Physical Anesthesia Plan  ASA: III  Anesthesia Plan: MAC   Post-op Pain Management:    Induction: Intravenous  PONV Risk Score and Plan: 0  Airway Management Planned: Simple Face Mask  Additional Equipment:   Intra-op Plan:   Post-operative Plan:   Informed Consent: I have reviewed the patients History and Physical, chart, labs and discussed the procedure including the risks, benefits and alternatives for the proposed anesthesia with the patient or authorized representative who has indicated his/her understanding and acceptance.     Dental advisory given  Plan Discussed with: CRNA and Surgeon  Anesthesia Plan Comments: (GETA if patient is vomiting or heaving secondary to nausea)        Anesthesia Quick Evaluation

## 2019-05-20 NOTE — Anesthesia Postprocedure Evaluation (Signed)
Anesthesia Post Note  Patient: Stephen Miranda  Procedure(s) Performed: ESOPHAGOGASTRODUODENOSCOPY (EGD) WITH PROPOFOL (N/A )     Patient location during evaluation: PACU Anesthesia Type: General Level of consciousness: awake and alert Pain management: pain level controlled Vital Signs Assessment: post-procedure vital signs reviewed and stable Respiratory status: spontaneous breathing, nonlabored ventilation, respiratory function stable and patient connected to nasal cannula oxygen Cardiovascular status: blood pressure returned to baseline and stable Postop Assessment: no apparent nausea or vomiting Anesthetic complications: no    Last Vitals:  Vitals:   05/20/19 1532 05/20/19 1541  BP:  121/74  Pulse: 89 88  Resp: 17 17  Temp:    SpO2: 95% 97%    Last Pain:  Vitals:   05/20/19 1541  TempSrc:   PainSc: 0-No pain                 Anacarolina Evelyn S

## 2019-05-20 NOTE — ED Notes (Signed)
Informed provider of hcg

## 2019-05-20 NOTE — ED Notes (Signed)
ED TO INPATIENT HANDOFF REPORT  ED Nurse Name and Phone #:  [phone]  S Name/Age/Gender Stephen Miranda 48 y.o. male Room/Bed: 030C/030C  Code Status   Code Status: Full Code  Home/SNF/Other Home Patient oriented to: self, place, time and situation Is this baseline? Yes   Triage Complete: Triage complete  Chief Complaint Acute post-hemorrhagic anemia [D62] Alcohol abuse [F10.10] Thrombocytopenia ([place]) [D69.6] GI bleed [K92.2] Abnormal liver function test [R94.5] COVID-19 virus not detected [Z03.818] Upper GI bleed [K92.2]  Triage Note Onset this morning vomited x 4, bright red in color, abd pain, and several black stools.     Allergies No Known Allergies  Level of Care/Admitting Diagnosis ED Disposition    ED Disposition Condition Comment   Admit  Hospital Area: [facility] [[id number]] Level of Care: Progressive [102] Admit to Progressive based on following criteria: GI, ENDOCRINE disease patients with GI bleeding, acute liver failure or pancreatitis, stable with diabetic ketoa cidosis or thyrotoxicosis (hypothyroid) state. Covid Evaluation: Asymptomatic Screening Protocol (No Symptoms) Diagnosis: Acute upper GI bleeding [id number] Admitting Physician: Vianne Bulls [id number] Attending Physician: Vianne Bulls [id number] 9] PT Class (Do Not Modify): Observation [104] PT Acc Code (Do Not Modify): Observation [10022]       B Medical/Surgery History Past Medical History:  Diagnosis Date  . Chronic insomnia 05/23/2016   Has stated in past since age 34 yo, but worse in recent years with restless legs.  . Elevated liver enzymes 04/24/2015   04/18/2015:  AST:  115     ALT:  90. Likely due to alcohol intake  . 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 Sarajane Jews, [facility]  . Restless legs 05/23/2016  . Seborrheic dermatitis 05/23/2016   Past Surgical History:  Procedure Laterality Date  .  ESOPHAGOGASTRODUODENOSCOPY (EGD) WITH PROPOFOL N/A 05/20/2019   Procedure: ESOPHAGOGASTRODUODENOSCOPY (EGD) WITH PROPOFOL;  Surgeon: Rush Landmark Telford Nab., MD;  Location: [facility];  Service: Gastroenterology;  Laterality: N/A;  . Excision Malignant Melanoma Left 02/07/2015   Left upper back     A IV Location/Drains/Wounds Patient Lines/Drains/Airways Status   Active Line/Drains/Airways    Name:   Placement date:   Placement time:   Site:   Days:   Peripheral IV 05/19/19 Right Forearm   05/19/19    1853    Forearm   1   Peripheral IV 05/19/19 Right;Upper Forearm   05/19/19    2003    Forearm   1   Peripheral IV 05/20/19 Left Forearm   05/20/19    0736    Forearm   less than 1   Peripheral IV 05/20/19 Left;Anterior Forearm   05/20/19    0838    Forearm   less than 1          Intake/Output Last 24 hours  Intake/Output Summary (Last 24 hours) at 05/20/2019 2208 Last data filed at 05/20/2019 2055 Gross per 24 hour  Intake 1382 ml  Output -  Net 1382 ml    Labs/Imaging Results for orders placed or performed during the hospital encounter of 05/19/19 (from the past 48 hour(s))  ABO/Rh     Status: None   Collection Time: 05/19/19  4:50 PM  Result Value Ref Range   ABO/RH(D)      O POS Performed at [facility], [place] [address], [place], [place] [address]   Comprehensive metabolic panel     Status: Abnormal   Collection Time: 05/19/19  4:53 PM  Result Value Ref Range   Sodium 138 135 - 145 mmol/L   Potassium 3.6 3.5 - 5.1 mmol/L   Chloride 100 98 - 111 mmol/L   CO2 16 (L) 22 - 32 mmol/L   Glucose, Bld 136 (H) 70 - 99 mg/dL   BUN 13 6 - 20 mg/dL   Creatinine, Ser 0.72 0.61 - 1.24 mg/dL   Calcium 8.5 (L) 8.9 - 10.3 mg/dL   Total Protein 6.7 6.5 - 8.1 g/dL   Albumin 3.1 (L) 3.5 - 5.0 g/dL   AST 97 (H) 15 - 41 U/L   ALT 29 0 - 44 U/L   Alkaline Phosphatase 63 38 - 126 U/L   Total Bilirubin 2.6 (H) 0.3 - 1.2 mg/dL   GFR calc non Af Amer >60 >60 mL/min   GFR calc  Af Amer >60 >60 mL/min   Anion gap 22 (H) 5 - 15    Comment: Performed at [facility], [address], [place], [place] [address]  CBC     Status: Abnormal   Collection Time: 05/19/19  4:53 PM  Result Value Ref Range   WBC 8.6 4.0 - 10.5 K/uL   RBC 2.94 (L) 4.22 - 5.81 MIL/uL   Hemoglobin 10.1 (L) 13.0 - 17.0 g/dL   HCT 31.6 (L) 39.0 - 52.0 %   MCV 107.5 (H) 80.0 - 100.0 fL   MCH 34.4 (H) 26.0 - 34.0 pg   MCHC 32.0 30.0 - 36.0 g/dL   RDW 13.4 11.5 - 15.5 %   Platelets 66 (L) 150 - 400 K/uL    Comment: REPEATED TO VERIFY PLATELET COUNT CONFIRMED BY SMEAR SPECIMEN CHECKED FOR CLOTS Immature Platelet Fraction may be clinically indicated, consider ordering this additional test [id number]    nRBC 0.0 0.0 - 0.2 %    Comment: Performed at [facility], [place] [address] [place], [place] [address]  Type and screen [place]     Status: None (Preliminary result)   Collection Time: 05/19/19  4:53 PM  Result Value Ref Range   ABO/RH(D) O POS    Antibody Screen NEG    Sample Expiration 05/22/2019,2359    Unit Number [id number]    Blood Component Type RBC LR PHER1    Unit division 00    Status of Unit ISSUED    Transfusion Status OK TO TRANSFUSE    Crossmatch Result      Compatible Performed at [facility], [place] [address] [place], [place] [address]   Protime-INR     Status: Abnormal   Collection Time: 05/19/19  6:44 PM  Result Value Ref Range   Prothrombin Time 18.8 (H) 11.4 - 15.2 seconds   INR 1.6 (H) 0.8 - 1.2    Comment: (NOTE) INR goal varies based on device and disease states. Performed at [facility], [place] [address] [place], [place] [address]   Ethanol     Status: Abnormal   Collection Time: 05/19/19  6:45 PM  Result Value Ref Range   Alcohol, Ethyl (B) 12 (H) <10 mg/dL    Comment: (NOTE) Lowest detectable limit for serum alcohol is 10 mg/dL. For medical purposes only. Performed at [facility], [place] [address], [place], [place] [address]   POC occult blood, ED     Status: Abnormal   Collection Time: 05/19/19  6:57 PM  Result Value Ref Range   Fecal Occult Bld POSITIVE (A) NEGATIVE  SARS Coronavirus 2 by RT PCR (hospital order, performed in [facility] hospital lab) Nasopharyngeal Nasopharyngeal Swab     Status: None   Collection Time: 05/19/19  7:03 PM   Specimen: Nasopharyngeal Swab  Result Value Ref Range   SARS Coronavirus 2 NEGATIVE NEGATIVE    Comment: (NOTE) If result is NEGATIVE SARS-CoV-2 target nucleic acids are NOT DETECTED. The SARS-CoV-2 RNA is generally detectable in upper and lower  respiratory specimens during the acute phase of infection. The lowest  concentration of SARS-CoV-2 viral copies this assay can detect is 250  copies / mL. A negative result does not preclude SARS-CoV-2 infection  and should not be used as the sole basis for treatment or other  patient management decisions.  A negative result may occur with  improper specimen collection / handling, submission of specimen other  than nasopharyngeal swab, presence of viral mutation(s) within the  areas targeted by this assay, and inadequate number of viral copies  (<250 copies / mL). A negative result must be combined with clinical  observations, patient history, and epidemiological information. If result is POSITIVE SARS-CoV-2 target nucleic acids are DETECTED. The SARS-CoV-2 RNA is generally detectable in upper and lower  respiratory specimens dur ing the acute phase of infection.  Positive  results are indicative of active infection with SARS-CoV-2.  Clinical  correlation with patient history and other diagnostic information is  necessary to determine patient infection status.  Positive results do  not rule out bacterial infection or co-infection with other viruses. If result is PRESUMPTIVE POSTIVE SARS-CoV-2 nucleic acids MAY BE PRESENT.   A presumptive positive result was obtained on the submitted specimen   and confirmed on repeat testing.  While 2019 novel coronavirus  (SARS-CoV-2) nucleic acids may be present in the submitted sample  additional confirmatory testing may be necessary for epidemiological  and / or clinical management purposes  to differentiate between  SARS-CoV-2 and other Sarbecovirus currently known to infect humans.  If clinically indicated additional testing with an alternate test  methodology [phone] is advised. The SARS-CoV-2 RNA is generally  detectable in upper and lower respiratory sp ecimens during the acute  phase of infection. The expected result is Negative. Fact Sheet for Patients:  StrictlyIdeas.no Fact Sheet for Healthcare Providers: BankingDealers.co.za This test is not yet approved or cleared by the [place] FDA and has been authorized for detection and/or diagnosis of SARS-CoV-2 by FDA under an Emergency Use Authorization (EUA).  This EUA will remain in effect (meaning this test can be used) for the duration of the COVID-19 declaration under Section 564(b)(1) of the Act, 21 U.S.C. section 360bbb-3(b)(1), unless the authorization is terminated or revoked sooner. Performed at [facility], [place] [address] [place], [place] [address]   Hemoglobin     Status: Abnormal   Collection Time: 05/20/19 12:41 AM  Result Value Ref Range   Hemoglobin 8.4 (L) 13.0 - 17.0 g/dL    Comment: Performed at [facility] [address], [place], [place] [address]  Hematocrit     Status: Abnormal   Collection Time: 05/20/19 12:41 AM  Result Value Ref Range   HCT 25.5 (L) 39.0 - 52.0 %    Comment: Performed at [facility], [place] [address], [place], [place] [address]  Hepatitis panel, acute     Status: None   Collection Time: 05/20/19  5:21 AM  Result Value Ref Range   Hepatitis B Surface Ag NON REACTIVE NON REACTIVE  HCV Ab NON REACTIVE NON REACTIVE    Comment: (NOTE) Nonreactive HCV antibody  screen is consistent with no HCV infections,  unless recent infection is suspected or other evidence exists to indicate HCV infection.    Hep A IgM NON REACTIVE NON REACTIVE   Hep B C IgM NON REACTIVE NON REACTIVE    Comment: Performed at [facility], [place] [address], [place], [place] [address]  Hemoglobin     Status: Abnormal   Collection Time: 05/20/19  5:21 AM  Result Value Ref Range   Hemoglobin 7.6 (L) 13.0 - 17.0 g/dL    Comment: Performed at [facility] [address] [place], [place] [address]  Hematocrit     Status: Abnormal   Collection Time: 05/20/19  5:21 AM  Result Value Ref Range   HCT 24.1 (L) 39.0 - 52.0 %    Comment: Performed at [facility], [place] [address] [place], [place] [address]  HIV Antibody (routine testing w rflx)     Status: None   Collection Time: 05/20/19  5:21 AM  Result Value Ref Range   HIV Screen 4th Generation wRfx NON REACTIVE NON REACTIVE    Comment: Performed at [facility] [address] [place], [place] [address]  Comprehensive metabolic panel     Status: Abnormal   Collection Time: 05/20/19  5:21 AM  Result Value Ref Range   Sodium 139 135 - 145 mmol/L   Potassium 3.7 3.5 - 5.1 mmol/L   Chloride 104 98 - 111 mmol/L   CO2 27 22 - 32 mmol/L   Glucose, Bld 127 (H) 70 - 99 mg/dL   BUN 13 6 - 20 mg/dL   Creatinine, Ser 0.68 0.61 - 1.24 mg/dL   Calcium 7.8 (L) 8.9 - 10.3 mg/dL   Total Protein 5.2 (L) 6.5 - 8.1 g/dL   Albumin 2.5 (L) 3.5 - 5.0 g/dL   AST 83 (H) 15 - 41 U/L   ALT 25 0 - 44 U/L   Alkaline Phosphatase 41 38 - 126 U/L   Total Bilirubin 2.5 (H) 0.3 - 1.2 mg/dL   GFR calc non Af Amer >60 >60 mL/min   GFR calc Af Amer >60 >60 mL/min   Anion gap 8 5 - 15    Comment: Performed at [facility], [place] [address] [place], [place] [address]  Magnesium     Status: Abnormal   Collection Time: 05/20/19  5:21 AM  Result Value Ref Range   Magnesium 1.5 (L) 1.7 - 2.4 mg/dL    Comment: Performed at [facility] [address] [place], [place] [address]  Protime-INR     Status: Abnormal   Collection Time: 05/20/19  5:21 AM  Result Value Ref Range   Prothrombin Time 19.9 (H) 11.4 - 15.2 seconds   INR 1.7 (H) 0.8 - 1.2    Comment: (NOTE) INR goal varies based on device and disease states. Performed at [facility], [place] [address] [place], [place] [address]   Phosphorus     Status: None   Collection Time: 05/20/19  5:21 AM  Result Value Ref Range   Phosphorus 2.6 2.5 - 4.6 mg/dL    Comment: Performed at [facility] [address], [place], [place] [address]  Prepare RBC     Status: None   Collection Time: 05/20/19  7:00 AM  Result Value Ref Range   Order Confirmation  ORDER PROCESSED BY BLOOD BANK Performed at [facility], [place] [address] [place], [place] [address]   Prepare RBC     Status: None   Collection Time: 05/20/19  8:27 AM  Result Value Ref Range   Order Confirmation      ORDER PROCESSED BY BLOOD BANK BB SAMPLE OR UNITS ALREADY AVAILABLE Performed at [facility], [place] [address] [place], [place] [address]   CBG monitoring, ED     Status: Abnormal   Collection Time: 05/20/19  9:12 AM  Result Value Ref Range   Glucose-Capillary 118 (H) 70 - 99 mg/dL  Hepatitis B surface antigen     Status: None   Collection Time: 05/20/19 10:40 AM  Result Value Ref Range   Hepatitis B Surface Ag NON REACTIVE NON REACTIVE    Comment: Performed at [facility], [place] [address], [place], [place] [address]  Hepatitis A antibody, total     Status: None   Collection Time: 05/20/19 10:45 AM  Result Value Ref Range   hep A Total Ab NON REACTIVE NON REACTIVE    Comment: Performed at [facility] [address] [place], [place] [address]  Hepatitis B core antibody, total     Status: None   Collection Time: 05/20/19 10:45 AM  Result Value Ref Range   Hep B Core Total Ab NON REACTIVE NON REACTIVE    Comment: Performed at [facility] [address], [place], [place] [address]  Lipase, blood     Status: None   Collection Time: 05/20/19  5:10 PM  Result Value Ref Range   Lipase 22 11 - 51 U/L    Comment: Performed at [facility] [address], [place], [place] [address]  Amylase     Status: None   Collection Time: 05/20/19  5:10 PM  Result Value Ref Range   Amylase 32 28 - 100 U/L    Comment: Performed at [facility] [address] [place], [place] [address]  CBC     Status: Abnormal   Collection Time: 05/20/19  5:10 PM  Result Value Ref Range   WBC 3.8 (L) 4.0 - 10.5 K/uL   RBC 2.48 (L) 4.22 - 5.81 MIL/uL   Hemoglobin 8.5 (L) 13.0 - 17.0 g/dL   HCT 25.7 (L) 39.0 - 52.0 %   MCV 103.6 (H) 80.0 - 100.0 fL   MCH 34.3 (H) 26.0 - 34.0 pg   MCHC 33.1 30.0 - 36.0 g/dL   RDW 14.8 11.5 - 15.5 %   Platelets 34 (L) 150 - 400 K/uL    Comment: REPEATED TO VERIFY PLATELET COUNT CONFIRMED BY SMEAR SPECIMEN CHECKED FOR CLOTS Immature Platelet Fraction may be clinically indicated, consider ordering this additional test [id number]    nRBC 0.0 0.0 - 0.2 %    Comment: Performed at [facility], [place] [address], [place], [place] [address]  Comprehensive metabolic panel     Status: Abnormal   Collection Time: 05/20/19  5:10 PM  Result Value Ref Range   Sodium 139 135 - 145 mmol/L   Potassium 3.6 3.5 - 5.1 mmol/L   Chloride 108 98 - 111 mmol/L   CO2 25 22 - 32 mmol/L   Glucose, Bld 134 (H) 70 - 99 mg/dL   BUN 17 6 - 20 mg/dL   Creatinine, Ser 0.69 0.61 - 1.24 mg/dL   Calcium 7.7 (L) 8.9 - 10.3 mg/dL  Total Protein 4.9 (L) 6.5 - 8.1 g/dL   Albumin 2.5 (L) 3.5 - 5.0 g/dL   AST 81 (H) 15 - 41 U/L   ALT 25 0 - 44 U/L   Alkaline Phosphatase 39 38 - 126 U/L   Total Bilirubin 3.0 (H) 0.3 - 1.2 mg/dL   GFR calc non Af Amer >60 >60 mL/min   GFR calc Af Amer >60 >60 mL/min   Anion gap 6 5 - 15    Comment: Performed at [facility] [address] [place], [place] [address]  Vitamin B12     Status:  None   Collection Time: 05/20/19  5:10 PM  Result Value Ref Range   Vitamin B-12 346 180 - 914 pg/mL    Comment: (NOTE) This assay is not validated for testing neonatal or myeloproliferative syndrome specimens for Vitamin B12 levels. Performed at [facility], [place] [address], [place], [place] [address]   Folate     Status: Abnormal   Collection Time: 05/20/19  5:10 PM  Result Value Ref Range   Folate 5.1 (L) >5.9 ng/mL    Comment: Performed at [facility], [place] [address], [place], [place] [address]  Iron and TIBC     Status: Abnormal   Collection Time: 05/20/19  5:10 PM  Result Value Ref Range   Iron 180 45 - 182 ug/dL   TIBC 210 (L) 250 - 450 ug/dL   Saturation Ratios 86 (H) 17.9 - 39.5 %   UIBC 30 ug/dL    Comment: Performed at [facility] [address], [place], [place] [address]  Ferritin     Status: None   Collection Time: 05/20/19  5:10 PM  Result Value Ref Range   Ferritin 187 24 - 336 ng/mL    Comment: Performed at [facility], [place] [address], [place], [place] [address]  Reticulocytes     Status: Abnormal   Collection Time: 05/20/19  5:10 PM  Result Value Ref Range   Retic Ct Pct 2.7 0.4 - 3.1 %   RBC. 2.48 (L) 4.22 - 5.81 MIL/uL   Retic Count, Absolute 67.5 19.0 - 186.0 K/uL   Immature Retic Fract 13.2 2.3 - 15.9 %    Comment: Performed at [facility] [address] [place], [place] [address]   Ct Abdomen Pelvis W Wo Contrast  Result Date: 05/20/2019 CLINICAL DATA:  Acute onset of abdominal pain, hematemesis, and melena beginning in this morning. Elevated liver function tests and thrombocytopenia. EXAM: CT ABDOMEN AND PELVIS WITHOUT AND WITH CONTRAST TECHNIQUE: Multidetector CT imaging of the abdomen and pelvis was performed following the standard protocol before and following the bolus administration of intravenous contrast. CONTRAST:  116mL OMNIPAQUE IOHEXOL 300 MG/ML  SOLN COMPARISON:  None. FINDINGS: Lower Chest: No acute  findings. Hepatobiliary: Heterogeneous pattern of moderate to severe steatosis is seen throughout the liver. A few tiny sub-cm hepatic cysts are noted, but no liver masses are identified. Although there are no definite gross morphologic findings of cirrhosis, recanalization of paraumbilical veins is highly suspicious for portal venous hypertension. Gallbladder is unremarkable. No evidence of biliary ductal dilatation. Pancreas:  No mass or inflammatory changes. Spleen: Within normal limits in size and appearance. Adrenals/Urinary Tract: Several small cysts are noted in both kidneys. No masses identified. No evidence of hydronephrosis. Stomach/Bowel: No evidence of obstruction, inflammatory process or abscess. Vascular/Lymphatic: No pathologically enlarged lymph nodes. No abdominal aortic aneurysm. Mild to moderate  gastroesophageal varices are seen, consistent with portal venous hypertension. Reproductive:  No mass or other significant abnormality. Other:  Mild-to-moderate ascites. Musculoskeletal:  No suspicious bone lesions identified. IMPRESSION: Diffuse heterogeneous pattern of hepatic steatosis. No hepatic mass identified. Findings highly suspicious for portal venous hypertension, including gastroesophageal varices and ascites. Electronically Signed   By: Marlaine Hind M.D.   On: 05/20/2019 20:32   Dg Chest Port 1 View  Result Date: 05/19/2019 CLINICAL DATA:  Chest pain nausea vomiting EXAM: PORTABLE CHEST 1 VIEW COMPARISON:  None. FINDINGS: The heart size and mediastinal contours are within normal limits. Both lungs are clear. The visualized skeletal structures are unremarkable. IMPRESSION: No active disease. Electronically Signed   By: Donavan Foil M.D.   On: 05/19/2019 19:27    Pending Labs Unresulted Labs (From admission, onward)    Start     Ordered   05/20/19 1500  CBC  Every 12 hours (non-specified),   R (with TIMED occurrences)     05/20/19 1339   05/20/19 0746  HCV Ab Reflex to Quant PCR   Add-on,   AD     05/20/19 0745   05/20/19 0745  Hepatitis B surface antibody  Add-on,   AD     05/20/19 0745          Vitals/Pain Today's Vitals   05/20/19 1900 05/20/19 1920 05/20/19 2011 05/20/19 2129  BP: 113/72  110/83   Pulse: 85  88   Resp: (!) 22     Temp:      TempSrc:      SpO2: 98%     Weight:      Height:      PainSc:  5   5     Isolation Precautions No active isolations  Medications Medications  octreotide (SANDOSTATIN) 2 mcg/mL load via infusion 50 mcg (50 mcg Intravenous Bolus from Bag 05/19/19 2105)    And  octreotide (SANDOSTATIN) 500 mcg in sodium chloride 0.9 % 250 mL (2 mcg/mL) infusion (50 mcg/hr Intravenous Continued from Pre-op 05/20/19 1422)  sodium chloride flush (NS) 0.9 % injection 3 mL (3 mLs Intravenous Not Given 05/20/19 2051)  ondansetron (ZOFRAN) tablet 4 mg ( Oral MAR Unhold 05/20/19 1551)    Or  ondansetron (ZOFRAN) injection 4 mg ( Intravenous MAR Unhold 05/20/19 1551)  LORazepam (ATIVAN) tablet 1-4 mg ( Oral MAR Unhold 05/20/19 1551)    Or  LORazepam (ATIVAN) injection 1-4 mg ( Intravenous MAR Unhold 05/20/19 1551)  thiamine (VITAMIN B-1) tablet 100 mg ( Oral MAR Unhold 05/20/19 1551)    Or  thiamine (B-1) injection 100 mg ( Intravenous MAR Unhold [id number])  folic acid (FOLVITE) tablet 1 mg ( Oral MAR Unhold 05/20/19 1551)  multivitamin with minerals tablet 1 tablet ( Oral MAR Unhold 05/20/19 1551)  LORazepam (ATIVAN) injection 0-4 mg (0 mg Intravenous Not Given 05/20/19 2019)    Followed by  LORazepam (ATIVAN) injection 0-4 mg ( Intravenous MAR Unhold 05/20/19 1551)  sodium chloride 0.9 % bolus 1,000 mL (0 mLs Intravenous Stopped 05/19/19 1944)    And  0.9 %  sodium chloride infusion ( Intravenous Stopped 05/20/19 2053)  cefTRIAXone (ROCEPHIN) 1 g in sodium chloride 0.9 % 100 mL IVPB (0 g Intravenous Stopped 05/20/19 2055)  fentaNYL (SUBLIMAZE) injection 25-50 mcg ( Intravenous MAR Unhold 05/20/19 1551)  0.9 %  sodium  chloride infusion (Manually program via Guardrails IV Fluids) ( Intravenous MAR Unhold 05/20/19 1551)  nicotine (NICODERM CQ - dosed in mg/24 hours) patch 14  mg ( Transdermal MAR Unhold 05/20/19 1551)  metoCLOPramide (REGLAN) injection 10 mg ( Intravenous MAR Unhold 05/20/19 1551)  metoCLOPramide (REGLAN) injection 10 mg ( Intravenous MAR Unhold 05/20/19 1551)  pantoprazole (PROTONIX) injection 40 mg (has no administration in time range)  pantoprazole (PROTONIX) 80 mg in sodium chloride 0.9 % 100 mL IVPB (0 mg Intravenous Stopped 05/19/19 2014)  ondansetron (ZOFRAN) injection 4 mg (4 mg Intravenous Given 05/19/19 1857)  morphine 4 MG/ML injection 4 mg (4 mg Intravenous Given 05/19/19 1858)  cefTRIAXone (ROCEPHIN) 2 g in sodium chloride 0.9 % 100 mL IVPB (0 g Intravenous Stopped 05/19/19 2048)  phytonadione (VITAMIN K) 10 mg in dextrose 5 % 50 mL IVPB (0 mg Intravenous Stopped 05/20/19 0945)  iohexol (OMNIPAQUE) 300 MG/ML solution 100 mL (100 mLs Intravenous Contrast Given 05/20/19 1937)    Mobility walks Low fall risk   Focused Assessments Cardiac Assessment Handoff:    No results found for: CKTOTAL, CKMB, CKMBINDEX, TROPONINI No results found for: DDIMER Does the Patient currently have chest pain? No      R Recommendations: See Admitting Provider Note  Report given to:   Additional Notes:

## 2019-05-20 NOTE — Progress Notes (Addendum)
I have reviewed EGG findings and spoken with Dr Rush Landmark.   EGD > - No gross lesions in esophagus. - Non-bleeding grade II and large (> 5 mm) esophageal varices. - Salmon-colored mucosa suggestive of long-segment Barrett's esophagus. - 3 cm hiatal hernia. - Type 1 isolated gastric varices (IGV1, varices located in the fundus), without bleeding. - Portal hypertensive gastropathy. 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 - Normal mucosa was found in the duodenal bulb, in the first portion of the duodenum, in the second portion of the duodenum and in the third portion of the duodenum. Impression: - The patient will be observed post-procedure, until all discharge criteria are met. - Return patient to hospital ward for ongoing care. - Continue Octreotide x 72 hours for now. - Transition to IV PPI BID x 72 hours. - Ceftriaxone x 5-day course. - Obtain CTAP with IV/PO contrast Triple phase to better define liver architecture and also defice possible varices of the stomach and small bowel. - If any evidence of recurrent coffee-ground emesis/hematemesis, then please call and discuss with oncall GI service as a repeat EGD will need to be considered. I will consider possible empiric banding of the esophageal varices vs BB prophylaxis in the next 24-48 hours as we gather more information. - CLD for next 24 hours.     I have changed the orders to reflect above recommendations. Dr Rush Landmark recommends we call the on call physician, Dr Henrene Pastor if he has bleeding tonight.

## 2019-05-21 DIAGNOSIS — Z720 Tobacco use: Secondary | ICD-10-CM

## 2019-05-21 DIAGNOSIS — I864 Gastric varices: Secondary | ICD-10-CM

## 2019-05-21 DIAGNOSIS — K7030 Alcoholic cirrhosis of liver without ascites: Secondary | ICD-10-CM

## 2019-05-21 DIAGNOSIS — E538 Deficiency of other specified B group vitamins: Secondary | ICD-10-CM

## 2019-05-21 LAB — HCV AB W REFLEX TO QUANT PCR: HCV Ab: 0.1 s/co ratio (ref 0.0–0.9)

## 2019-05-21 LAB — BPAM RBC
Blood Product Expiration Date: 202011022359
ISSUE DATE / TIME: 202010290835
Unit Type and Rh: 9500

## 2019-05-21 LAB — CBC
HCT: 24.3 % — ABNORMAL LOW (ref 39.0–52.0)
HCT: 23.5 % — ABNORMAL LOW (ref 39.0–52.0)
Hemoglobin: 8 g/dL — ABNORMAL LOW (ref 13.0–17.0)
Hemoglobin: 8.1 g/dL — ABNORMAL LOW (ref 13.0–17.0)
MCH: 33.6 pg (ref 26.0–34.0)
MCH: 34.9 pg — ABNORMAL HIGH (ref 26.0–34.0)
MCHC: 32.9 g/dL (ref 30.0–36.0)
MCHC: 34.5 g/dL (ref 30.0–36.0)
MCV: 102.1 fL — ABNORMAL HIGH (ref 80.0–100.0)
MCV: 101.3 fL — ABNORMAL HIGH (ref 80.0–100.0)
Platelets: 38 10*3/uL — ABNORMAL LOW (ref 150–400)
Platelets: 44 10*3/uL — ABNORMAL LOW (ref 150–400)
RBC: 2.38 MIL/uL — ABNORMAL LOW (ref 4.22–5.81)
RBC: 2.32 MIL/uL — ABNORMAL LOW (ref 4.22–5.81)
RDW: 15 % (ref 11.5–15.5)
RDW: 14.8 % (ref 11.5–15.5)
WBC: 3.2 10*3/uL — ABNORMAL LOW (ref 4.0–10.5)
WBC: 5.2 10*3/uL (ref 4.0–10.5)
nRBC: 0 % (ref 0.0–0.2)
nRBC: 0 % (ref 0.0–0.2)

## 2019-05-21 LAB — MRSA PCR SCREENING: MRSA by PCR: NEGATIVE

## 2019-05-21 LAB — PROTIME-INR
INR: 1.5 — ABNORMAL HIGH (ref 0.8–1.2)
Prothrombin Time: 17.9 seconds — ABNORMAL HIGH (ref 11.4–15.2)

## 2019-05-21 LAB — GLUCOSE, CAPILLARY: Glucose-Capillary: 121 mg/dL — ABNORMAL HIGH (ref 70–99)

## 2019-05-21 LAB — MAGNESIUM: Magnesium: 1.7 mg/dL (ref 1.7–2.4)

## 2019-05-21 LAB — HCV INTERPRETATION

## 2019-05-21 LAB — TYPE AND SCREEN
ABO/RH(D): O POS
Antibody Screen: NEGATIVE
Unit division: 0

## 2019-05-21 LAB — HEPATITIS B SURFACE ANTIBODY, QUANTITATIVE: Hep B S AB Quant (Post): <3.1 m[IU]/mL — ABNORMAL LOW (ref >9.9)

## 2019-05-21 NOTE — Progress Notes (Signed)
PROGRESS NOTE    Stephen Miranda   [id number]  DOB: Nov 23, 1970  DOA: 05/19/2019 PCP: Mack Hook, MD   Brief Narrative:  Stephen Miranda is a 48 y.o. male with medical history significant for elevated liver enzymes, thrombocytopenia, and malignant melanoma of his back status post excision, now presenting to the emergency department with abdominal pain, hematemesis, and melena since 3 AM on 10/28. He had multiple episodes of hematemesis and dark stool. H/o heavy ETOH abuse in the past but now drinks 1 beer a day for many months.  In ED> HR 130, FOB +, LFTS elevated, Hb 10.1 and platelets 66. Started on Protonix and Octreotide.    Subjective:  Feels anxious today about his diagnosis and his bill. He has mild abdominal discomfort. He had a dark stool today. No vomiting. Tolerating clear liquids.     Assessment & Plan:   Principal Problem:   Acute upper GI bleeding - s/p EGD- finding reviewed- see report below- has extensive varices, portal hypertensive gastropathy suspected Barrets esophagus and a hiatal hernia - cont Octreotide infusion x 72 hrs - BID Protonix   Active Problems:   Elevated liver enzymes - Likely due to alcohol abuse and cirrhosis - Hepatits panel negative     Macrocytic anemia with acute blood loss anemia - HB dropped from 10 to 7.6 - he was ordered 1 U PRBC- Hb stable ~ 8- cont to check every 12 hrs -  anemia panel shows folate deficiency -cont folic acid    Pancytopenia   Coagulopathy  - Platelets 66 and INR 1.6 - 10 U Vit K given- INR remains 1.5 - also likely due to underlying liver disease    History of alcohol abuse - he stated to me that he only drinks 1 of the 12 oz cans of beer a day - he has not had withdrawal symptoms but is a little anxious today  Cigarette smoker - smokes 1/2 ppd- Nicotine patch ordered and smoking cessation advised - he has declined the patch   Time spent in minutes: 35 DVT prophylaxis: SCDs Code Status: Full code  Family Communication:  Disposition Plan: home  Consultants:   GI Procedures:  EGD > - No gross lesions in esophagus. - Non-bleeding grade II and large (> 5 mm) esophageal varices. - Salmon-colored mucosa suggestive of long-segment Barrett's esophagus. - 3 cm hiatal hernia. - Type 1 isolated gastric varices (IGV1, varices located in the fundus), without bleeding. - Portal hypertensive gastropathy. 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 - Normal mucosa was found in the duodenal bulb, in the first portion of the duodenum, in the second portion of the duodenum and in the third portion of the duodenum. Impression: - The patient will be observed post-procedure, until all discharge criteria are met. - Return patient to hospital ward for ongoing care. - Continue Octreotide x 72 hours for now. - Transition to IV PPI BID x 72 hours. - Ceftriaxone x 5-day course. - Obtain CTAP with IV/PO contrast Triple phase to better define liver architecture and also defice possible varices of the stomach and small bowel. - If any evidence of recurrent coffee-ground emesis/hematemesis, then please call and discuss with oncall GI service as a repeat EGD will need to be considered. I will consider possible empiric banding of the esophageal varices vs BB prophylaxis in the next 24-48 hours as we gather more information. - CLD for next 24 hours. Antimicrobials:  Anti-infectives (From admission, onward)   Start     Dose/Rate  Route Frequency Ordered Stop   05/20/19 2000  cefTRIAXone (ROCEPHIN) 1 g in sodium chloride 0.9 % 100 mL IVPB     1 g 200 mL/hr over 30 Minutes Intravenous Every 24 hours 05/19/19 2049     05/19/19 1945  cefTRIAXone (ROCEPHIN) 2 g in sodium chloride 0.9 % 100 mL IVPB     2 g 200 mL/hr over 30 Minutes Intravenous  Once 05/19/19 1943 05/19/19 2048       Objective: Vitals:   05/20/19 2306 05/21/19 0408 05/21/19 0800 05/21/19 1202  BP: 113/73 104/68 100/70 (!) 99/53  Pulse: 88 86  84   Resp: _0 Temp: 99.5 F (37.5 C) 98.8 F (37.1 C) 98.6 F (37 C) 98.3 F (36.8 C)  TempSrc:   Oral Oral  SpO2: 94% 95% 97% 97%  Weight: 68 kg     Height:        Intake/Output Summary (Last 24 hours) at 05/21/2019 1537 Last data filed at 05/21/2019 1300 Gross per 24 hour  Intake 1722.92 ml  Output 300 ml  Net 1422.92 ml   Filed Weights   05/19/19 1842 05/20/19 1255 05/20/19 2306  Weight: 83.9 kg 83.9 kg 68 kg    Examination: General exam: Appears comfortable  HEENT: PERRLA, oral mucosa moist, no sclera icterus or thrush Respiratory system: Clear to auscultation. Respiratory effort normal. Cardiovascular system: S1 & S2 heard,  No murmurs  Gastrointestinal system: Abdomen soft, non-tender, nondistended. Normal bowel sounds   Central nervous system: Alert and oriented. No focal neurological deficits. Extremities: No cyanosis, clubbing or edema Skin: No rashes or ulcers Psychiatry:  anxious and depressed    Data Reviewed: I have personally reviewed following labs and imaging studies  CBC: Recent Labs  Lab 05/19/19 1653 05/20/19 0041 05/20/19 0521 05/20/19 1710 05/21/19 0243  WBC 8.6  --   --  3.8* 3.2*  HGB 10.1* 8.4* 7.6* 8.5* 8.0*  HCT 31.6* 25.5* 24.1* 25.7* 24.3*  MCV 107.5*  --   --  103.6* 102.1*  PLT 66*  --   --  34* 38*   Basic Metabolic Panel: Recent Labs  Lab 05/19/19 1653 05/20/19 0521 05/20/19 1710 05/21/19 0803  NA 138 139 139  --   K 3.6 3.7 3.6  --   CL 100 104 108  --   CO2 16* 27 25  --   GLUCOSE 136* 127* 134*  --   BUN _1 --   CREATININE 0.72 0.68 0.69  --   CALCIUM 8.5* 7.8* 7.7*  --   MG  --  1.5*  --  1.7  PHOS  --  2.6  --   --    GFR: Estimated Creatinine Clearance: 109.8 mL/min (by C-G formula based on SCr of 0.69 mg/dL). Liver Function Tests: Recent Labs  Lab 05/19/19 1653 05/20/19 0521 05/20/19 1710  AST 97* 83* 81*  ALT _2 ALKPHOS 63 41 39  BILITOT 2.6* 2.5* 3.0*  PROT 6.7 5.2* 4.9*   ALBUMIN 3.1* 2.5* 2.5*   Recent Labs  Lab 05/20/19 1710  LIPASE 22  AMYLASE 32   No results for input(s): AMMONIA in the last 168 hours. Coagulation Profile: Recent Labs  Lab 05/19/19 1844 05/20/19 0521 05/21/19 0803  INR 1.6* 1.7* 1.5*   Cardiac Enzymes: No results for input(s): CKTOTAL, CKMB, CKMBINDEX, TROPONINI in the last 168 hours. BNP (last 3 results) No results for input(s): PROBNP in the last 8760 hours. HbA1C: No  results for input(s): HGBA1C in the last 72 hours. CBG: Recent Labs  Lab 05/20/19 0912 05/21/19 0742  GLUCAP 118* 121*   Lipid Profile: No results for input(s): CHOL, HDL, LDLCALC, TRIG, CHOLHDL, LDLDIRECT in the last 72 hours. Thyroid Function Tests: No results for input(s): TSH, T4TOTAL, FREET4, T3FREE, THYROIDAB in the last 72 hours. Anemia Panel: Recent Labs    05/20/19 1710  VITAMINB12 346  FOLATE 5.1*  FERRITIN 187  TIBC 210*  IRON 180  RETICCTPCT 2.7   Urine analysis: No results found for: COLORURINE, APPEARANCEUR, LABSPEC, PHURINE, GLUCOSEU, HGBUR, BILIRUBINUR, KETONESUR, PROTEINUR, UROBILINOGEN, NITRITE, LEUKOCYTESUR Sepsis Labs: _0 (procalcitonin:4,lacticidven:4) ) Recent Results (from the past 240 hour(s))  SARS Coronavirus 2 by RT PCR (hospital order, performed in [facility] hospital lab) Nasopharyngeal Nasopharyngeal Swab     Status: None   Collection Time: 05/19/19  7:03 PM   Specimen: Nasopharyngeal Swab  Result Value Ref Range Status   SARS Coronavirus 2 NEGATIVE NEGATIVE Final    Comment: (NOTE) If result is NEGATIVE SARS-CoV-2 target nucleic acids are NOT DETECTED. The SARS-CoV-2 RNA is generally detectable in upper and lower  respiratory specimens during the acute phase of infection. The lowest  concentration of SARS-CoV-2 viral copies this assay can detect is 250  copies / mL. A negative result does not preclude SARS-CoV-2 infection  and should not be used as the sole basis for treatment or other  patient  management decisions.  A negative result may occur with  improper specimen collection / handling, submission of specimen other  than nasopharyngeal swab, presence of viral mutation(s) within the  areas targeted by this assay, and inadequate number of viral copies  (<250 copies / mL). A negative result must be combined with clinical  observations, patient history, and epidemiological information. If result is POSITIVE SARS-CoV-2 target nucleic acids are DETECTED. The SARS-CoV-2 RNA is generally detectable in upper and lower  respiratory specimens dur ing the acute phase of infection.  Positive  results are indicative of active infection with SARS-CoV-2.  Clinical  correlation with patient history and other diagnostic information is  necessary to determine patient infection status.  Positive results do  not rule out bacterial infection or co-infection with other viruses. If result is PRESUMPTIVE POSTIVE SARS-CoV-2 nucleic acids MAY BE PRESENT.   A presumptive positive result was obtained on the submitted specimen  and confirmed on repeat testing.  While 2019 novel coronavirus  (SARS-CoV-2) nucleic acids may be present in the submitted sample  additional confirmatory testing may be necessary for epidemiological  and / or clinical management purposes  to differentiate between  SARS-CoV-2 and other Sarbecovirus currently known to infect humans.  If clinically indicated additional testing with an alternate test  methodology [phone]) is advised. The SARS-CoV-2 RNA is generally  detectable in upper and lower respiratory sp ecimens during the acute  phase of infection. The expected result is Negative. Fact Sheet for Patients:  StrictlyIdeas.no Fact Sheet for Healthcare Providers: BankingDealers.co.za This test is not yet approved or cleared by the [place] FDA and has been authorized for detection and/or diagnosis of SARS-CoV-2 by FDA under  an Emergency Use Authorization (EUA).  This EUA will remain in effect (meaning this test can be used) for the duration of the COVID-19 declaration under Section 564(b)(1) of the Act, 21 U.S.C. section 360bbb-3(b)(1), unless the authorization is terminated or revoked sooner. Performed at [facility], [place] [address] [place], [place] [address]   MRSA PCR Screening  Status: None   Collection Time: 05/20/19 11:16 PM   Specimen: Nasal Mucosa; Nasopharyngeal  Result Value Ref Range Status   MRSA by PCR NEGATIVE NEGATIVE Final    Comment:        The GeneXpert MRSA Assay (FDA approved for NASAL specimens only), is one component of a comprehensive MRSA colonization surveillance program. It is not intended to diagnose MRSA infection nor to guide or monitor treatment for MRSA infections. Performed at [facility], [place] [address] [place], [place] [address]          Radiology Studies: Ct Abdomen Pelvis W Wo Contrast  Result Date: 05/20/2019 CLINICAL DATA:  Acute onset of abdominal pain, hematemesis, and melena beginning in this morning. Elevated liver function tests and thrombocytopenia. EXAM: CT ABDOMEN AND PELVIS WITHOUT AND WITH CONTRAST TECHNIQUE: Multidetector CT imaging of the abdomen and pelvis was performed following the standard protocol before and following the bolus administration of intravenous contrast. CONTRAST:  165m OMNIPAQUE IOHEXOL 300 MG/ML  SOLN COMPARISON:  None. FINDINGS: Lower Chest: No acute findings. Hepatobiliary: Heterogeneous pattern of moderate to severe steatosis is seen throughout the liver. A few tiny sub-cm hepatic cysts are noted, but no liver masses are identified. Although there are no definite gross morphologic findings of cirrhosis, recanalization of paraumbilical veins is highly suspicious for portal venous hypertension. Gallbladder is unremarkable. No evidence of biliary ductal dilatation. Pancreas:  No mass or inflammatory changes.  Spleen: Within normal limits in size and appearance. Adrenals/Urinary Tract: Several small cysts are noted in both kidneys. No masses identified. No evidence of hydronephrosis. Stomach/Bowel: No evidence of obstruction, inflammatory process or abscess. Vascular/Lymphatic: No pathologically enlarged lymph nodes. No abdominal aortic aneurysm. Mild to moderate gastroesophageal varices are seen, consistent with portal venous hypertension. Reproductive:  No mass or other significant abnormality. Other:  Mild-to-moderate ascites. Musculoskeletal:  No suspicious bone lesions identified. IMPRESSION: Diffuse heterogeneous pattern of hepatic steatosis. No hepatic mass identified. Findings highly suspicious for portal venous hypertension, including gastroesophageal varices and ascites. Electronically Signed   By: JMarlaine HindM.D.   On: 05/20/2019 20:32   Dg Chest Port 1 View  Result Date: 05/19/2019 CLINICAL DATA:  Chest pain nausea vomiting EXAM: PORTABLE CHEST 1 VIEW COMPARISON:  None. FINDINGS: The heart size and mediastinal contours are within normal limits. Both lungs are clear. The visualized skeletal structures are unremarkable. IMPRESSION: No active disease. Electronically Signed   By: KDonavan FoilM.D.   On: 05/19/2019 19:27      Scheduled Meds: . sodium chloride   Intravenous Once  . chlorhexidine  15 mL Mouth Rinse BID  . folic acid  1 mg Oral Daily  . LORazepam  0-4 mg Intravenous Q4H   Followed by  . [START ON 05/22/2019] LORazepam  0-4 mg Intravenous Q8H  . mouth rinse  15 mL Mouth Rinse q12n4p  . metoCLOPramide (REGLAN) injection  10 mg Intravenous Once  . metoCLOPramide (REGLAN) injection  10 mg Intravenous Once  . multivitamin with minerals  1 tablet Oral Daily  . nicotine  14 mg Transdermal Daily  . pantoprazole (PROTONIX) IV  40 mg Intravenous Q12H  . pneumococcal 23 valent vaccine  0.5 mL Intramuscular Tomorrow-1000  . sodium chloride flush  3 mL Intravenous Q12H  . thiamine  100  mg Oral Daily   Or  . thiamine  100 mg Intravenous Daily   Continuous Infusions: . cefTRIAXone (ROCEPHIN)  IV Stopped (05/20/19 2055)  . octreotide  (SANDOSTATIN)    IV  infusion 50 mcg/hr (05/21/19 0443)     LOS: 1 day      Debbe Odea, MD [facility] Pager: [email or web address] Password [email or web address] 05/21/2019, 3:37 PM

## 2019-05-21 NOTE — Progress Notes (Signed)
Progress Note    ASSESSMENT AND PLAN:   37. 48 yo male with newly diagnosed portal HTN ( Etoh) and upper GI bleed. EGD yesterday >> Non-bleeding large esophageal, gastric and duodenal varices. No stigmata of recent bleeding so location of bleeding still unknown.  CT AP >> moderate to severe steatosis / recanalization of paraumbilical vein / gastroesophageal varices and ascites.  -No further hematemesis.  -INR 17. >> 1.5 post Vit K SQ -Continue Octreotide gtt -Continue BID PPI -Continue Rocephin -Will get diagnostic tap prior to discharge   2. ABL anemia. Hgb up less than 1 gram post 1UPRBC  3. Barrett's esophagus, long segment. Biopsies pending.  -continue PPI.     SUBJECTIVE    EGD 05/20/19 No gross lesions in esophagus. - Non-bleeding grade II and large (> 5 mm) esophageal varices. - Salmon-colored mucosa suggestive of long-segment Barrett's esophagus. - 3 cm hiatal hernia. - Type 1 isolated gastric varices (IGV1, varices located in the fundus), without bleeding. - Portal hypertensive gastropathy. 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 - Normal mucosa was found in the duodenal bulb, in the first portion of the duodenum, in the second portion of the duodenum and in the third portion of the duodenum    OBJECTIVE:     Vital signs in last 24 hours: Temp:  [98.3 F (36.8 C)-99.5 F (37.5 C)] 98.6 F (37 C) (10/30 0800) Pulse Rate:  [82-96] 86 (10/30 0408) Resp:  [10-23] 18 (10/30 0800) BP: (98-121)/(63-83) 100/70 (10/30 0800) SpO2:  [93 %-98 %] 97 % (10/30 0800) Weight:  [68 kg-83.9 kg] 68 kg (10/29 2306) Last BM Date: 05/20/19 General:   Alert, well-developed male in NAD EENT:  Normal hearing, non icteric sclera, conjunctive pink.  Heart:  Regular rate and rhythm;  No lower extremity edema   Pulm: Normal respiratory effort, lungs CTA bilaterally without wheezes or crackles. Abdomen:  Soft, nondistended, nontender.  Normal bowel sounds.          Neurologic:   Alert and  oriented x4;  grossly normal neurologically. Psych:  Pleasant, cooperative.  Normal mood and affect.   Intake/Output from previous day: 10/29 0701 - 10/30 0700 In: 2034.9 [P.O.:480; I.V.:1122.9; Blood:282; IV [id number] Out: -  Intake/Output this shift: Total I/O In: 240 [P.O.:240] Out: 300 [Urine:300]  Lab Results: Recent Labs    05/19/19 1653  05/20/19 0521 05/20/19 1710 05/21/19 0243  WBC 8.6  --   --  3.8* 3.2*  HGB 10.1*   < > 7.6* 8.5* 8.0*  HCT 31.6*   < > 24.1* 25.7* 24.3*  PLT 66*  --   --  34* 38*   < > = values in this interval not displayed.   BMET Recent Labs    05/19/19 1653 05/20/19 0521 05/20/19 1710  NA 138 139 139  K 3.6 3.7 3.6  CL 100 104 108  CO2 16* 27 25  GLUCOSE 136* 127* 134*  BUN 13 13 17   CREATININE 0.72 0.68 0.69  CALCIUM 8.5* 7.8* 7.7*   LFT Recent Labs    05/20/19 1710  PROT 4.9*  ALBUMIN 2.5*  AST 81*  ALT 25  ALKPHOS 39  BILITOT 3.0*   PT/INR Recent Labs    05/20/19 0521 05/21/19 0803  LABPROT 19.9* 17.9*  INR 1.7* 1.5*   Hepatitis Panel Recent Labs    05/20/19 0521 05/20/19 1040  HEPBSAG NON REACTIVE NON REACTIVE  HCVAB NON REACTIVE  --   HEPAIGM NON REACTIVE  --  HEPBIGM NON REACTIVE  --     Ct Abdomen Pelvis W Wo Contrast  Result Date: 05/20/2019 CLINICAL DATA:  Acute onset of abdominal pain, hematemesis, and melena beginning in this morning. Elevated liver function tests and thrombocytopenia. EXAM: CT ABDOMEN AND PELVIS WITHOUT AND WITH CONTRAST TECHNIQUE: Multidetector CT imaging of the abdomen and pelvis was performed following the standard protocol before and following the bolus administration of intravenous contrast. CONTRAST:  187mL OMNIPAQUE IOHEXOL 300 MG/ML  SOLN COMPARISON:  None. FINDINGS: Lower Chest: No acute findings. Hepatobiliary: Heterogeneous pattern of moderate to severe steatosis is seen throughout the liver. A few tiny sub-cm hepatic cysts are noted, but no liver masses  are identified. Although there are no definite gross morphologic findings of cirrhosis, recanalization of paraumbilical veins is highly suspicious for portal venous hypertension. Gallbladder is unremarkable. No evidence of biliary ductal dilatation. Pancreas:  No mass or inflammatory changes. Spleen: Within normal limits in size and appearance. Adrenals/Urinary Tract: Several small cysts are noted in both kidneys. No masses identified. No evidence of hydronephrosis. Stomach/Bowel: No evidence of obstruction, inflammatory process or abscess. Vascular/Lymphatic: No pathologically enlarged lymph nodes. No abdominal aortic aneurysm. Mild to moderate gastroesophageal varices are seen, consistent with portal venous hypertension. Reproductive:  No mass or other significant abnormality. Other:  Mild-to-moderate ascites. Musculoskeletal:  No suspicious bone lesions identified. IMPRESSION: Diffuse heterogeneous pattern of hepatic steatosis. No hepatic mass identified. Findings highly suspicious for portal venous hypertension, including gastroesophageal varices and ascites. Electronically Signed   By: Marlaine Hind M.D.   On: 05/20/2019 20:32   Dg Chest Port 1 View  Result Date: 05/19/2019 CLINICAL DATA:  Chest pain nausea vomiting EXAM: PORTABLE CHEST 1 VIEW COMPARISON:  None. FINDINGS: The heart size and mediastinal contours are within normal limits. Both lungs are clear. The visualized skeletal structures are unremarkable. IMPRESSION: No active disease. Electronically Signed   By: Donavan Foil M.D.   On: 05/19/2019 19:27    Principal Problem:   Acute upper GI bleeding Active Problems:   Elevated liver enzymes   Tobacco use   Macrocytic anemia   Thrombocytopenia (HCC)   Coagulopathy (HCC)   History of alcohol abuse   Upper GI bleed   Abnormal liver function test   Acute post-hemorrhagic anemia   Alcohol abuse   Folic acid deficiency     LOS: 1 day   Tye Savoy ,NP 05/21/2019, 9:37 AM

## 2019-05-22 DIAGNOSIS — K709 Alcoholic liver disease, unspecified: Secondary | ICD-10-CM

## 2019-05-22 DIAGNOSIS — D649 Anemia, unspecified: Secondary | ICD-10-CM

## 2019-05-22 LAB — GLUCOSE, CAPILLARY: Glucose-Capillary: 130 mg/dL — ABNORMAL HIGH (ref 70–99)

## 2019-05-22 LAB — CBC
HCT: 24.7 % — ABNORMAL LOW (ref 39.0–52.0)
Hemoglobin: 8 g/dL — ABNORMAL LOW (ref 13.0–17.0)
MCH: 33.2 pg (ref 26.0–34.0)
MCHC: 32.4 g/dL (ref 30.0–36.0)
MCV: 102.5 fL — ABNORMAL HIGH (ref 80.0–100.0)
Platelets: 48 10*3/uL — ABNORMAL LOW (ref 150–400)
RBC: 2.41 MIL/uL — ABNORMAL LOW (ref 4.22–5.81)
RDW: 14.7 % (ref 11.5–15.5)
WBC: 6.1 10*3/uL (ref 4.0–10.5)
nRBC: 0 % (ref 0.0–0.2)

## 2019-05-22 MED ORDER — PROPRANOLOL HCL 10 MG PO TABS: 10.0000 mg | ORAL_TABLET | Freq: Two times a day (BID) | ORAL | 0 refills | Status: DC

## 2019-05-22 MED ORDER — THIAMINE HCL 100 MG PO TABS: 100.0000 mg | ORAL_TABLET | Freq: Every day | ORAL | 3 refills | Status: DC

## 2019-05-22 MED ORDER — NADOLOL 20 MG PO TABS
20.0000 mg | ORAL_TABLET | Freq: Every day | ORAL | Status: DC
  Filled 2019-05-22: qty 1

## 2019-05-22 MED ORDER — SODIUM CHLORIDE 0.9 % IV BOLUS
500.0000 mL | Freq: Once | INTRAVENOUS | Status: AC
  Administered 2019-05-22: 500 mL via INTRAVENOUS

## 2019-05-22 MED ORDER — PROPRANOLOL HCL 10 MG PO TABS
10.0000 mg | ORAL_TABLET | Freq: Two times a day (BID) | ORAL | Status: DC
  Filled 2019-05-22: qty 1

## 2019-05-22 MED ORDER — PANTOPRAZOLE SODIUM 40 MG PO TBEC: 40.0000 mg | DELAYED_RELEASE_TABLET | Freq: Every day | ORAL | 3 refills | Status: DC

## 2019-05-22 MED ORDER — FOLIC ACID 1 MG PO TABS: 1.0000 mg | ORAL_TABLET | Freq: Every day | ORAL | 3 refills | Status: DC

## 2019-05-22 MED ORDER — SODIUM CHLORIDE 0.9 % IV SOLN
510.0000 mg | Freq: Once | INTRAVENOUS | Status: AC
  Administered 2019-05-22: 510 mg via INTRAVENOUS
  Filled 2019-05-22: qty 17

## 2019-05-22 MED ORDER — ADULT MULTIVITAMIN W/MINERALS CH: 1.0000 | ORAL_TABLET | Freq: Every day | ORAL | 3 refills | Status: AC

## 2019-05-22 NOTE — TOC Transition Note (Signed)
Transition of Care [facility]) - CM/SW Discharge Note   Patient Details  Name: Stephen Miranda MRN: [id number] Date of Birth: 1971-01-08  [facility] CM/SW Contact:  Carles Collet, RN Phone Number: 05/22/2019, 10:50 AM   Clinical Narrative:   Spoke w patient, he states thathe has not gone to Dr Amil Amen in a few years. He is uninsured, asked CMA to schedule follow up appointment Monday and notify patient of date time at a [facility]. Will follow for medication needs and provide MATCH if applicable. Discussed MATCH with patient.     Final next level of care: Home/Self Care Barriers to Discharge: Continued Medical Work up   Patient Goals and CMS Choice        Discharge Placement                       Discharge Plan and Services                                     Social Determinants of Health (SDOH) Interventions     Readmission Risk Interventions No flowsheet data found.

## 2019-05-22 NOTE — Progress Notes (Signed)
Notified Dr. Wynelle Cleveland that I held Edson. She said GI is managaing that and to let them know.   I notified Mansurati that bp 90/56, hr 70, held Jerome.

## 2019-05-22 NOTE — Progress Notes (Signed)
Left a VM message for Stephen Miranda about the "match" for patient's medications.

## 2019-05-22 NOTE — Progress Notes (Signed)
Dr. Wynelle Cleveland is aware of pt's vitals and that he walked 500 feet, misstepped according to his norm, did not feel dizzy or lightheaded and he feels ready to be discharged.

## 2019-05-22 NOTE — Progress Notes (Signed)
Nsg Discharge Note  Admit Date:  05/19/2019 Discharge date: 05/22/2019   Zeik Loeb to be D/C'd Home per MD order.  AVS completed.  Copy for chart, and copy for patient signed, and dated. Patient/caregiver able to verbalize understanding.  Discharge Medication: Allergies as of 05/22/2019   No Known Allergies     Medication List    TAKE these medications   folic acid 1 MG tablet Commonly known as: FOLVITE Take 1 tablet (1 mg total) by mouth daily. Start taking on: May 23, 2019   multivitamin with minerals Tabs tablet Take 1 tablet by mouth daily. Start taking on: May 23, 2019   pantoprazole 40 MG tablet Commonly known as: Protonix Take 1 tablet (40 mg total) by mouth daily.   propranolol 10 MG tablet Commonly known as: INDERAL Take 1 tablet (10 mg total) by mouth 2 (two) times daily. Hold if HR < 60 or SBP < 85   thiamine 100 MG tablet Take 1 tablet (100 mg total) by mouth daily. Start taking on: May 23, 2019       Discharge Assessment: Vitals:   05/22/19 1600 05/22/19 1656  BP: (!) 89/61 99/66  Pulse:  73  Resp:    Temp: 98.2 F (36.8 C) 97.6 F (36.4 C)  SpO2: 96% 100%   Skin clean, dry and intact without evidence of skin break down, no evidence of skin tears noted. IV catheter discontinued intact. Site without signs and symptoms of complications - no redness or edema noted at insertion site, patient denies c/o pain - only slight tenderness at site.  Dressing with slight pressure applied.  D/c Instructions-Education: Discharge instructions given to patient with verbalized understanding. D/c education completed with patient including follow up instructions, medication list, d/c activities limitations if indicated, with other d/c instructions as indicated by MD - patient able to verbalize understanding, all questions fully answered. Patient instructed to return to ED, call 911, or call MD for any changes in condition.  Patient escorted via [place], and  D/C home via private auto.  Hassan Rowan, RN 05/22/2019 5:55 PM

## 2019-05-22 NOTE — Discharge Summary (Signed)
Physician Discharge Summary  Stephen Miranda UUE:[id number] DOB: September 10, 1970 DOA: 05/19/2019  PCP: Mack Hook, MD  Admit date: 05/19/2019 Discharge date: 05/22/2019  Admitted From:home  Disposition:  home   Recommendations for Outpatient Follow-up:  1. Biopsies will need to be followed 2. F/u Hb in 4-6 wks  Discharge Condition:  stable   CODE STATUS:  Full code  Consultations:  GI   Discharge Diagnoses:  Principal Problem:   Acute upper GI bleeding Active Problems:   History of alcohol abuse    Acute post-hemorrhagic anemia & Macrocytic anemia   Elevated liver enzymes   Cirrhosis   Tobacco use   Thrombocytopenia (HCC)   Coagulopathy (HCC)   Folic acid deficiency     Brief Summary: Stephen Miranda is a 48 y.o.malewith medical history significant forelevated liver enzymes, thrombocytopenia, and malignant melanoma of his back status post excision, now presenting to the emergency department with abdominal pain, hematemesis, and melena since 3 AM on 10/28. He had multiple episodes of hematemesis and dark stool. H/o heavy ETOH abuse in the past but now drinks 1 beer a day for many months.  In ED> HR 130, FOB +, LFTS elevated, Hb 10.1 and platelets 66. Started on Protonix and Octreotide.   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 and a hiatal hernia - treated with Octreotide infusion x 72 hrs and BID Protonix - recommended by GI to take Propranolol 10 mg BID with holding parameters for BP and HR (low BP) and daily Protinix   Active Problems:      Macrocytic anemia with acute blood loss anemia - HB dropped from 10 to 7.6 on 10/29 in setting of ongoing GI bleed - he was ordered 1 U PRBC - Hb stable ~ 8 subsequenlty -  anemia panel shows folate deficiency -  folic acid started during this admission      Elevated liver enzymes - Likely due to alcohol abuse and cirrhosis - Hepatits panel negative  Pancytopenia   Coagulopathy  - Platelets 66 and INR 1.6- likely due to underlying liver disease - 10 U Vit K given- INR remains 1.5    History of alcohol abuse - he stated to me that he only drinks 1 of the 12 oz cans of beer a day - he has not had withdrawal symptoms    Cigarette smoker - smokes 1/2 ppd- Nicotine patch ordered and smoking cessation advised - he has declined the patch     Discharge Exam: Vitals:   05/22/19 1200 05/22/19 1300  BP: (!) 90/56 (!) 85/55  Pulse: 70   Resp:    Temp: 98.6 F (37 C)   SpO2: 96% 97%   Vitals:   05/22/19 0412 05/22/19 1000 05/22/19 1200 05/22/19 1300  BP: 92/62  (!) 90/56 (!) 85/55  Pulse: 80 70 70   Resp: 18     Temp: 98.8 F (37.1 C)  98.6 F (37 C)   TempSrc: Oral  Oral   SpO2: 96%  96% 97%  Weight:      Height:        General: Pt is alert, awake, not in acute distress Cardiovascular: RRR, S1/S2 +, no rubs, no gallops Respiratory: CTA bilaterally, no wheezing, no rhonchi Abdominal: Soft, NT, ND, bowel sounds + Extremities: no edema, no cyanosis   Discharge Instructions  Discharge Instructions    Diet general  Complete by: As directed    Increase activity slowly   Complete by: As directed      Allergies as of 05/22/2019   No Known Allergies     Medication List    TAKE these medications   folic acid 1 MG tablet Commonly known as: FOLVITE Take 1 tablet (1 mg total) by mouth daily. Start taking on: May 23, 2019   multivitamin with minerals Tabs tablet Take 1 tablet by mouth daily. Start taking on: May 23, 2019   pantoprazole 40 MG tablet Commonly known as: Protonix Take 1 tablet (40 mg total) by mouth daily.   propranolol 10 MG tablet Commonly known as: INDERAL Take 1 tablet (10 mg total) by mouth 2 (two) times daily. Hold if HR < 60 or SBP < 85   thiamine 100 MG tablet Take 1 tablet (100  mg total) by mouth daily. Start taking on: May 23, 2019       No Known Allergies   Procedures/Studies: Procedures:  EGD >- No gross lesions in esophagus. - Non-bleeding grade II and large (> 5 mm) esophageal varices. - Salmon-colored mucosa suggestive of long-segment Barrett's esophagus. - 3 cm hiatal hernia. - Type 1 isolated gastric varices (IGV1, varices located in the fundus), without bleeding. - Portal hypertensive gastropathy. 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 - Normal mucosa was found in the duodenal bulb, in the first portion of the duodenum, in the second portion of the duodenum and in the third portion of the duodenum. Impression: - The patient will be observed post-procedure, until all discharge criteria are met. - Return patient to hospital ward for ongoing care. - Continue Octreotide x 72 hours for now. - Transition to IV PPI BID x 72 hours. - Ceftriaxone x 5-day course. - Obtain CTAP with IV/PO contrast Triple phase to better define liver architecture and also defice possible varices of the stomach and small bowel. - If any evidence of recurrent coffee-ground emesis/hematemesis, then please call and discuss with oncall GI service as a repeat EGD will need to be considered. I will consider possible empiric banding of the esophageal varices vs BB prophylaxis in the next 24-48 hours as we gather more information. - CLD for next 24 hours.  Ct Abdomen Pelvis W Wo Contrast  Result Date: 05/20/2019 CLINICAL DATA:  Acute onset of abdominal pain, hematemesis, and melena beginning in this morning. Elevated liver function tests and thrombocytopenia. EXAM: CT ABDOMEN AND PELVIS WITHOUT AND WITH CONTRAST TECHNIQUE: Multidetector CT imaging of the abdomen and pelvis was performed following the standard protocol before and following the bolus administration of intravenous contrast. CONTRAST:  165m OMNIPAQUE IOHEXOL 300 MG/ML  SOLN COMPARISON:  None. FINDINGS: Lower Chest:  No acute findings. Hepatobiliary: Heterogeneous pattern of moderate to severe steatosis is seen throughout the liver. A few tiny sub-cm hepatic cysts are noted, but no liver masses are identified. Although there are no definite gross morphologic findings of cirrhosis, recanalization of paraumbilical veins is highly suspicious for portal venous hypertension. Gallbladder is unremarkable. No evidence of biliary ductal dilatation. Pancreas:  No mass or inflammatory changes. Spleen: Within normal limits in size and appearance. Adrenals/Urinary Tract: Several small cysts are noted in both kidneys. No masses identified. No evidence of hydronephrosis. Stomach/Bowel: No evidence of obstruction, inflammatory process or abscess. Vascular/Lymphatic: No pathologically enlarged lymph nodes. No abdominal aortic aneurysm. Mild to moderate gastroesophageal varices are seen, consistent with portal venous hypertension. Reproductive:  No mass or other significant abnormality. Other:  Mild-to-moderate ascites.  Musculoskeletal:  No suspicious bone lesions identified. IMPRESSION: Diffuse heterogeneous pattern of hepatic steatosis. No hepatic mass identified. Findings highly suspicious for portal venous hypertension, including gastroesophageal varices and ascites. Electronically Signed   By: Marlaine Hind M.D.   On: 05/20/2019 20:32   Dg Chest Port 1 View  Result Date: 05/19/2019 CLINICAL DATA:  Chest pain nausea vomiting EXAM: PORTABLE CHEST 1 VIEW COMPARISON:  None. FINDINGS: The heart size and mediastinal contours are within normal limits. Both lungs are clear. The visualized skeletal structures are unremarkable. IMPRESSION: No active disease. Electronically Signed   By: Donavan Foil M.D.   On: 05/19/2019 19:27     The results of significant diagnostics from this hospitalization (including imaging, microbiology, ancillary and laboratory) are listed below for reference.     Microbiology: Recent Results (from the past 240  hour(s))  SARS Coronavirus 2 by RT PCR (hospital order, performed in [facility] hospital lab) Nasopharyngeal Nasopharyngeal Swab     Status: None   Collection Time: 05/19/19  7:03 PM   Specimen: Nasopharyngeal Swab  Result Value Ref Range Status   SARS Coronavirus 2 NEGATIVE NEGATIVE Final    Comment: (NOTE) If result is NEGATIVE SARS-CoV-2 target nucleic acids are NOT DETECTED. The SARS-CoV-2 RNA is generally detectable in upper and lower  respiratory specimens during the acute phase of infection. The lowest  concentration of SARS-CoV-2 viral copies this assay can detect is 250  copies / mL. A negative result does not preclude SARS-CoV-2 infection  and should not be used as the sole basis for treatment or other  patient management decisions.  A negative result may occur with  improper specimen collection / handling, submission of specimen other  than nasopharyngeal swab, presence of viral mutation(s) within the  areas targeted by this assay, and inadequate number of viral copies  (<250 copies / mL). A negative result must be combined with clinical  observations, patient history, and epidemiological information. If result is POSITIVE SARS-CoV-2 target nucleic acids are DETECTED. The SARS-CoV-2 RNA is generally detectable in upper and lower  respiratory specimens dur ing the acute phase of infection.  Positive  results are indicative of active infection with SARS-CoV-2.  Clinical  correlation with patient history and other diagnostic information is  necessary to determine patient infection status.  Positive results do  not rule out bacterial infection or co-infection with other viruses. If result is PRESUMPTIVE POSTIVE SARS-CoV-2 nucleic acids MAY BE PRESENT.   A presumptive positive result was obtained on the submitted specimen  and confirmed on repeat testing.  While 2019 novel coronavirus  (SARS-CoV-2) nucleic acids may be present in the submitted sample  additional confirmatory  testing may be necessary for epidemiological  and / or clinical management purposes  to differentiate between  SARS-CoV-2 and other Sarbecovirus currently known to infect humans.  If clinically indicated additional testing with an alternate test  methodology [id number]) is advised. The SARS-CoV-2 RNA is generally  detectable in upper and lower respiratory sp ecimens during the acute  phase of infection. The expected result is Negative. Fact Sheet for Patients:  StrictlyIdeas.no Fact Sheet for Healthcare Providers: BankingDealers.co.za This test is not yet approved or cleared by the [place] FDA and has been authorized for detection and/or diagnosis of SARS-CoV-2 by FDA under an Emergency Use Authorization (EUA).  This EUA will remain in effect (meaning this test can be used) for the duration of the COVID-19 declaration under Section 564(b)(1) of the Act, 21 U.S.C. section 360bbb-3(b)(1), unless  the authorization is terminated or revoked sooner. Performed at [facility], [place] [address] [place], [place] [address]   MRSA PCR Screening     Status: None   Collection Time: 05/20/19 11:16 PM   Specimen: Nasal Mucosa; Nasopharyngeal  Result Value Ref Range Status   MRSA by PCR NEGATIVE NEGATIVE Final    Comment:        The GeneXpert MRSA Assay (FDA approved for NASAL specimens only), is one component of a comprehensive MRSA colonization surveillance program. It is not intended to diagnose MRSA infection nor to guide or monitor treatment for MRSA infections. Performed at [facility], [place] [address], [place], [place] [address]      Labs: BNP (last 3 results) No results for input(s): BNP in the last 8760 hours. Basic Metabolic Panel: Recent Labs  Lab 05/19/19 1653 05/20/19 0521 05/20/19 1710 05/21/19 0803  NA 138 139 139  --   K 3.6 3.7 3.6  --   CL 100 104 108  --   CO2 16* 27 25  --   GLUCOSE 136* 127* 134*   --   BUN '13 13 17  ' --   CREATININE 0.72 0.68 0.69  --   CALCIUM 8.5* 7.8* 7.7*  --   MG  --  1.5*  --  1.7  PHOS  --  2.6  --   --    Liver Function Tests: Recent Labs  Lab 05/19/19 1653 05/20/19 0521 05/20/19 1710  AST 97* 83* 81*  ALT '29 25 25  ' ALKPHOS 63 41 39  BILITOT 2.6* 2.5* 3.0*  PROT 6.7 5.2* 4.9*  ALBUMIN 3.1* 2.5* 2.5*   Recent Labs  Lab 05/20/19 1710  LIPASE 22  AMYLASE 32   No results for input(s): AMMONIA in the last 168 hours. CBC: Recent Labs  Lab 05/19/19 1653  05/20/19 0521 05/20/19 1710 05/21/19 0243 05/21/19 1513 05/22/19 0231  WBC 8.6  --   --  3.8* 3.2* 5.2 6.1  HGB 10.1*   < > 7.6* 8.5* 8.0* 8.1* 8.0*  HCT 31.6*   < > 24.1* 25.7* 24.3* 23.5* 24.7*  MCV 107.5*  --   --  103.6* 102.1* 101.3* 102.5*  PLT 66*  --   --  34* 38* 44* 48*   < > = values in this interval not displayed.   Cardiac Enzymes: No results for input(s): CKTOTAL, CKMB, CKMBINDEX, TROPONINI in the last 168 hours. BNP: Invalid input(s): POCBNP CBG: Recent Labs  Lab 05/20/19 0912 05/21/19 0742 05/22/19 0835  GLUCAP 118* 121* 130*   D-Dimer No results for input(s): DDIMER in the last 72 hours. Hgb A1c No results for input(s): HGBA1C in the last 72 hours. Lipid Profile No results for input(s): CHOL, HDL, LDLCALC, TRIG, CHOLHDL, LDLDIRECT in the last 72 hours. Thyroid function studies No results for input(s): TSH, T4TOTAL, T3FREE, THYROIDAB in the last 72 hours.  Invalid input(s): FREET3 Anemia work up Recent Labs    05/20/19 1710  VITAMINB12 346  FOLATE 5.1*  FERRITIN 187  TIBC 210*  IRON 180  RETICCTPCT 2.7   Urinalysis No results found for: COLORURINE, APPEARANCEUR, LABSPEC, PHURINE, GLUCOSEU, HGBUR, BILIRUBINUR, KETONESUR, PROTEINUR, UROBILINOGEN, NITRITE, LEUKOCYTESUR Sepsis Labs Invalid input(s): PROCALCITONIN,  WBC,  LACTICIDVEN Microbiology Recent Results (from the past 240 hour(s))  SARS Coronavirus 2 by RT PCR (hospital order, performed in  [facility] hospital lab) Nasopharyngeal Nasopharyngeal Swab     Status: None   Collection Time: 05/19/19  7:03 PM  Specimen: Nasopharyngeal Swab  Result Value Ref Range Status   SARS Coronavirus 2 NEGATIVE NEGATIVE Final    Comment: (NOTE) If result is NEGATIVE SARS-CoV-2 target nucleic acids are NOT DETECTED. The SARS-CoV-2 RNA is generally detectable in upper and lower  respiratory specimens during the acute phase of infection. The lowest  concentration of SARS-CoV-2 viral copies this assay can detect is 250  copies / mL. A negative result does not preclude SARS-CoV-2 infection  and should not be used as the sole basis for treatment or other  patient management decisions.  A negative result may occur with  improper specimen collection / handling, submission of specimen other  than nasopharyngeal swab, presence of viral mutation(s) within the  areas targeted by this assay, and inadequate number of viral copies  (<250 copies / mL). A negative result must be combined with clinical  observations, patient history, and epidemiological information. If result is POSITIVE SARS-CoV-2 target nucleic acids are DETECTED. The SARS-CoV-2 RNA is generally detectable in upper and lower  respiratory specimens dur ing the acute phase of infection.  Positive  results are indicative of active infection with SARS-CoV-2.  Clinical  correlation with patient history and other diagnostic information is  necessary to determine patient infection status.  Positive results do  not rule out bacterial infection or co-infection with other viruses. If result is PRESUMPTIVE POSTIVE SARS-CoV-2 nucleic acids MAY BE PRESENT.   A presumptive positive result was obtained on the submitted specimen  and confirmed on repeat testing.  While 2019 novel coronavirus  (SARS-CoV-2) nucleic acids may be present in the submitted sample  additional confirmatory testing may be necessary for epidemiological  and / or clinical  management purposes  to differentiate between  SARS-CoV-2 and other Sarbecovirus currently known to infect humans.  If clinically indicated additional testing with an alternate test  methodology [phone]) is advised. The SARS-CoV-2 RNA is generally  detectable in upper and lower respiratory sp ecimens during the acute  phase of infection. The expected result is Negative. Fact Sheet for Patients:  StrictlyIdeas.no Fact Sheet for Healthcare Providers: BankingDealers.co.za This test is not yet approved or cleared by the [place] FDA and has been authorized for detection and/or diagnosis of SARS-CoV-2 by FDA under an Emergency Use Authorization (EUA).  This EUA will remain in effect (meaning this test can be used) for the duration of the COVID-19 declaration under Section 564(b)(1) of the Act, 21 U.S.C. section 360bbb-3(b)(1), unless the authorization is terminated or revoked sooner. Performed at [facility], [place] [address] [place], [place] [address]   MRSA PCR Screening     Status: None   Collection Time: 05/20/19 11:16 PM   Specimen: Nasal Mucosa; Nasopharyngeal  Result Value Ref Range Status   MRSA by PCR NEGATIVE NEGATIVE Final    Comment:        The GeneXpert MRSA Assay (FDA approved for NASAL specimens only), is one component of a comprehensive MRSA colonization surveillance program. It is not intended to diagnose MRSA infection nor to guide or monitor treatment for MRSA infections. Performed at [facility], [place] [address], [place], [place] [address]      Time coordinating discharge in minutes: 30  SIGNED:   Debbe Odea, MD  [facility] 05/22/2019, 4:35 PM Pager   If 7PM-7AM, please contact night-coverage [email or web address] Password [email or web address]

## 2019-05-22 NOTE — Progress Notes (Signed)
Text paged dr. Darrell Jewel and let him know about bp & hr.  He said it's fine to hold Nadolol.

## 2019-05-22 NOTE — Discharge Instructions (Signed)
Do not take you Propranolol if your heart rate is < 60 or you systolic BP is < 85  You were cared for by a hospitalist during your hospital stay. If you have any questions about your discharge medications or the care you received while you were in the hospital after you are discharged, you can call the unit and asked to speak with the hospitalist on call if the hospitalist that took care of you is not available. Once you are discharged, your primary care physician will handle any further medical issues.   Please note that NO REFILLS for any discharge medications will be authorized once you are discharged, as it is imperative that you return to your primary care physician (or establish a relationship with a primary care physician if you do not have one) for your aftercare needs so that they can reassess your need for medications and monitor your lab values.  Please take all your medications with you for your next visit with your Primary MD. Please ask your Primary MD to get all Hospital records sent to his/her office. Please request your Primary MD to go over all hospital test results at the follow up.   If you experience worsening of your admission symptoms, develop shortness of breath, chest pain, suicidal or homicidal thoughts or a life threatening emergency, you must seek medical attention immediately by calling 911 or calling your MD.   Dennis Bast must read the complete instructions/literature along with all the possible adverse reactions/side effects for all the medicines you take including new medications that have been prescribed to you. Take new medicines after you have completely understood and accpet all the possible adverse reactions/side effects.    Do not drive when taking pain medications or sedatives.     Do not take more than prescribed Pain, Sleep and Anxiety Medications   If you have smoked or chewed Tobacco in the last 2 yrs please stop. Stop any regular alcohol  and or recreational  drug use.   Wear Seat belts while driving.

## 2019-05-22 NOTE — Progress Notes (Signed)
Gastroenterology Inpatient Follow-up Note   PATIENT IDENTIFICATION  Stephen Miranda is a 48 y.o. 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 Hospital Day: 4  SUBJECTIVE  Patient overall has been feeling well. His blood pressures today did not allow adequate titration of his nadolol that I had ordered. He is very concerned about his ability to return to medical care and cannot stay in the hospital due to rising medical cost that he knows he will not be able to pay. He remains on octreotide and PPI at this time.   OBJECTIVE   Physical Examination  Temp:  [97.6 F (36.4 C)-98.8 F (37.1 C)] 97.6 F (36.4 C) (10/31 1656) Pulse Rate:  [70-80] 73 (10/31 1656) Resp:  [18] 18 (10/31 0412) BP: (85-99)/(55-66) 99/66 (10/31 1656) SpO2:  [96 %-100 %] 100 % (10/31 1656) Weight:  [86.1 kg] 86.1 kg (10/31 0407) Temp (24hrs), Avg:98.3 F (36.8 C), Min:97.6 F (36.4 C), Max:98.8 F (37.1 C)  Weight: 86.1 kg GEN: NAD, resting in bed PSYCH: Cooperative, without pressured speech EYE: Icteric sclerae ENT: MMM CV: Nontachycardic without rubs/gallops RESP: Decreased breath sounds at the bases bilaterally but no adventitious breath sounds present GI: NABS, soft, minimally tender to the midepigastrium, ND, without rebound or guarding MSK/EXT: No lower extremity edema SKIN: Jaundiced NEURO:  Alert & Oriented x 3, no focal deficits, no evidence of asterixis    Review of Data   Laboratory Studies   Recent Labs  Lab 05/20/19 0521 05/20/19 1710 05/21/19 0803  NA 139 139  --   K 3.7 3.6  --   CL 104 108  --   CO2 27 25  --   BUN 13 17  --   CREATININE 0.68 0.69  --   GLUCOSE 127* 134*  --   CALCIUM 7.8* 7.7*  --   MG 1.5*  --  1.7  PHOS 2.6  --   --    Recent Labs  Lab 05/20/19 1710  AST 81*  ALT 25   ALKPHOS 39    Recent Labs  Lab 05/21/19 0243 05/21/19 1513 05/22/19 0231  WBC 3.2* 5.2 6.1  HGB 8.0* 8.1* 8.0*  HCT 24.3* 23.5* 24.7*  PLT 38* 44* 48*   Recent Labs  Lab 05/19/19 1844 05/20/19 0521 05/21/19 0803  INR 1.6* 1.7* 1.5*   MELD-Na score: 15 at 05/21/2019  8:03 AM MELD score: 15 at 05/21/2019  8:03 AM Calculated from: Serum Creatinine: 0.69 mg/dL (Rounded to 1 mg/dL) at 05/20/2019  5:10 PM Serum Sodium: 139 mmol/L (Rounded to 137 mmol/L) at 05/20/2019  5:10 PM Total Bilirubin: 3.0 mg/dL at 05/20/2019  5:10 PM INR(ratio): 1.5 at 05/21/2019  8:03 AM Age: 62 years 8 months  Imaging Studies  No new imaging studies to review   ASSESSMENT  [name] is a 48 y.o. 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  A difficult situation overall.  Thankfully he has been hemodynamically stable throughout this course of time.  No clear evidence source for his reported coffee-ground emesis and anemia although he has been hemodynamically stable.  He was not able to tolerate nadolol long-acting as a result of his heart rate well has been resting in bed and while on octreotide.  He cannot stay in the hospital any longer.  He does not want to undergo endoscopic evaluation currently.  He understands the potential risk that if we do not put him on a beta-blocker he does not have potential incomplete variceal ligation that he may have persistent issues and may return to the hospital with bleeding.  He understands and accepts this risk.  He understands it is life-threatening but he cannot stay in the hospital any longer.  I have talked with him and have decided that we will trial of the shorter acting propranolol and smaller dosing.  He has been given very thorough education to not take the medication if his  heart rates are under 60 or if his blood pressure is less than 80/50.  He is going to be initiated on propranolol 10 mg twice daily.  We are going to work with him to try and follow-up with [place] in clinic.  He will need some labs.  He previously had the orange card but is no longer able to have access to that.  He is in the process of going to start to work on trying to get and see Medicaid.  The patient understands that if at any time point he has recurrent coffee-ground emesis/hematemesis/black and tarry stools that he should immediately come to the hospital.  At that point he likely will undergo variceal band ligation even if he cannot return to the hospital.  Hopefully with his cessation from alcohol that portal hypertension will begin to decrease and with the propranolol if he can tolerate it we will not have any further episodes of unclear upper GI bleeding.  I am going to work to try and have the patient follow-up with [place] in clinic.  A difficult situation overall and one that is not ideal but I would rather him be on this rather than have nothing done and hopefully he will improve his lifestyle and hopefully we can have some liver regeneration from a presumed liver cirrhosis as we know that alcoholic liver disease can have some regression of portal hypertension once alcohol is subsided.   PLAN/RECOMMENDATIONS  May stop octreotide Stop nadolol Initiate propranolol 10 mg twice daily -Do not take if SBP less than 80/50 or heart rate less than 60 Continue omeprazole 40 mg daily Alcohol cessation a must Repeat LFTs in 2 to 3 weeks 1.5 g/kg/day of Protein by taking more protein shakes as able We will try to arrange follow-up for him if he chooses to Patient knows that if he has any evidence of coffee-ground emesis/hematemesis/black and tarry stools he needs to immediately come back to the hospital Not ideal for discharge, but better than the patient leaving [place].     Please  page/call with questions or concerns.   Justice Britain, MD [facility] # [phone]

## 2019-05-24 ENCOUNTER — Telehealth: Payer: Self-pay

## 2019-05-24 NOTE — Telephone Encounter (Signed)
-----  Message from Irving Copas., MD sent at 05/23/2019  3:10 AM EST ----- Regarding: Follow-up Stephen Miranda, This is a new patient that we met during the hospital.This patient needs to have a set of labs and follow-up in clinic.He is uninsured so it may be difficult but hopefully he will come back for labs and potential follow-up.In 2 to 3 weeks please have him return and have a CMP and INR and CBC and iron/TIBC and ferritin. He should be set up for follow-up in clinic in 3-4 weeks with Stephen Miranda or myself. Even if he does not show up for his first appointment I would like him to have propranolol refills for at least 2 months she may send that to the pharmacy.After that he needs to follow-up with [place] otherwise but cannot continue his care.Thanks.GM

## 2019-05-24 NOTE — Telephone Encounter (Signed)
Left message on machine to call back  

## 2019-05-25 NOTE — Telephone Encounter (Signed)
Left message on machine to call back letter mailed to the pt

## 2019-06-09 ENCOUNTER — Inpatient Hospital Stay: Payer: Self-pay | Admitting: Critical Care Medicine

## 2019-06-25 ENCOUNTER — Other Ambulatory Visit: Payer: Self-pay

## 2019-06-25 ENCOUNTER — Ambulatory Visit: Payer: Self-pay | Admitting: Internal Medicine

## 2019-06-25 ENCOUNTER — Encounter: Payer: Self-pay | Admitting: Internal Medicine

## 2019-06-25 VITALS — BP 100/70 | HR 84 | Resp 12 | Ht 73.0 in | Wt 198.0 lb

## 2019-06-25 DIAGNOSIS — Z720 Tobacco use: Secondary | ICD-10-CM

## 2019-06-25 DIAGNOSIS — D649 Anemia, unspecified: Secondary | ICD-10-CM

## 2019-06-25 DIAGNOSIS — Z23 Encounter for immunization: Secondary | ICD-10-CM

## 2019-06-25 DIAGNOSIS — K7031 Alcoholic cirrhosis of liver with ascites: Secondary | ICD-10-CM

## 2019-06-25 DIAGNOSIS — R609 Edema, unspecified: Secondary | ICD-10-CM

## 2019-06-25 DIAGNOSIS — Z716 Tobacco abuse counseling: Secondary | ICD-10-CM

## 2019-06-25 MED ORDER — SPIRONOLACTONE 100 MG PO TABS: 100.0000 mg | ORAL_TABLET | Freq: Every day | ORAL | 11 refills | Status: DC

## 2019-06-25 MED ORDER — PANTOPRAZOLE SODIUM 40 MG PO TBEC: 40.0000 mg | DELAYED_RELEASE_TABLET | Freq: Every day | ORAL | 3 refills | Status: DC

## 2019-06-25 MED ORDER — FUROSEMIDE 40 MG PO TABS: 40.0000 mg | ORAL_TABLET | Freq: Every day | ORAL | 11 refills | Status: DC

## 2019-06-25 MED ORDER — PROPRANOLOL HCL 10 MG PO TABS: 10.0000 mg | ORAL_TABLET | Freq: Two times a day (BID) | ORAL | 3 refills | Status: DC

## 2019-06-25 MED ORDER — FERROUS GLUCONATE 324 (38 FE) MG PO TABS: ORAL_TABLET | ORAL | 3 refills | Status: DC

## 2019-06-25 NOTE — Patient Instructions (Signed)
Limit sodium intake to 2000 mg or 2 g daily Check nutrition labels Call [place] [facility] to see about getting Twinrix for Hep A and Hep B immunization.  You are not immune to either.  Take this with you and show them Apply for [place] for the orange card No alcohol whatsoever.

## 2019-06-25 NOTE — Progress Notes (Signed)
Subjective:    Patient ID: Stephen Miranda, male   DOB: 05/11/1971, 48 y.o.   MRN: [id number]   HPI   Reestablishing after being out of contact for over 2 years.  See hospitalization for Upper GI bleed and findings of alcoholic cirrhosis, including extensive varices, portal hypertensive gastropathy, Barrets esophagus, macrocytic anemia, thrombocytopenia, coagulopathy, folate deficiency from 05/19/2019. Hgb dropped to 7.6 and transfused 1 PRBC with resulting 8 hgb. Treated with Octreotide infusion x 72 hours and Protonix BID. Propranolol 10 mg BID initiated, to hold for low BP and pulse. Viral hepatitis panel negative. Taking MV, Thiamine, and Folic Acid as well. EGD performed by Dr. Rush Landmark of [facility] on 05/20/2019.  No definitive source of UGI bleed found.   Mid esophagus with large varices, Gastric varices noted. Mucosal changes to suggest Barrett's noted in distal esophagus. Moderate protal hypertensive gastropathy in entire stomach Large duodenal varix noted. No procedural treatment of varices performed as patient stabilized  Current complaints:  Having a lot of abdominal pressure of entire abdomen and back pain.  Just describes not being "like a tree trunk" with his abdomen distended--like he cannot bend. He gets pain in his mid thorax to coccygeal area, but unable to bend forward to stretch and relieve due to abdominal distention. States his abdominal distention started about 3 weeks after discharge from his hospitalization.  He initially had significant pitting edema of legs as well, but that is gradually receding.   He is not limiting sodium in his diet. States he has not had any alcohol now since October 26th. Has a history of minimizing alcohol intake. Continues to smoke 5 cigs daily. Previous Viral hepatitis profiles show he is not immune to Hep A or B.  Negative to Hep C as well.    Drinks a fruit and vegetable shake in the morning. Canned soup or deli meat  sandwich midday.  Water or apple juice or gingerale, but generally water Frozen pizza for dinner or canned soup or pasta/rice dish.    Problem with ankles and toes:  Had problems flexing toes before he was hospitalized and before edema started.  Improving but as edema resolves, his toes are starting to hurt a bit   Current Meds  Medication Sig  . folic acid (FOLVITE) 1 MG tablet Take 1 tablet (1 mg total) by mouth daily.  . Multiple Vitamin (MULTIVITAMIN WITH MINERALS) TABS tablet Take 1 tablet by mouth daily.  . pantoprazole (PROTONIX) 40 MG tablet Take 1 tablet (40 mg total) by mouth daily.  . propranolol (INDERAL) 10 MG tablet Take 1 tablet (10 mg total) by mouth 2 (two) times daily. Hold if HR < 60 or SBP < 85  . thiamine 100 MG tablet Take 1 tablet (100 mg total) by mouth daily.   No Known Allergies   Review of Systems    Objective:   BP 100/70 (BP Location: Left Arm, Patient Position: Sitting, Cuff Size: Normal)   Pulse 84   Resp 12   Ht 6\' 1"  (1.854 m)   Wt 198 lb (89.8 kg)   BMI 26.12 kg/m   Physical Exam  Chronically ill appearing, very pale HEENT:  Palpebral conjunctivae quite pale, PERRL, EOMI Neck:  Supple, No adenopathy Chest:  CTA CV:  RRR with normal S1 and S2, No S3, S4, or murmur.  Radial and DP pulses normal and equal Abd:  Protuberant--obvious through his clothing even.  + fluid wave and shifting dullness.  No obvious  HSM.  No palpable mass.  + BS Moderate pitting edema entire length of legs  Assessment & Plan   1.  Alcoholic cirrhosis with ascites and peripheral edema:  CBC, CMP.   Start Spironolactone 100 mg and Furosemide 40 mg daily. Follow up Tuesday for BMP, weight check, BP and pulse check. To call for any problems over the weekend. Low sodium diet discussed Elevation of legs Still seems to be in denial of alcoholism as does not have interest in any sort of support group for this. Has had extensive counseling in the past he did not find  particularly helpful. Encouraged him to rethink meeting with our [place], Adelene Amas and to check in with an [place] group. No alcohol whatsoever. Encouraged patient to go to [facility] to obtain Twinrix to prevent further injury to liver.  2.  Anemia:  Was still in 8.0 range at discharge with hemoglobin.  Discussed his anemia could be worsening 3rd spacing of fluid. Pending CBC, to start Ferrous Gluconate 324 mg once to twice daily.  3.  Tobacco abuse:  Does not feel his tobacco use is significant enough to do anything about at this time.  Encouraged him to rethink this as well. Recommended Nicotine gum as he tries to decrease cigarette usage with regular gum.  4.  HM:  He did not receive his influenza vaccine prior to discharge for some reason, no Pneumococcal 23 v. We are out of influenza currently and encouraged him to obtain at upcoming free flu shot clinics in the area. Pneumococcal 23 v today.  5.  Social:  Paperwork for application to [employer] given. To also apply for orange card.

## 2019-06-26 LAB — COMPREHENSIVE METABOLIC PANEL
ALT: 11 IU/L (ref 0–44)
AST: 45 IU/L — ABNORMAL HIGH (ref 0–40)
Albumin/Globulin Ratio: 1 — ABNORMAL LOW (ref 1.2–2.2)
Albumin: 3 g/dL — ABNORMAL LOW (ref 4.0–5.0)
Alkaline Phosphatase: 87 IU/L (ref 39–117)
BUN/Creatinine Ratio: 11 (ref 9–20)
BUN: 7 mg/dL (ref 6–24)
Bilirubin Total: 1.5 mg/dL — ABNORMAL HIGH (ref 0.0–1.2)
CO2: 24 mmol/L (ref 20–29)
Calcium: 8.4 mg/dL — ABNORMAL LOW (ref 8.7–10.2)
Chloride: 105 mmol/L (ref 96–106)
Creatinine, Ser: 0.66 mg/dL — ABNORMAL LOW (ref 0.76–1.27)
GFR calc Af Amer: 133 mL/min/{1.73_m2} (ref >59)
GFR calc non Af Amer: 115 mL/min/{1.73_m2} (ref >59)
Globulin, Total: 3 g/dL (ref 1.5–4.5)
Glucose: 86 mg/dL (ref 65–99)
Potassium: 4.3 mmol/L (ref 3.5–5.2)
Sodium: 140 mmol/L (ref 134–144)
Total Protein: 6 g/dL (ref 6.0–8.5)

## 2019-06-26 LAB — CBC WITH DIFFERENTIAL/PLATELET
Basophils Absolute: 0.1 10*3/uL (ref 0.0–0.2)
Basos: 1 %
EOS (ABSOLUTE): 0.3 10*3/uL (ref 0.0–0.4)
Eos: 2 %
Hematocrit: 36.5 % — ABNORMAL LOW (ref 37.5–51.0)
Hemoglobin: 12 g/dL — ABNORMAL LOW (ref 13.0–17.7)
Immature Grans (Abs): 0.1 10*3/uL (ref 0.0–0.1)
Immature Granulocytes: 1 %
Lymphocytes Absolute: 1.7 10*3/uL (ref 0.7–3.1)
Lymphs: 11 %
MCH: 31.8 pg (ref 26.6–33.0)
MCHC: 32.9 g/dL (ref 31.5–35.7)
MCV: 97 fL (ref 79–97)
Monocytes Absolute: 0.9 10*3/uL (ref 0.1–0.9)
Monocytes: 5 %
Neutrophils Absolute: 13.3 10*3/uL — ABNORMAL HIGH (ref 1.4–7.0)
Neutrophils: 80 %
Platelets: 190 10*3/uL (ref 150–450)
RBC: 3.77 x10E6/uL — ABNORMAL LOW (ref 4.14–5.80)
RDW: 13.3 % (ref 11.6–15.4)
WBC: 16.4 10*3/uL — ABNORMAL HIGH (ref 3.4–10.8)

## 2019-06-30 ENCOUNTER — Other Ambulatory Visit: Payer: Self-pay

## 2019-06-30 VITALS — BP 92/60 | HR 88 | Wt 182.5 lb

## 2019-06-30 DIAGNOSIS — D72829 Elevated white blood cell count, unspecified: Secondary | ICD-10-CM

## 2019-06-30 DIAGNOSIS — K7031 Alcoholic cirrhosis of liver with ascites: Secondary | ICD-10-CM

## 2019-06-30 MED ORDER — SPIRONOLACTONE 100 MG PO TABS: ORAL_TABLET | ORAL | 11 refills | Status: DC

## 2019-06-30 NOTE — Progress Notes (Signed)
Here for weight check, CBC, BMP and BP, pulse check . Feels his abdomen is less distended with fluid and fluid decreased in legs. Down about 16 lbs from 198 lbs last week to 182.5 lbs today His baseline is usually 185 lbs.  He will decrease Spironolactone to 50 mg Hold Furosemide Goal weight is 185 lbs or less. Weigh daily See patient instructions for increase of Spironolactone and adding Furosemide back if weight increases

## 2019-06-30 NOTE — Patient Instructions (Signed)
Hold Furosemide for now. Decrease Spironolactone to 50 mg 1/2 tab by mouth in the morning. If you gain above 185 lbs, increase Spironolactone to 50 mg twice daily. If you continue to gain weight despite the increase in Spironolactone, restart Furosemide as well Weigh yourself this morning at home and call that weight in. Weigh every morning first thing.

## 2019-07-01 LAB — CBC WITH DIFFERENTIAL/PLATELET
Basophils Absolute: 0.1 10*3/uL (ref 0.0–0.2)
Basos: 1 %
EOS (ABSOLUTE): 0.4 10*3/uL (ref 0.0–0.4)
Eos: 3 %
Hematocrit: 38.7 % (ref 37.5–51.0)
Hemoglobin: 13.2 g/dL (ref 13.0–17.7)
Immature Grans (Abs): 0.1 10*3/uL (ref 0.0–0.1)
Immature Granulocytes: 1 %
Lymphocytes Absolute: 1.7 10*3/uL (ref 0.7–3.1)
Lymphs: 11 %
MCH: 32.5 pg (ref 26.6–33.0)
MCHC: 34.1 g/dL (ref 31.5–35.7)
MCV: 95 fL (ref 79–97)
Monocytes Absolute: 0.9 10*3/uL (ref 0.1–0.9)
Monocytes: 6 %
Neutrophils Absolute: 12 10*3/uL — ABNORMAL HIGH (ref 1.4–7.0)
Neutrophils: 78 %
Platelets: 212 10*3/uL (ref 150–450)
RBC: 4.06 x10E6/uL — ABNORMAL LOW (ref 4.14–5.80)
RDW: 13.4 % (ref 11.6–15.4)
WBC: 15.2 10*3/uL — ABNORMAL HIGH (ref 3.4–10.8)

## 2019-07-01 LAB — BASIC METABOLIC PANEL
BUN/Creatinine Ratio: 11 (ref 9–20)
BUN: 8 mg/dL (ref 6–24)
CO2: 22 mmol/L (ref 20–29)
Calcium: 8.9 mg/dL (ref 8.7–10.2)
Chloride: 101 mmol/L (ref 96–106)
Creatinine, Ser: 0.7 mg/dL — ABNORMAL LOW (ref 0.76–1.27)
GFR calc Af Amer: 130 mL/min/{1.73_m2} (ref >59)
GFR calc non Af Amer: 112 mL/min/{1.73_m2} (ref >59)
Glucose: 90 mg/dL (ref 65–99)
Potassium: 4.6 mmol/L (ref 3.5–5.2)
Sodium: 137 mmol/L (ref 134–144)

## 2019-07-04 DIAGNOSIS — R609 Edema, unspecified: Secondary | ICD-10-CM | POA: Insufficient documentation

## 2019-07-04 DIAGNOSIS — K7031 Alcoholic cirrhosis of liver with ascites: Secondary | ICD-10-CM | POA: Insufficient documentation

## 2019-07-04 DIAGNOSIS — K7030 Alcoholic cirrhosis of liver without ascites: Secondary | ICD-10-CM | POA: Insufficient documentation

## 2019-07-09 ENCOUNTER — Other Ambulatory Visit: Payer: Self-pay

## 2019-07-09 ENCOUNTER — Encounter: Payer: Self-pay | Admitting: Internal Medicine

## 2019-07-09 ENCOUNTER — Ambulatory Visit (INDEPENDENT_AMBULATORY_CARE_PROVIDER_SITE_OTHER): Payer: Self-pay | Admitting: Internal Medicine

## 2019-07-09 VITALS — BP 100/64 | HR 70 | Temp 98.6°F | Resp 12 | Ht 73.0 in | Wt 179.0 lb

## 2019-07-09 DIAGNOSIS — D72829 Elevated white blood cell count, unspecified: Secondary | ICD-10-CM

## 2019-07-09 DIAGNOSIS — K7031 Alcoholic cirrhosis of liver with ascites: Secondary | ICD-10-CM

## 2019-07-09 NOTE — Progress Notes (Signed)
    Subjective:    Patient ID: Stephen Miranda, male   DOB: May 29, 1971, 48 y.o.   MRN: [id number]   HPI   Cirrhosis with ascites:  Still sometimes with light headedness when stands from a sitting position. He has dropped 3 more pounds after stopping Furosemide and decreasing Spironolactone to 50 mg daily.   Feels very fatigued, though unchanged from when returned for care.  States just not improving. No abdominal pain, hematochezia or melena. No fevers.   Legs without edema.   Current Meds  Medication Sig  . ferrous gluconate (FERGON) 324 MG tablet 1 tab by mouth twice daily.  . folic acid (FOLVITE) 1 MG tablet Take 1 tablet (1 mg total) by mouth daily.  . Multiple Vitamin (MULTIVITAMIN WITH MINERALS) TABS tablet Take 1 tablet by mouth daily.  . pantoprazole (PROTONIX) 40 MG tablet Take 1 tablet (40 mg total) by mouth daily.  . propranolol (INDERAL) 10 MG tablet Take 1 tablet (10 mg total) by mouth 2 (two) times daily. Hold if HR < 60 or SBP < 85  . spironolactone (ALDACTONE) 100 MG tablet 1/2 tab by mouth in the morning daily  . thiamine 100 MG tablet Take 1 tablet (100 mg total) by mouth daily.   No Known Allergies   Review of Systems    Objective:   BP 100/64 (BP Location: Left Arm, Patient Position: Sitting, Cuff Size: Normal)   Pulse 70   Temp 98.6 F (37 C)   Resp 12   Ht 6\' 1"  (1.854 m)   Wt 179 lb (81.2 kg)   BMI 23.62 kg/m   Physical Exam  NAD Chronically ill appearing, pasty color Lungs:  CTA CV:  RRR without murmur or rub, Radial pulses normal and equal Abd:  S, NT, No HSM or mass, No fluid wave, + BS LE:  Trace pitting edema pretibial areas bilaterally   Assessment & Plan  1.  Alcoholic cirrhosis with ascites and peripheral edema:  .To continue with plan to increase diuretic use if weight climbs above 185 lbs.   CMP Follow up in 1 month  2.  Leukocytosis:  CBC  3.  Alcoholism:  Discussed concern he is not obtaining any long term support to treat  alcoholism.   Discussed my concern he may be in denial of a problem.   He does not feel he is and dismisses my concerns.  States the more he thinks about getting support, the more he thinks about wanting to drink.   Urged him to reconsider.

## 2019-07-09 NOTE — Patient Instructions (Signed)
Same instructions for weight above 185 lbs.

## 2019-07-10 LAB — CBC WITH DIFFERENTIAL/PLATELET
Basophils Absolute: 0.1 10*3/uL (ref 0.0–0.2)
Basos: 1 %
EOS (ABSOLUTE): 0.2 10*3/uL (ref 0.0–0.4)
Eos: 2 %
Hematocrit: 35.2 % — ABNORMAL LOW (ref 37.5–51.0)
Hemoglobin: 12.4 g/dL — ABNORMAL LOW (ref 13.0–17.7)
Immature Grans (Abs): 0 10*3/uL (ref 0.0–0.1)
Immature Granulocytes: 0 %
Lymphocytes Absolute: 1.9 10*3/uL (ref 0.7–3.1)
Lymphs: 14 %
MCH: 31 pg (ref 26.6–33.0)
MCHC: 35.2 g/dL (ref 31.5–35.7)
MCV: 88 fL (ref 79–97)
Monocytes Absolute: 1.3 10*3/uL — ABNORMAL HIGH (ref 0.1–0.9)
Monocytes: 9 %
Neutrophils Absolute: 10.7 10*3/uL — ABNORMAL HIGH (ref 1.4–7.0)
Neutrophils: 74 %
Platelets: 191 10*3/uL (ref 150–450)
RBC: 4 x10E6/uL — ABNORMAL LOW (ref 4.14–5.80)
RDW: 13.2 % (ref 11.6–15.4)
WBC: 14.3 10*3/uL — ABNORMAL HIGH (ref 3.4–10.8)

## 2019-07-10 LAB — COMPREHENSIVE METABOLIC PANEL
ALT: 10 IU/L (ref 0–44)
AST: 38 IU/L (ref 0–40)
Albumin/Globulin Ratio: 0.9 — ABNORMAL LOW (ref 1.2–2.2)
Albumin: 3.6 g/dL — ABNORMAL LOW (ref 4.0–5.0)
Alkaline Phosphatase: 76 IU/L (ref 39–117)
BUN/Creatinine Ratio: 8 — ABNORMAL LOW (ref 9–20)
BUN: 6 mg/dL (ref 6–24)
Bilirubin Total: 2.1 mg/dL — ABNORMAL HIGH (ref 0.0–1.2)
CO2: 23 mmol/L (ref 20–29)
Calcium: 9 mg/dL (ref 8.7–10.2)
Chloride: 100 mmol/L (ref 96–106)
Creatinine, Ser: 0.76 mg/dL (ref 0.76–1.27)
GFR calc Af Amer: 126 mL/min/{1.73_m2} (ref >59)
GFR calc non Af Amer: 109 mL/min/{1.73_m2} (ref >59)
Globulin, Total: 3.9 g/dL (ref 1.5–4.5)
Glucose: 95 mg/dL (ref 65–99)
Potassium: 5.3 mmol/L — ABNORMAL HIGH (ref 3.5–5.2)
Sodium: 134 mmol/L (ref 134–144)
Total Protein: 7.5 g/dL (ref 6.0–8.5)

## 2019-08-11 ENCOUNTER — Other Ambulatory Visit: Payer: Self-pay

## 2019-08-11 ENCOUNTER — Encounter: Payer: Self-pay | Admitting: Internal Medicine

## 2019-08-11 ENCOUNTER — Ambulatory Visit (INDEPENDENT_AMBULATORY_CARE_PROVIDER_SITE_OTHER): Payer: Self-pay | Admitting: Internal Medicine

## 2019-08-11 VITALS — BP 102/60 | HR 78 | Resp 12 | Ht 73.0 in | Wt 176.0 lb

## 2019-08-11 DIAGNOSIS — E875 Hyperkalemia: Secondary | ICD-10-CM

## 2019-08-11 DIAGNOSIS — D649 Anemia, unspecified: Secondary | ICD-10-CM

## 2019-08-11 DIAGNOSIS — K7031 Alcoholic cirrhosis of liver with ascites: Secondary | ICD-10-CM

## 2019-08-11 DIAGNOSIS — L989 Disorder of the skin and subcutaneous tissue, unspecified: Secondary | ICD-10-CM

## 2019-08-11 DIAGNOSIS — G4700 Insomnia, unspecified: Secondary | ICD-10-CM

## 2019-08-11 MED ORDER — TRAZODONE HCL 50 MG PO TABS: ORAL_TABLET | ORAL | 11 refills | Status: DC

## 2019-08-11 MED ORDER — FUROSEMIDE 40 MG PO TABS: ORAL_TABLET | ORAL | 11 refills | Status: DC

## 2019-08-11 MED ORDER — SPIRONOLACTONE 50 MG PO TABS: ORAL_TABLET | ORAL | 11 refills | Status: DC

## 2019-08-11 MED ORDER — PANTOPRAZOLE SODIUM 40 MG PO TBEC: DELAYED_RELEASE_TABLET | ORAL | 3 refills | Status: DC

## 2019-08-11 NOTE — Progress Notes (Signed)
Subjective:    Patient ID: Stephen Miranda, male   DOB: 12-17-1970, 49 y.o.   MRN: [id number]   HPI   1.  Cirrhosis with ascites:  Out of Pantoprazole.  Paying $8 for 30.  No swelling of leg.  Abdomen is soft and not tight as when had ascites. Taking Spironolactone 50 mg daily. .     2.  Insomnia:  Cannot initiate sleep.  Gets up and watches TV until nods off then back to bed and cannot get to sleep again.   Regular bed time and regular wake time. Has tried Melatonin and does not help CBD oil has helped in the past. Has never tried any prescription meds. Not using alcohol. Has never used Trazodone.  3.  Weight:  Eating fine  Has an appetite now.  Still feeling tired.    4.  Lesion on left face for months that is not healing.  He is not sure if related to mask band/shaving or if more concerning.  History of Melanoma.  Does not have orange card application in.,   Current Meds  Medication Sig  . Cyanocobalamin (VITAMIN B-12) 1000 MCG SUBL Place under the tongue. 1 daily  . ferrous gluconate (FERGON) 324 MG tablet 1 tab by mouth twice daily. (Patient taking differently: 1 tab by mouth  daily.)  . folic acid (FOLVITE) 1 MG tablet Take 1 tablet (1 mg total) by mouth daily.  . Multiple Vitamin (MULTIVITAMIN WITH MINERALS) TABS tablet Take 1 tablet by mouth daily.  . pantoprazole (PROTONIX) 40 MG tablet 1 tab by mouth daily on empty stomach  . propranolol (INDERAL) 10 MG tablet Take 1 tablet (10 mg total) by mouth 2 (two) times daily. Hold if HR < 60 or SBP < 85  . spironolactone (ALDACTONE) 50 MG tablet 1/2 tab by mouth in the morning daily  . thiamine 100 MG tablet Take 1 tablet (100 mg total) by mouth daily.  . [DISCONTINUED] pantoprazole (PROTONIX) 40 MG tablet Take 1 tablet (40 mg total) by mouth daily.  . [DISCONTINUED] spironolactone (ALDACTONE) 100 MG tablet 1/2 tab by mouth in the morning daily      No Known Allergies   Review of Systems    Objective:   BP 102/60 (BP  Location: Left Arm, Patient Position: Sitting, Cuff Size: Normal)   Pulse 78   Resp 12   Ht 6\' 1"  (1.854 m)   Wt 176 lb (79.8 kg)   BMI 23.22 kg/m   Physical Exam  NAD Appears chronically ill, pale. HEENT:  Mildly inflamed, flaking lesion at left cheek under where mask ear loop passes against cheek. Neck:   Supple, No adenopathy Chest:  CTA CV:  RRR without murmur or rub.  Radial and DP pulses normal and equal Abd:  S, NT, No HSM or mass or shifting dullness noted.  No fluid wave appreciated. LE:  No edema.   Assessment & Plan   1.  Alcoholic cirrhosis with ascites and hyperkalemia: Continues on Propranolol, Spironolactone. Checking BMP as has had some hyperkalemia.   2.  Anemia:  CBC, continuing on B vitamins.  Pantoprazole to cover for gastritis.  3.  Alcohol abuse:  Patient unwilling to address at this point.  Feels when he discusses this issue, he just thinks about wanting to drink more. Has had counseling in past, but not currently interested.  4.  Skin lesion on left face:  Referral to Dermatology.  Needs orange card first.  History of melanoma.  5. Insomnia:   Discussed working on sleep hygiene.  Trazodone 50 mg daily.  Trying to avoid him using alcohol to sleep at night. Follow up in 3 months.

## 2019-08-11 NOTE — Patient Instructions (Signed)
Call if no improvement with sleep in next 2 weeks

## 2019-08-13 LAB — CBC WITH DIFFERENTIAL/PLATELET
Basophils Absolute: 0.1 10*3/uL (ref 0.0–0.2)
Basos: 1 %
EOS (ABSOLUTE): 0.2 10*3/uL (ref 0.0–0.4)
Eos: 3 %
Hematocrit: 36.3 % — ABNORMAL LOW (ref 37.5–51.0)
Hemoglobin: 12.5 g/dL — ABNORMAL LOW (ref 13.0–17.7)
Immature Grans (Abs): 0 10*3/uL (ref 0.0–0.1)
Immature Granulocytes: 0 %
Lymphocytes Absolute: 1.7 10*3/uL (ref 0.7–3.1)
Lymphs: 24 %
MCH: 31.8 pg (ref 26.6–33.0)
MCHC: 34.4 g/dL (ref 31.5–35.7)
MCV: 92 fL (ref 79–97)
Monocytes Absolute: 0.8 10*3/uL (ref 0.1–0.9)
Monocytes: 12 %
Neutrophils Absolute: 4.3 10*3/uL (ref 1.4–7.0)
Neutrophils: 60 %
Platelets: 205 10*3/uL (ref 150–450)
RBC: 3.93 x10E6/uL — ABNORMAL LOW (ref 4.14–5.80)
RDW: 13.8 % (ref 11.6–15.4)
WBC: 7.1 10*3/uL (ref 3.4–10.8)

## 2019-08-13 LAB — BASIC METABOLIC PANEL
BUN/Creatinine Ratio: 9 (ref 9–20)
BUN: 6 mg/dL (ref 6–24)
CO2: 24 mmol/L (ref 20–29)
Calcium: 9.6 mg/dL (ref 8.7–10.2)
Chloride: 100 mmol/L (ref 96–106)
Creatinine, Ser: 0.64 mg/dL — ABNORMAL LOW (ref 0.76–1.27)
GFR calc Af Amer: 135 mL/min/{1.73_m2} (ref >59)
GFR calc non Af Amer: 117 mL/min/{1.73_m2} (ref >59)
Glucose: 81 mg/dL (ref 65–99)
Potassium: 4.3 mmol/L (ref 3.5–5.2)
Sodium: 136 mmol/L (ref 134–144)

## 2019-11-09 ENCOUNTER — Other Ambulatory Visit: Payer: Self-pay

## 2019-11-09 ENCOUNTER — Encounter: Payer: Self-pay | Admitting: Internal Medicine

## 2019-11-09 ENCOUNTER — Ambulatory Visit: Payer: Self-pay | Admitting: Internal Medicine

## 2019-11-09 VITALS — BP 102/60 | HR 76 | Resp 12 | Ht 73.0 in | Wt 181.0 lb

## 2019-11-09 DIAGNOSIS — M6281 Muscle weakness (generalized): Secondary | ICD-10-CM

## 2019-11-09 DIAGNOSIS — K429 Umbilical hernia without obstruction or gangrene: Secondary | ICD-10-CM

## 2019-11-09 DIAGNOSIS — L989 Disorder of the skin and subcutaneous tissue, unspecified: Secondary | ICD-10-CM

## 2019-11-09 DIAGNOSIS — K7031 Alcoholic cirrhosis of liver with ascites: Secondary | ICD-10-CM

## 2019-11-09 DIAGNOSIS — L219 Seborrheic dermatitis, unspecified: Secondary | ICD-10-CM

## 2019-11-09 MED ORDER — SPIRONOLACTONE 50 MG PO TABS: ORAL_TABLET | ORAL | 11 refills | Status: DC

## 2019-11-09 MED ORDER — KETOCONAZOLE 1 % EX SHAM: MEDICATED_SHAMPOO | CUTANEOUS | 0 refills | Status: DC

## 2019-11-09 NOTE — Progress Notes (Signed)
    Subjective:    Patient ID: Stephen Miranda, male   DOB: 10/27/1970, 49 y.o.   MRN: [id number]   HPI   1.  Cirrhosis with ascites:  Stopped the spironolactone when he ran out.  Has not had increased swelling of abdomen or legs.  He continues on 40 mg of Furosemide.  Continues with Propranolol.  Taking Thiamine and B12 as well as a multi vitamin. No alcohol.  No epigastric burning.  No melena or hematochezia.    2.  Feels he has an umbilical hernia:  States in place where her felt was a problem before after eating.  Sees something there bulging now.  Notes it more when sitting.  Can have some mild discomfort with it.   3.  Anemia:  Stable at last check in January.  Current Meds  Medication Sig  . Cyanocobalamin (VITAMIN B-12) 1000 MCG SUBL Place under the tongue. 1 daily  . folic acid (FOLVITE) 1 MG tablet Take 1 tablet (1 mg total) by mouth daily.  . furosemide (LASIX) 40 MG tablet 1 tab by mouth daily as needed for increased leg and abdominal swelling  . Multiple Vitamin (MULTIVITAMIN WITH MINERALS) TABS tablet Take 1 tablet by mouth daily.  . pantoprazole (PROTONIX) 40 MG tablet 1 tab by mouth daily on empty stomach  . propranolol (INDERAL) 10 MG tablet Take 1 tablet (10 mg total) by mouth 2 (two) times daily. Hold if HR < 60 or SBP < 85  . thiamine 100 MG tablet Take 1 tablet (100 mg total) by mouth daily.   No Known Allergies   Review of Systems    Objective:   BP 102/60 (BP Location: Left Arm, Patient Position: Sitting, Cuff Size: Normal)   Pulse 76   Resp 12   Ht 6\' 1"  (1.854 m)   Wt 181 lb (82.1 kg)   BMI 23.88 kg/m   Physical Exam  HEENT:  Scattered erythema and flaking on face, particularly T zone and mustache, hairline at sideburns  The lesion at left face under lower facemask earloop appears similarly--not clear today that this is a separate lesion Neck:  Supple, no adenopathy Chest: CTA CV:  RRR without murmur or rub.  Radial pulses normal and equal Abd:  S, +  BS, No fluid wave.  1 cm umbilical hernia, easily reduced.   LE:  No edema    Assessment & Plan   1.  Cirrhosis with ascites:  Would rather he takes Spironolactone than Furosemide.  Spironolactone 25 mg daily.  Decrease Furosemide to 20 mg daily for 1 week, then stop Furosemide.  To call if edema or abdominal fluid recurs or if weight gain.  CMP, CBC in 6 weeks.  2.  Skin lesion of cheek:  Would like to send to Dermatology for evaluation with history of melanoma, though this appears more like either actinic keratosis or part of seborrhea  3.  Seborrheic Dermatitis:  Ketoconazole shampoo applied to affected areas at beginning of shower and wash off at end.  3.  Umbilical hernia:  Unable to refer to surgery as still has not completed application for orange card.  Referral written and on hold for orange card

## 2019-11-09 NOTE — Patient Instructions (Addendum)
Restart Spironolactone at 25 mg daily and decrease Furosemide to 20 mg daily for 1 week, then stop the Furosemide. Call if edema or abdominal fluid build up again or if gaining weight.  Get your orange card for Derm and surgery referrals.

## 2019-11-17 ENCOUNTER — Telehealth: Payer: Self-pay | Admitting: Internal Medicine

## 2019-11-17 NOTE — Telephone Encounter (Signed)
Called and left a voicemail to remind and encourage patient to complete and send orange card application and to call back to confirm message was received.

## 2019-12-23 ENCOUNTER — Other Ambulatory Visit (INDEPENDENT_AMBULATORY_CARE_PROVIDER_SITE_OTHER): Payer: Self-pay

## 2019-12-23 ENCOUNTER — Other Ambulatory Visit: Payer: Self-pay

## 2019-12-23 DIAGNOSIS — E875 Hyperkalemia: Secondary | ICD-10-CM

## 2019-12-23 DIAGNOSIS — D649 Anemia, unspecified: Secondary | ICD-10-CM

## 2019-12-24 LAB — COMPREHENSIVE METABOLIC PANEL
ALT: 11 IU/L (ref 0–44)
AST: 19 IU/L (ref 0–40)
Albumin/Globulin Ratio: 1.1 — ABNORMAL LOW (ref 1.2–2.2)
Albumin: 3.9 g/dL — ABNORMAL LOW (ref 4.0–5.0)
Alkaline Phosphatase: 108 IU/L (ref 48–121)
BUN/Creatinine Ratio: 10 (ref 9–20)
BUN: 8 mg/dL (ref 6–24)
Bilirubin Total: 0.5 mg/dL (ref 0.0–1.2)
CO2: 26 mmol/L (ref 20–29)
Calcium: 9.7 mg/dL (ref 8.7–10.2)
Chloride: 102 mmol/L (ref 96–106)
Creatinine, Ser: 0.79 mg/dL (ref 0.76–1.27)
GFR calc Af Amer: 123 mL/min/{1.73_m2} (ref >59)
GFR calc non Af Amer: 106 mL/min/{1.73_m2} (ref >59)
Globulin, Total: 3.5 g/dL (ref 1.5–4.5)
Glucose: 135 mg/dL — ABNORMAL HIGH (ref 65–99)
Potassium: 4.2 mmol/L (ref 3.5–5.2)
Sodium: 140 mmol/L (ref 134–144)
Total Protein: 7.4 g/dL (ref 6.0–8.5)

## 2019-12-24 LAB — CBC WITH DIFFERENTIAL/PLATELET
Basophils Absolute: 0 10*3/uL (ref 0.0–0.2)
Basos: 1 %
EOS (ABSOLUTE): 0.2 10*3/uL (ref 0.0–0.4)
Eos: 3 %
Hematocrit: 37.1 % — ABNORMAL LOW (ref 37.5–51.0)
Hemoglobin: 12.3 g/dL — ABNORMAL LOW (ref 13.0–17.7)
Immature Grans (Abs): 0 10*3/uL (ref 0.0–0.1)
Immature Granulocytes: 0 %
Lymphocytes Absolute: 1.5 10*3/uL (ref 0.7–3.1)
Lymphs: 28 %
MCH: 31.1 pg (ref 26.6–33.0)
MCHC: 33.2 g/dL (ref 31.5–35.7)
MCV: 94 fL (ref 79–97)
Monocytes Absolute: 0.5 10*3/uL (ref 0.1–0.9)
Monocytes: 10 %
Neutrophils Absolute: 3.1 10*3/uL (ref 1.4–7.0)
Neutrophils: 58 %
Platelets: 177 10*3/uL (ref 150–450)
RBC: 3.96 x10E6/uL — ABNORMAL LOW (ref 4.14–5.80)
RDW: 12 % (ref 11.6–15.4)
WBC: 5.3 10*3/uL (ref 3.4–10.8)

## 2020-02-15 DIAGNOSIS — L989 Disorder of the skin and subcutaneous tissue, unspecified: Secondary | ICD-10-CM | POA: Insufficient documentation

## 2020-02-15 DIAGNOSIS — K429 Umbilical hernia without obstruction or gangrene: Secondary | ICD-10-CM | POA: Insufficient documentation

## 2020-03-10 ENCOUNTER — Encounter: Payer: Self-pay | Admitting: Internal Medicine

## 2020-04-25 ENCOUNTER — Encounter: Payer: Self-pay | Admitting: Internal Medicine

## 2021-02-17 IMAGING — DX DG CHEST 1V PORT
1 series · 1 of 1 positions shown · non-contrast
Comparison: None.

CLINICAL DATA: Chest pain nausea vomiting

EXAM:
PORTABLE CHEST 1 VIEW

[chest]
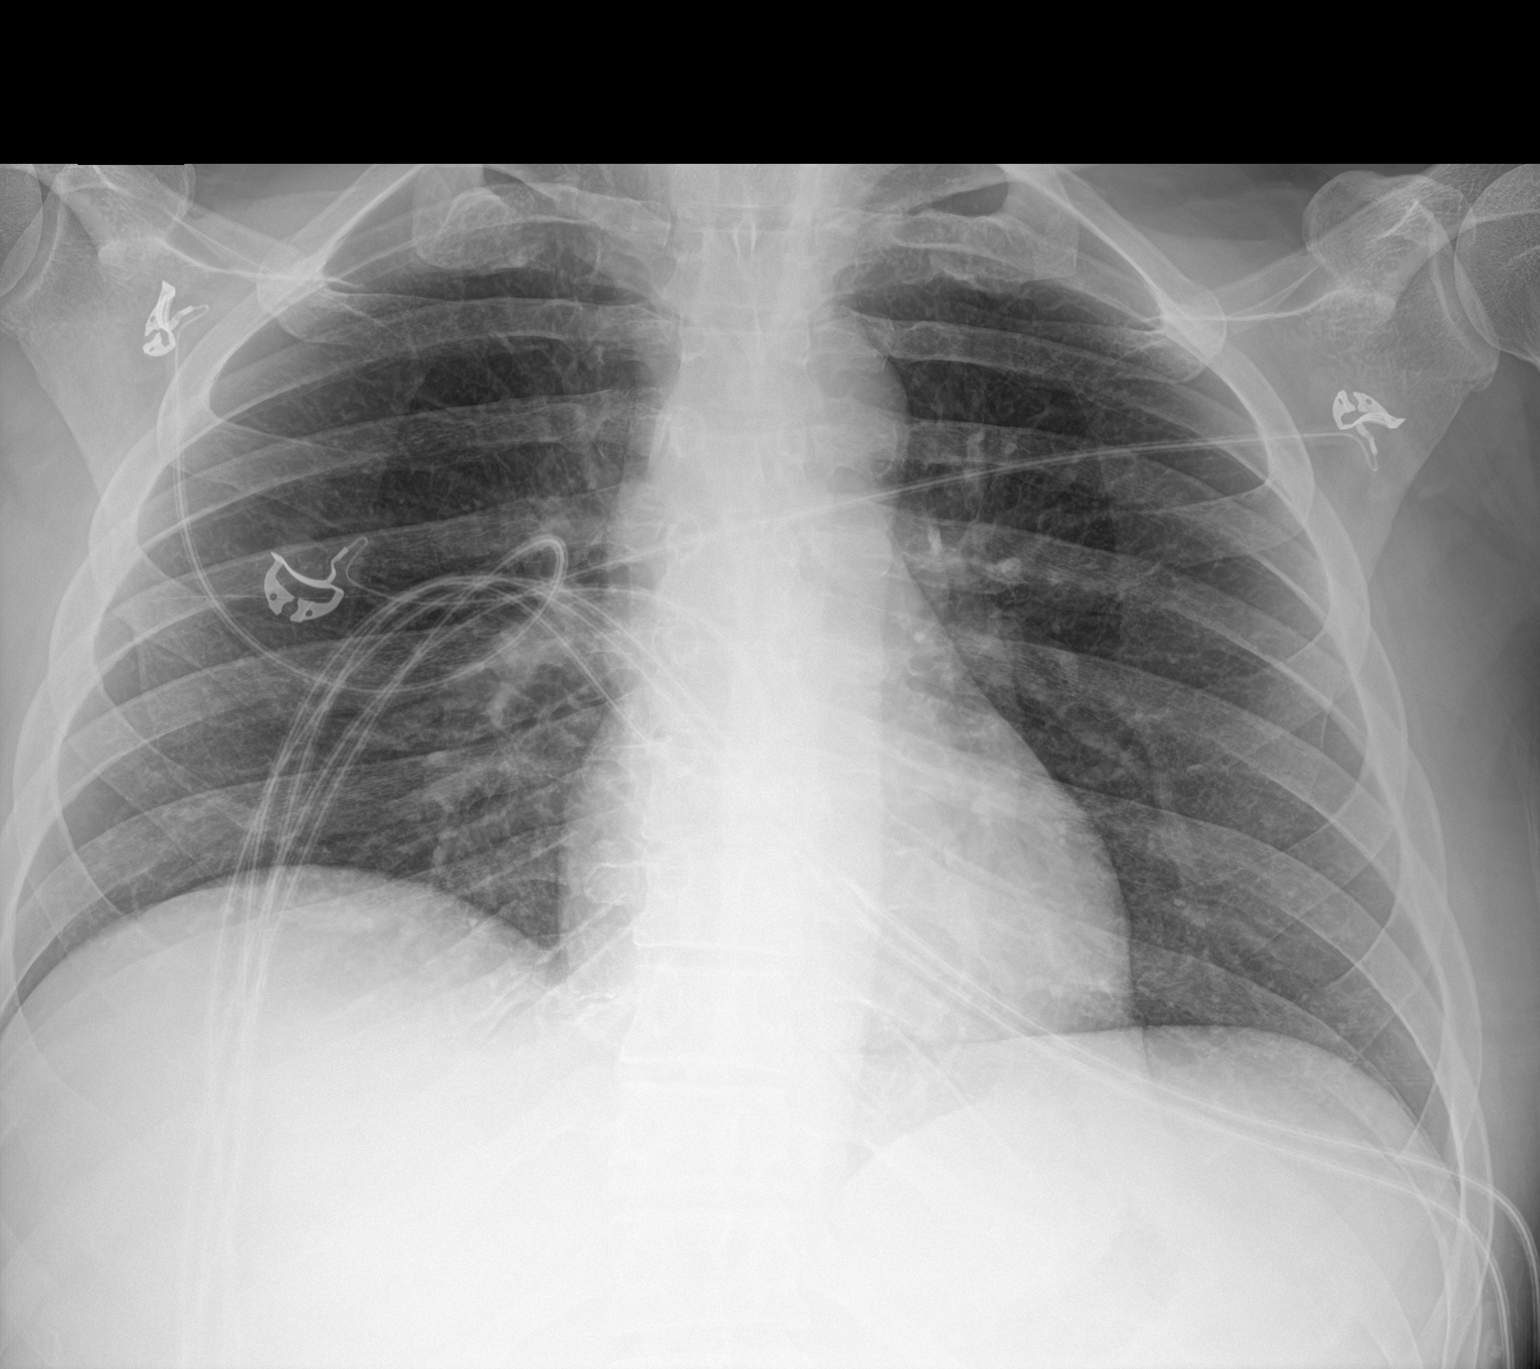

[1 of 1 positions shown; findings below may reference images not displayed]

FINDINGS: The heart size and mediastinal contours are within normal limits.
Both lungs are clear. The visualized skeletal structures are
unremarkable.
IMPRESSION: No active disease.

## 2021-02-18 IMAGING — CT CT ABD-PEL WO/W CM
3 of 13 series · 11 of 46 positions shown, 17 images · IV contrast (APPLIED)
Comparison: None.

CLINICAL DATA: Acute onset of abdominal pain, hematemesis, and
melena beginning in this morning. Elevated liver function tests and
thrombocytopenia.

EXAM:
CT ABDOMEN AND PELVIS WITHOUT AND WITH CONTRAST
TECHNIQUE: Multidetector CT imaging of the abdomen and pelvis was performed
following the standard protocol before and following the bolus
administration of intravenous contrast.
CONTRAST:  100mL OMNIPAQUE IOHEXOL 300 MG/ML  SOLN

[Series 3: ap pre · axial · non-contrast · 0.80mm/px · z∈[+112,+282]mm · 3 of 68 slices shown]
[im 17/68  soft-tissue]
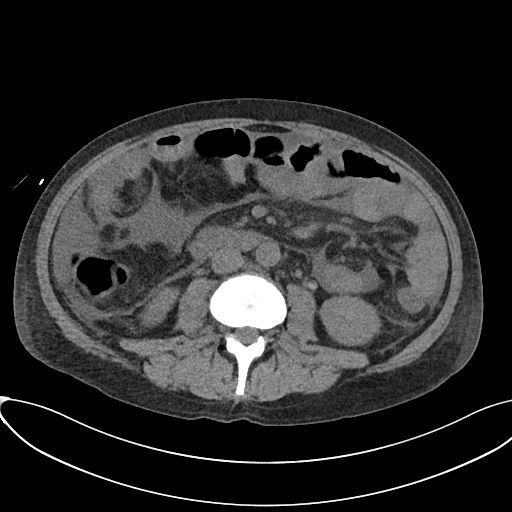
[im 34/68  soft-tissue]
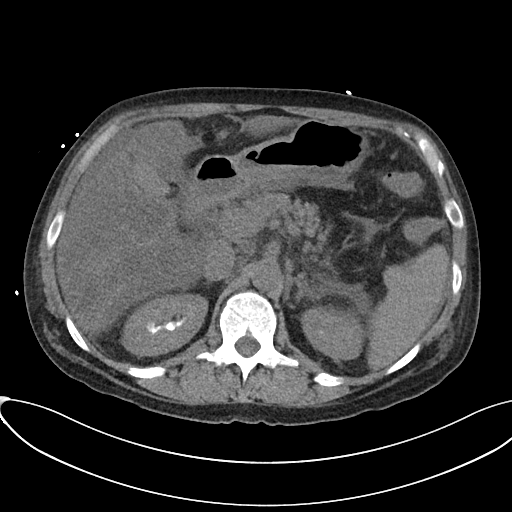
[im 51/68  soft-tissue]
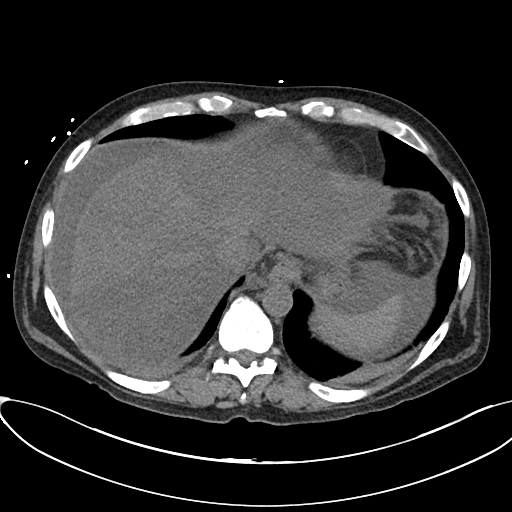

[Series 6: cor pre · coronal · non-contrast · 0.68mm/px · 2 of 102 slices shown, 3 images]
[im 34/102  soft-tissue]
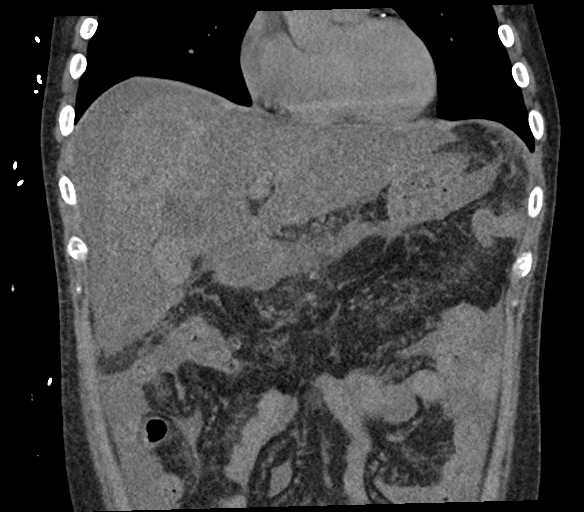
[im 34/102  bone]
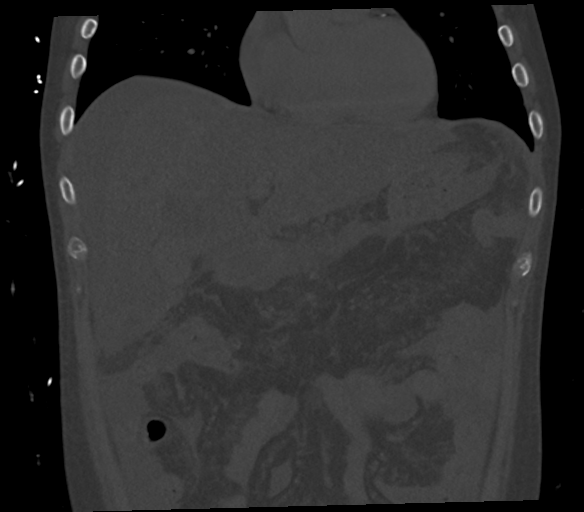
[im 68/102  soft-tissue]
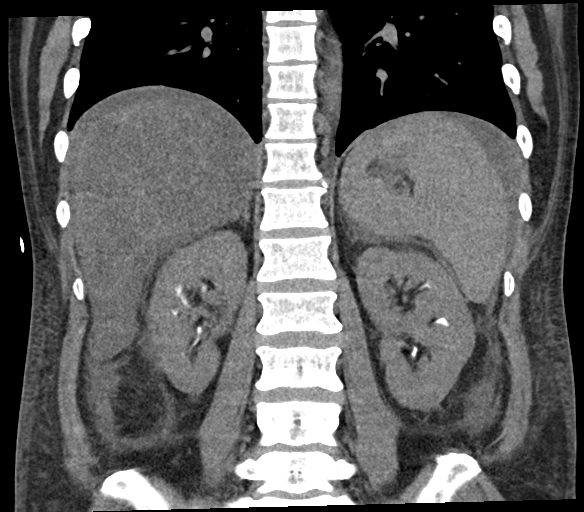

[Series 13: ap post · axial · 0.85mm/px · z∈[-112,+288]mm · 6 of 113 slices shown, 11 images]
[im 17/113  soft-tissue]
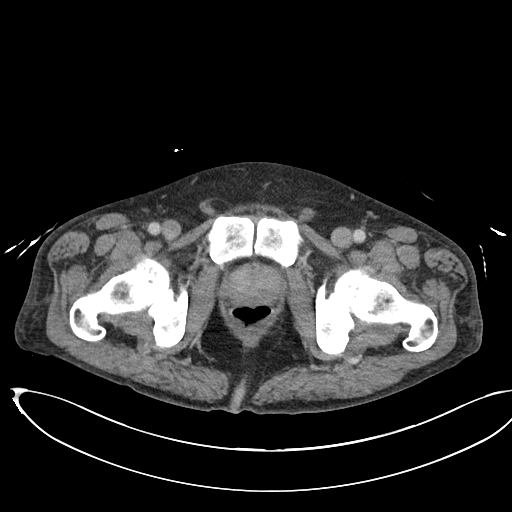
[im 17/113  bone]
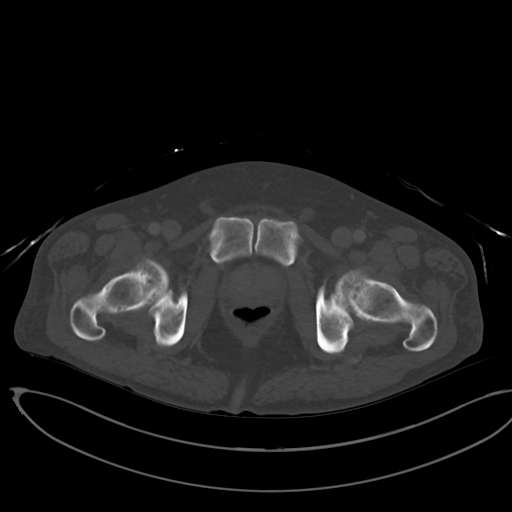
[im 33/113  soft-tissue]
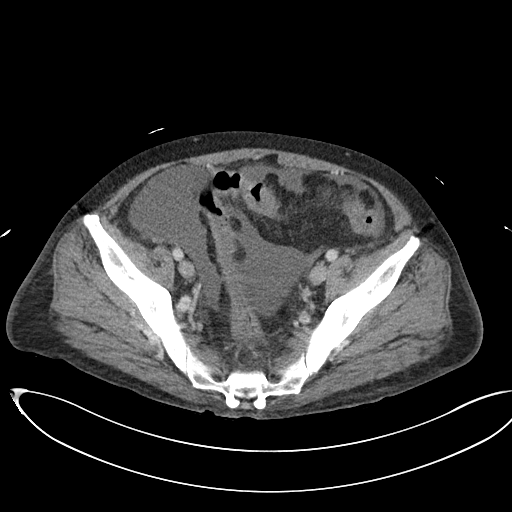
[im 49/113  soft-tissue]
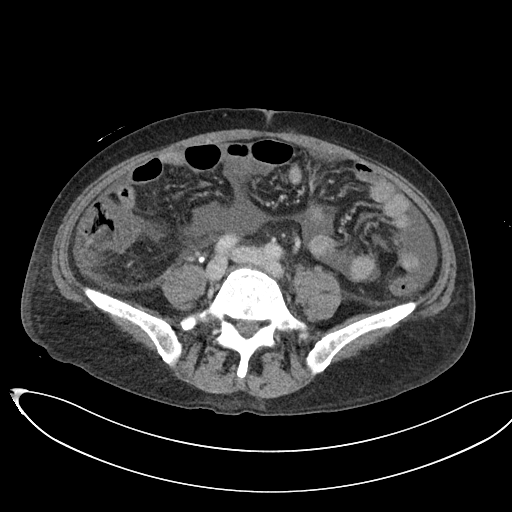
[im 49/113  lung]
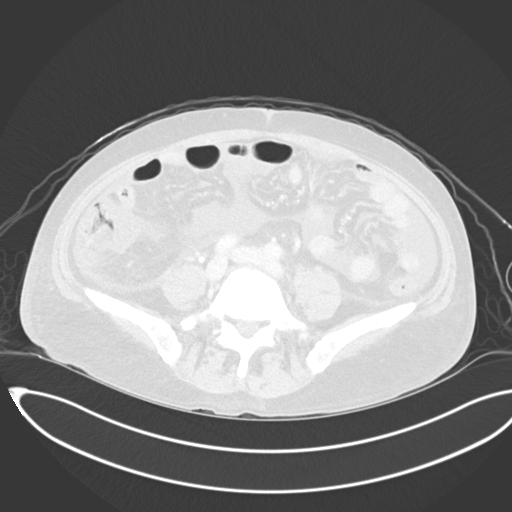
[im 65/113  soft-tissue]
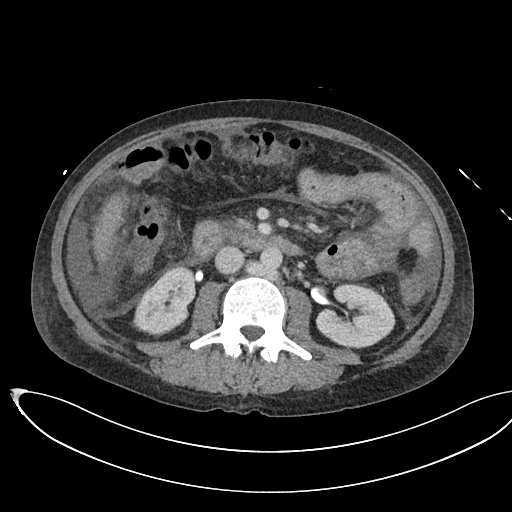
[im 65/113  lung]
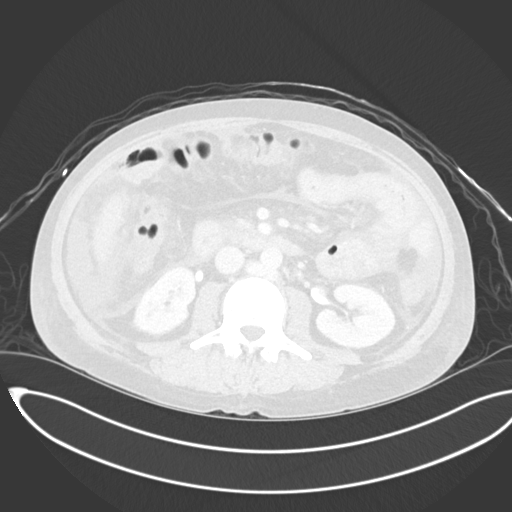
[im 81/113  soft-tissue]
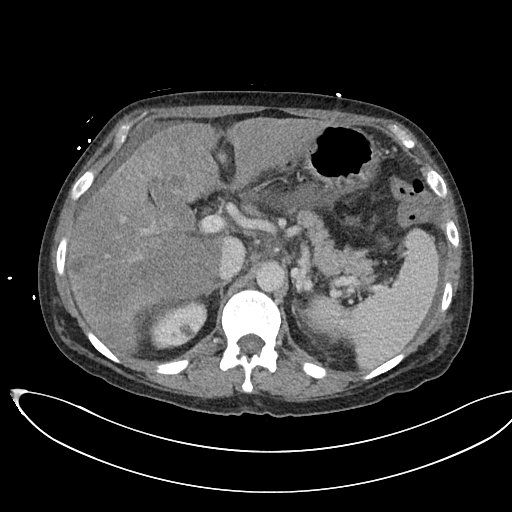
[im 81/113  lung]
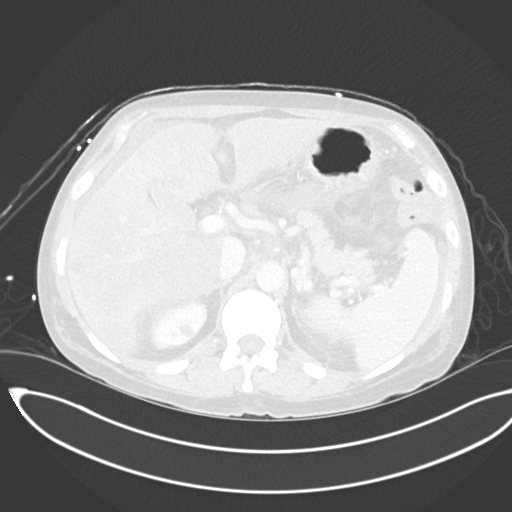
[im 97/113  soft-tissue]
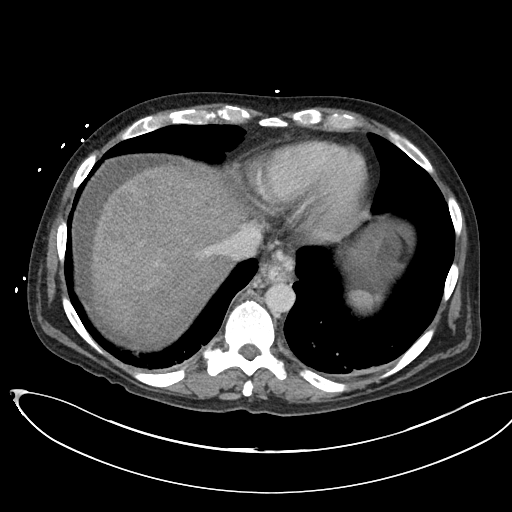
[im 97/113  lung]
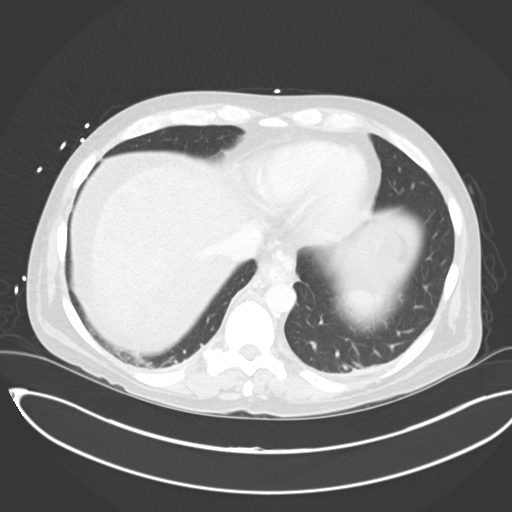

[11 of 46 positions shown; findings below may reference images not displayed]

FINDINGS: Lower Chest: No acute findings.

Hepatobiliary: Heterogeneous pattern of moderate to severe steatosis
is seen throughout the liver. A few tiny sub-cm hepatic cysts are
noted, but no liver masses are identified. Although there are no
definite gross morphologic findings of cirrhosis, recanalization of
paraumbilical veins is highly suspicious for portal venous
hypertension. Gallbladder is unremarkable. No evidence of biliary
ductal dilatation.

Pancreas:  No mass or inflammatory changes.

Spleen: Within normal limits in size and appearance.

Adrenals/Urinary Tract: Several small cysts are noted in both
kidneys. No masses identified. No evidence of hydronephrosis.

Stomach/Bowel: No evidence of obstruction, inflammatory process or
abscess.

Vascular/Lymphatic: No pathologically enlarged lymph nodes. No
abdominal aortic aneurysm. Mild to moderate gastroesophageal varices
are seen, consistent with portal venous hypertension.

Reproductive:  No mass or other significant abnormality.

Other:  Mild-to-moderate ascites.

Musculoskeletal:  No suspicious bone lesions identified.
IMPRESSION: Diffuse heterogeneous pattern of hepatic steatosis. No hepatic mass
identified.

Findings highly suspicious for portal venous hypertension, including
gastroesophageal varices and ascites.

## 2021-12-28 ENCOUNTER — Ambulatory Visit: Payer: Self-pay | Admitting: Internal Medicine

## 2021-12-28 ENCOUNTER — Encounter: Payer: Self-pay | Admitting: Internal Medicine

## 2021-12-28 VITALS — BP 120/72 | HR 84 | Resp 12 | Ht 73.5 in | Wt 183.0 lb

## 2021-12-28 DIAGNOSIS — E538 Deficiency of other specified B group vitamins: Secondary | ICD-10-CM

## 2021-12-28 DIAGNOSIS — K7031 Alcoholic cirrhosis of liver with ascites: Secondary | ICD-10-CM

## 2021-12-28 DIAGNOSIS — L989 Disorder of the skin and subcutaneous tissue, unspecified: Secondary | ICD-10-CM

## 2021-12-28 DIAGNOSIS — R7989 Other specified abnormal findings of blood chemistry: Secondary | ICD-10-CM

## 2021-12-28 DIAGNOSIS — G609 Hereditary and idiopathic neuropathy, unspecified: Secondary | ICD-10-CM

## 2021-12-28 DIAGNOSIS — Z5941 Food insecurity: Secondary | ICD-10-CM

## 2021-12-28 DIAGNOSIS — F5104 Psychophysiologic insomnia: Secondary | ICD-10-CM

## 2021-12-28 DIAGNOSIS — Z23 Encounter for immunization: Secondary | ICD-10-CM

## 2021-12-28 MED ORDER — GABAPENTIN 100 MG PO CAPS: ORAL_CAPSULE | ORAL | 11 refills | Status: DC

## 2021-12-28 MED ORDER — CARVEDILOL 3.125 MG PO TABS: 3.1250 mg | ORAL_TABLET | Freq: Two times a day (BID) | ORAL | 11 refills | Status: DC

## 2021-12-28 MED ORDER — SUPER B COMPLEX MAXI PO TABS: ORAL_TABLET | ORAL | Status: DC

## 2021-12-28 NOTE — Progress Notes (Signed)
     Subjective:    Patient ID: Stephen Miranda, male   DOB: 04-02-1971, 51 y.o.   MRN: [id number]   HPI Has not folllowed up for 2 years.   Cirrhosis with history of ascites and gastropathy, esophageal varices.  Has been off all meds about 1 year.  Cost of visits keeps him from maintaining visits and care.  Has not had any ascites or LE edema since before last seen.  No diuretics for about 1 year as well. Is good about watching salt intake.  States alcohol is limited.  Only drinking on holidays.  May drink to sleep.  Also not taking vitamins as before.  2.  Feet hurting for few months.  Numbness and tingling with aching.  Pain is all the time--aching and mainly plantar surface.  Toes also hurt--tips digging into surface with walking.  Did not tolerate gabapentin for restless legs back in 2017.  [place] not on Patient Assistance program any longer.  Current Meds  Medication Sig   Multiple Vitamin (MULTIVITAMIN WITH MINERALS) TABS tablet Take 1 tablet by mouth daily.   No Known Allergies   Review of Systems    Objective:   BP 120/72 (BP Location: Right Arm, Patient Position: Sitting, Cuff Size: Normal)   Pulse 84   Resp 12   Ht 6' 1.5" (1.867 m)   Wt 183 lb (83 kg)   BMI 23.82 kg/m   Physical Exam Chronically ill appearing.   HEENT:  PERRL, EOMI, sclera white.  TMs pearly gray, throat without injection.  Left mandibular angle with non healing, somewhat ulcerated erythematous skin lesion. Neck:  Supple, No adenopathy, No thyromegaly Chest:  CTA CV:  RRR with normal S1 and S2, No S3, S4 or murmur.  No carotid bruits.  Carotid, radial and DP/PT pulses normal and equal. Abd:  S, NT, No HSM or mass + BS.  No fluid wave or shifting dullness.   LE:  No edema.  Feet with normal sensation to light touch.  Assessment & Plan    Alcoholic cirrhosis with history of ascites, gastropathy and esophageal varices:  Carvedilol 3.125 mg twice daily.  Discussed again importance of taking regularly to  decrease risk of esophageal bleed.  Also urged him to quit drinking alcohol all together.  Check  Lipid panel, CBC, CMP, particularly for transaminases.  Concern he continues to drink more than he is admitting today.  2.  Peripheral Neuropathy:  History of low folate.  Recheck along with B12 level, which has been low normal previously.  A1C, TSH.  B super complex 1 tab by mouth twice daily.  He is willing to try starting low with Gabapentin at 100 mg at bedtime and gradually titrate up as long as tolerated.  3.  Chronic Insomnia:  went over healthy sleep habits.  Needs to be addressed as has led to increased alcohol intake in past.  Gabapentin at bedtime may help with this.  4.  Skin lesion of left jaw/face area:  history of melanoma.  Concern more so for SCC or BCC with ulceration.  Urgent derm referral.  Encouraged him to get set up with orange card.    5.  HM:  Moderna bivalent vaccine  6.  Food insecurity:  Shared he is having difficulties affording food.  Referral to [facility], Leeann Must to find support.

## 2021-12-28 NOTE — Patient Instructions (Signed)
Warm bath and reading before bed. If unable to get to sleep in 20 minutes, get out of bed, go somewhere calm and read until you are sleepy, then back to bed. If awaken and unable to get back to sleep in 20 minutes--read as above until sleepy. Physical activity with a friend every day--gradually work up to 1 hour.  Do not exercise close to bedtime. No napping  

## 2021-12-29 LAB — COMPREHENSIVE METABOLIC PANEL
ALT: 39 IU/L (ref 0–44)
AST: 90 IU/L — ABNORMAL HIGH (ref 0–40)
Albumin/Globulin Ratio: 1.4 (ref 1.2–2.2)
Albumin: 4 g/dL (ref 4.0–5.0)
Alkaline Phosphatase: 112 IU/L (ref 44–121)
BUN/Creatinine Ratio: 12 (ref 9–20)
BUN: 7 mg/dL (ref 6–24)
Bilirubin Total: 1.1 mg/dL (ref 0.0–1.2)
CO2: 26 mmol/L (ref 20–29)
Calcium: 9.5 mg/dL (ref 8.7–10.2)
Chloride: 102 mmol/L (ref 96–106)
Creatinine, Ser: 0.57 mg/dL — ABNORMAL LOW (ref 0.76–1.27)
Globulin, Total: 2.8 g/dL (ref 1.5–4.5)
Glucose: 72 mg/dL (ref 70–99)
Potassium: 4.1 mmol/L (ref 3.5–5.2)
Sodium: 140 mmol/L (ref 134–144)
Total Protein: 6.8 g/dL (ref 6.0–8.5)
eGFR: 119 mL/min/{1.73_m2} (ref >59)

## 2021-12-29 LAB — CBC WITH DIFFERENTIAL/PLATELET
Basophils Absolute: 0.1 10*3/uL (ref 0.0–0.2)
Basos: 1 %
EOS (ABSOLUTE): 0.1 10*3/uL (ref 0.0–0.4)
Eos: 2 %
Hematocrit: 41 % (ref 37.5–51.0)
Hemoglobin: 14.1 g/dL (ref 13.0–17.7)
Immature Grans (Abs): 0 10*3/uL (ref 0.0–0.1)
Immature Granulocytes: 0 %
Lymphocytes Absolute: 0.9 10*3/uL (ref 0.7–3.1)
Lymphs: 15 %
MCH: 33.4 pg — ABNORMAL HIGH (ref 26.6–33.0)
MCHC: 34.4 g/dL (ref 31.5–35.7)
MCV: 97 fL (ref 79–97)
Monocytes Absolute: 0.9 10*3/uL (ref 0.1–0.9)
Monocytes: 15 %
Neutrophils Absolute: 4 10*3/uL (ref 1.4–7.0)
Neutrophils: 67 %
Platelets: 100 10*3/uL — CL (ref 150–450)
RBC: 4.22 x10E6/uL (ref 4.14–5.80)
RDW: 12 % (ref 11.6–15.4)
WBC: 5.9 10*3/uL (ref 3.4–10.8)

## 2021-12-29 LAB — LIPID PANEL W/O CHOL/HDL RATIO
Cholesterol, Total: 123 mg/dL (ref 100–199)
HDL: 29 mg/dL — ABNORMAL LOW (ref >39)
LDL Chol Calc (NIH): 81 mg/dL (ref 0–99)
Triglycerides: 62 mg/dL (ref 0–149)
VLDL Cholesterol Cal: 13 mg/dL (ref 5–40)

## 2021-12-29 LAB — TSH: TSH: 2.15 u[IU]/mL (ref 0.450–4.500)

## 2021-12-29 LAB — HGB A1C W/O EAG: Hgb A1c MFr Bld: 4.9 % (ref 4.8–5.6)

## 2021-12-29 LAB — VITAMIN B12: Vitamin B-12: 1326 pg/mL — ABNORMAL HIGH (ref 232–1245)

## 2021-12-29 LAB — FOLATE: Folate: >20 ng/mL (ref >3.0)

## 2022-03-11 ENCOUNTER — Encounter (HOSPITAL_COMMUNITY): Payer: Self-pay | Admitting: *Deleted

## 2022-03-11 ENCOUNTER — Ambulatory Visit (HOSPITAL_COMMUNITY)
Admission: EM | Admit: 2022-03-11 | Discharge: 2022-03-11 | Disposition: A | Discharge status: Home / Self Care | Payer: Self-pay | Attending: Family Medicine | Admitting: Family Medicine

## 2022-03-11 DIAGNOSIS — L03115 Cellulitis of right lower limb: Secondary | ICD-10-CM | POA: Insufficient documentation

## 2022-03-11 DIAGNOSIS — D691 Qualitative platelet defects: Secondary | ICD-10-CM | POA: Insufficient documentation

## 2022-03-11 DIAGNOSIS — T148XXA Other injury of unspecified body region, initial encounter: Secondary | ICD-10-CM | POA: Insufficient documentation

## 2022-03-11 DIAGNOSIS — R609 Edema, unspecified: Secondary | ICD-10-CM | POA: Insufficient documentation

## 2022-03-11 LAB — COMPREHENSIVE METABOLIC PANEL
ALT: 37 U/L (ref 0–44)
AST: 138 U/L — ABNORMAL HIGH (ref 15–41)
Albumin: 3.2 g/dL — ABNORMAL LOW (ref 3.5–5.0)
Alkaline Phosphatase: 92 U/L (ref 38–126)
Anion gap: 11 (ref 5–15)
BUN: <5 mg/dL — ABNORMAL LOW (ref 6–20)
CO2: 27 mmol/L (ref 22–32)
Calcium: 9.1 mg/dL (ref 8.9–10.3)
Chloride: 102 mmol/L (ref 98–111)
Creatinine, Ser: 0.58 mg/dL — ABNORMAL LOW (ref 0.61–1.24)
GFR, Estimated: >60 mL/min (ref >60)
Glucose, Bld: 97 mg/dL (ref 70–99)
Potassium: 3 mmol/L — ABNORMAL LOW (ref 3.5–5.1)
Sodium: 140 mmol/L (ref 135–145)
Total Bilirubin: 2.2 mg/dL — ABNORMAL HIGH (ref 0.3–1.2)
Total Protein: 7.5 g/dL (ref 6.5–8.1)

## 2022-03-11 LAB — CBC WITH DIFFERENTIAL/PLATELET
Abs Immature Granulocytes: 0.02 10*3/uL (ref 0.00–0.07)
Basophils Absolute: 0.1 10*3/uL (ref 0.0–0.1)
Basophils Relative: 1 %
Eosinophils Absolute: 0.2 10*3/uL (ref 0.0–0.5)
Eosinophils Relative: 3 %
HCT: 38 % — ABNORMAL LOW (ref 39.0–52.0)
Hemoglobin: 12.8 g/dL — ABNORMAL LOW (ref 13.0–17.0)
Immature Granulocytes: 0 %
Lymphocytes Relative: 21 %
Lymphs Abs: 1 10*3/uL (ref 0.7–4.0)
MCH: 34.5 pg — ABNORMAL HIGH (ref 26.0–34.0)
MCHC: 33.7 g/dL (ref 30.0–36.0)
MCV: 102.4 fL — ABNORMAL HIGH (ref 80.0–100.0)
Monocytes Absolute: 0.6 10*3/uL (ref 0.1–1.0)
Monocytes Relative: 12 %
Neutro Abs: 3 10*3/uL (ref 1.7–7.7)
Neutrophils Relative %: 63 %
Platelets: 85 10*3/uL — ABNORMAL LOW (ref 150–400)
RBC: 3.71 MIL/uL — ABNORMAL LOW (ref 4.22–5.81)
RDW: 15.3 % (ref 11.5–15.5)
WBC: 4.9 10*3/uL (ref 4.0–10.5)
nRBC: 0 % (ref 0.0–0.2)

## 2022-03-11 MED ORDER — CEPHALEXIN 500 MG PO CAPS: 500.0000 mg | ORAL_CAPSULE | Freq: Three times a day (TID) | ORAL | 0 refills | Status: AC

## 2022-03-11 MED ORDER — FUROSEMIDE 20 MG PO TABS: 20.0000 mg | ORAL_TABLET | Freq: Every day | ORAL | 0 refills | Status: DC

## 2022-03-11 NOTE — Discharge Instructions (Addendum)
You were seen today for swelling of your legs.  I have sent out an antibiotic to take three times/day x 10 days, as well as a water pill (furosemide) to take daily.   Please keep your legs elevated and drink plenty of fluids.  I have also checked lab work for your today.  This will be resulted in the next 24 hrs.  I am concerned about your platelets.  Please follow up with your primary care provider as well for this problem, and to discuss your lab work further.

## 2022-03-11 NOTE — ED Provider Notes (Signed)
[facility]    CSN: [id number] Arrival date & time: 03/11/22  1030      History   Chief Complaint Chief Complaint  Patient presents with   Leg Swelling    HPI Stephen Miranda is a 51 y.o. male.   About 10 days ago he tripped and fell forward, landed on both knees on a stone path.  Hurt the right worse than the left.  He started to notice swelling of the right foot, ankle about 3 days ago.  The swelling has worsened.   He noted redness to the right foot/ankle several days after the swelling started.  Feels warms;  He is eating/drinking well.  Not sleeping well.  He is urinating well.  Some bruising is not healing as well as it should.   He has h/o ETOH abuse.  Last night he drank was last night, 1 beer.  Med list includes lasix and spironolactone, although he denies ever taking those.   He has also noted some easy bruising.  Review of recent labs shows low platelet count.   Past Medical History:  Diagnosis Date   Chronic insomnia 05/23/2016   Has stated in past since age 34 yo, but worse in recent years with restless legs.   Elevated liver enzymes 04/24/2015   04/18/2015:  AST:  115     ALT:  90. 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 Sarajane Jews, [facility]   Restless legs 05/23/2016   Seborrheic dermatitis 05/23/2016    Patient Active Problem List   Diagnosis Date Noted   Skin lesion of cheek 31/54/0086   Umbilical hernia without obstruction and without gangrene 02/15/2020   Edema 76/19/5093   Alcoholic cirrhosis of liver with ascites ([place]) 26/71/2458   Folic acid deficiency 09/98/3382   Upper GI bleed 05/20/2019   Abnormal liver function test    Acute post-hemorrhagic anemia    Alcohol abuse    Acute upper GI bleeding 05/19/2019   Macrocytic anemia 05/19/2019   Thrombocytopenia ([place]) 05/19/2019   Coagulopathy ([place]) 05/19/2019   History of alcohol abuse 05/19/2019   Tobacco use  05/23/2016   Seborrheic dermatitis 05/23/2016   Restless legs 05/23/2016   Chronic insomnia 05/23/2016   Malignant melanoma of back ([place]) 01/02/2016   Elevated liver enzymes 04/24/2015    Past Surgical History:  Procedure Laterality Date   ESOPHAGOGASTRODUODENOSCOPY (EGD) WITH PROPOFOL N/A 05/20/2019   Procedure: ESOPHAGOGASTRODUODENOSCOPY (EGD) WITH PROPOFOL;  Surgeon: Irving Copas., MD;  Location: [facility];  Service: Gastroenterology;  Laterality: N/A;   Excision Malignant Melanoma Left 02/07/2015   Left upper back       Home Medications    Prior to Admission medications   Medication Sig Start Date End Date Taking? Authorizing Provider  B Complex-Folic Acid (SUPER B COMPLEX MAXI) TABS 1 tab by mouth twice daily. 12/28/21  Yes Mack Hook, MD  Multiple Vitamin (MULTIVITAMIN WITH MINERALS) TABS tablet Take 1 tablet by mouth daily. 05/23/19  Yes Debbe Odea, MD  carvedilol (COREG) 3.125 MG tablet Take 1 tablet (3.125 mg total) by mouth 2 (two) times daily with a meal. 12/28/21   Mack Hook, MD  Cyanocobalamin (VITAMIN B-12) 1000 MCG SUBL Place under the tongue. 1 daily Patient not taking: Reported on 12/28/2021    [provider]  ferrous gluconate (FERGON) 324 MG tablet 1 tab by mouth twice daily. Patient not taking: Reported on 11/09/2019 06/25/19  Mack Hook, MD  folic acid (FOLVITE) 1 MG tablet Take 1 tablet (1 mg total) by mouth daily. Patient not taking: Reported on 12/28/2021 05/23/19   Debbe Odea, MD  furosemide (LASIX) 40 MG tablet 1 tab by mouth daily as needed for increased leg and abdominal swelling Patient not taking: Reported on 12/28/2021 08/11/19   Mack Hook, MD  gabapentin (NEURONTIN) 100 MG capsule 1 cap by mouth at bedtime 12/28/21   Mack Hook, MD  KETOCONAZOLE, TOPICAL, 1 % SHAM Apply thickened suds to scalp and face and wash off at end of shower daily. Patient not taking: Reported on 12/28/2021 11/09/19    Mack Hook, MD  pantoprazole (PROTONIX) 40 MG tablet 1 tab by mouth daily on empty stomach Patient not taking: Reported on 12/28/2021 08/11/19   Mack Hook, MD  spironolactone (ALDACTONE) 50 MG tablet 1/2 tab by mouth in the morning daily Patient not taking: Reported on 12/28/2021 11/09/19   Mack Hook, MD  thiamine 100 MG tablet Take 1 tablet (100 mg total) by mouth daily. Patient not taking: Reported on 12/28/2021 05/23/19   Debbe Odea, MD  traZODone (DESYREL) 50 MG tablet 1 tab by mouth at bedtime daily Patient not taking: Reported on 11/09/2019 08/11/19   Mack Hook, MD    Family History Family History  Problem Relation Age of Onset   Fibromyalgia Mother     Social History Social History   Tobacco Use   Smoking status: Every Day    Packs/day: 0.50    Years: 20.00    Total pack years: 10.00    Types: Cigarettes   Smokeless tobacco: Never   Tobacco comments:    prescribed Chantix in past, but did not take  Vaping Use   Vaping Use: Never used  Substance Use Topics   Alcohol use: Yes    Alcohol/week: 4.0 standard drinks of alcohol    Types: 2 Cans of beer, 2 Shots of liquor per week    Comment: Nightly   Drug use: No     Allergies   Patient has no known allergies.   Review of Systems Review of Systems  Constitutional: Negative.   HENT: Negative.    Respiratory: Negative.    Cardiovascular:  Positive for leg swelling.  Gastrointestinal: Negative.   Genitourinary: Negative.   Skin:  Positive for color change and wound.  Neurological: Negative.   Hematological: Negative.      Physical Exam Triage Vital Signs ED Triage Vitals  Enc Vitals Group     BP 03/11/22 1109 124/74     Pulse Rate 03/11/22 1109 88     Resp 03/11/22 1109 18     Temp 03/11/22 1109 (!) 97.4 F (36.3 C)     Temp Source 03/11/22 1109 Oral     SpO2 03/11/22 1109 96 %     Weight --      Height --      Head Circumference --      Peak Flow --      Pain Score  03/11/22 1106 6     Pain Loc --      Pain Edu? --      Excl. in [place]? --    No data found.  Updated Vital Signs BP 124/74 (BP Location: Left Arm)   Pulse 88   Temp (!) 97.4 F (36.3 C) (Oral)   Resp 18   SpO2 96%   Visual Acuity Right Eye Distance:   Left Eye Distance:   Bilateral Distance:  Right Eye Near:   Left Eye Near:    Bilateral Near:     Physical Exam Constitutional:      Appearance: Normal appearance.  Cardiovascular:     Rate and Rhythm: Normal rate and regular rhythm.  Pulmonary:     Effort: Pulmonary effort is normal.     Breath sounds: Normal breath sounds.  Musculoskeletal:     Cervical back: Normal range of motion.  Skin:    Comments: Healing abrasion to the right knee;  no infection or swelling noted;  The right foot/ankle lower leg is swollen, tender, erythematous, warm to the touch.  No open sores or lesions.  The left foot and ankle is also swollen;  slight erythema to the left medial ankle/lower leg.   Neurological:     General: No focal deficit present.     Mental Status: He is alert.  Psychiatric:        Mood and Affect: Mood normal.      UC Treatments / Results  Labs (all labs ordered are listed, but only abnormal results are displayed) Labs Reviewed - No data to display  EKG   Radiology No results found.  Procedures Procedures (including critical care time)  Medications Ordered in UC Medications - No data to display  Initial Impression / Assessment and Plan / UC Course  I have reviewed the triage vital signs and the nursing notes.  Pertinent labs & imaging results that were available during my care of the patient were reviewed by me and considered in my medical decision making (see chart for details).  Patient seen for LE swelling and redness after a fall;  I don't think this is related directly to the fall as the knee overall was not swollen and overall looks good today.  Concern for cellulitis.   He states he was never  on the lasix/spironolactone.  Recently saw pcp in June and did not start this.  Labs done at that time show elevated ast  (known ETOH abuse, last drink yesterday but states he has cut back on this) and low platelet.  He has not followed up with his pcp in regards to this.  This is of concern today.  I have repeated lab work today.   Started keflex and lasix.  Advised him to follow up with his pcp in regards to this visit and lab work.  He should go to the ER if not improving.     Final Clinical Impressions(s) / UC Diagnoses   Final diagnoses:  Peripheral edema  Cellulitis of right lower extremity  Platelet disorder ([place])  Bruising     Discharge Instructions      You were seen today for swelling of your legs.  I have sent out an antibiotic to take three times/day x 10 days, as well as a water pill (furosemide) to take daily.   Please keep your legs elevated and drink plenty of fluids.  I have also checked lab work for your today.  This will be resulted in the next 24 hrs.  I am concerned about your platelets.  Please follow up with your primary care provider as well for this problem, and to discuss your lab work further.     ED Prescriptions     Medication Sig Dispense Auth. Provider   furosemide (LASIX) 20 MG tablet Take 1 tablet (20 mg total) by mouth daily. 10 tablet Orin Eberwein, MD   cephALEXin (KEFLEX) 500 MG capsule Take 1 capsule (500 mg total)  by mouth 3 (three) times daily for 10 days. 30 capsule Rondel Oh, MD      PDMP not reviewed this encounter.   Rondel Oh, MD 03/11/22 1141

## 2022-03-11 NOTE — ED Triage Notes (Signed)
Patient states that he fell 10 day ago and since bottom his legs have been swollen. He states that pain is mostly in ankle and feet.

## 2022-03-18 ENCOUNTER — Encounter: Payer: Self-pay | Admitting: Internal Medicine

## 2022-03-18 ENCOUNTER — Ambulatory Visit: Payer: Self-pay | Admitting: Internal Medicine

## 2022-03-18 VITALS — BP 110/60 | HR 80 | Resp 16 | Ht 73.5 in | Wt 184.0 lb

## 2022-03-18 DIAGNOSIS — T148XXA Other injury of unspecified body region, initial encounter: Secondary | ICD-10-CM

## 2022-03-18 DIAGNOSIS — F1010 Alcohol abuse, uncomplicated: Secondary | ICD-10-CM

## 2022-03-18 DIAGNOSIS — D696 Thrombocytopenia, unspecified: Secondary | ICD-10-CM

## 2022-03-18 DIAGNOSIS — G2581 Restless legs syndrome: Secondary | ICD-10-CM

## 2022-03-18 DIAGNOSIS — D539 Nutritional anemia, unspecified: Secondary | ICD-10-CM

## 2022-03-18 DIAGNOSIS — G609 Hereditary and idiopathic neuropathy, unspecified: Secondary | ICD-10-CM

## 2022-03-18 DIAGNOSIS — F32A Depression, unspecified: Secondary | ICD-10-CM

## 2022-03-18 DIAGNOSIS — R7989 Other specified abnormal findings of blood chemistry: Secondary | ICD-10-CM

## 2022-03-18 DIAGNOSIS — E876 Hypokalemia: Secondary | ICD-10-CM

## 2022-03-18 DIAGNOSIS — Z23 Encounter for immunization: Secondary | ICD-10-CM

## 2022-03-18 DIAGNOSIS — F5104 Psychophysiologic insomnia: Secondary | ICD-10-CM

## 2022-03-18 DIAGNOSIS — K7031 Alcoholic cirrhosis of liver with ascites: Secondary | ICD-10-CM

## 2022-03-18 NOTE — Progress Notes (Signed)
Subjective:    Patient ID: Stephen Miranda, male   DOB: [birth year], 51 y.o.   MRN: [id number]   HPI   Here for follow up after ED visit 03/11/2022.  States 10 days prior to the ED visit, he was out with his dog and tripped, fell on right anterior knee and rolled up against steps.  Fell on the stone walkway to steps.  Leg swelled and also had scraped up area on anterior knee.  About 7 days later, noted redness with gradually increasing swelling of right leg and foot.  States ankle and foot looked like a soccer ball.   Was diagnosed with cellulitis of the leg and Rx for Cephalexin 500 mg 3 times daily for 10 days and Lasix.   He was not noted to have ascites He does state he had mild swelling of left lower extrem and mild injury to anterior knee there with the fall.  That has resolved since ED visit and treatment above.    Labs in ED showed low platelet count, increasing AST  Discussed his history of acoholic cirrhosis and labs consistent with that. Discussed also not immune to Hep A or B today.   Not taking Carvedilol,   just hasn't picked up. He gives me both verbal consent to speak with his mom about his health issues and has her listed in his HIPPA forms to be able to speak with her as well.  We renewed that HIPAA form today.  2.  Peripheral neuropathy:  felt worse on the gabapentin with just 100 mg--felt spacy and fatigued, so never titrated up.  Not really having symptoms of the RLS.  His folate and B12 levels were supranormal.   3.  Low HDL:  discussed good fat containing foods and regular physical exercise.   Medicatiions:   Not taking any of previously prescribed meds, including Carvedilol.   No Known Allergies   Review of Systems    Objective:   BP 110/60 (BP Location: Right Arm, Patient Position: Sitting, Cuff Size: Normal)   Pulse 80   Resp 16   Ht 6' 1.5" (1.867 m)   Wt 184 lb (83.5 kg)   BMI 23.95 kg/m   Physical Exam NAD Lungs:  CTA CV:  RRR without murmur or  rub.  Radial and DP pulses normal and equal Abd:  S, NT, No HSM or mass, + BS.  No shifting dullness LE:  Mild pinkening and mild to moderate edema of skin of R LE at ankle/pretib area.  Some bruising.      Assessment & Plan   Bruising/cellulitis:  PT/INR, PTT.  Appears to be healing.  2.  Cirrhosis with esophageal varices and increasing AST:  encouraged him to go pick up new Rx of Carvedilol.  Very concerned he is continuing to drink.  Spoke with his mother, a previous Scientist, research (physical sciences).   She states they are very aware of his condition.  She is not certain if he is currently drinking, but have not seen anything to support that he is.   He has denied alcohol intake in the past when he has been drinking heavily.  Family has basically been supporting him.  Discussed need to pick up his medication and get started. Not immune to Hep A or B.  Vaccines initiated today.    3.  Peripheral neuropathy vs restless leg syndrome:  Hold Gabapentin.  Did not do well with it again.  Not B12 or folate deficient.  TSH was okay  as well.  4.  Macrocytic anemia and thrombocytopenia:  Likely due to cirrhosis/splenic congestion.  Again, B vitamin levels and TSH fine.  Repeat CBC.  Iron studies.    5.  Insomnia:  Again, encouraged getting outside more once healed.    6.  Hypokalemia:  prior to lasix usage.  Banana or orange or avocado daily.  7.  Depression:  suspect a factor in not following through with care and continue to worry about ongoing alcohol intake.  Warm hand off to Gala Romney, MSW.

## 2022-03-22 LAB — CBC WITH DIFFERENTIAL/PLATELET
Basophils Absolute: 0.1 10*3/uL (ref 0.0–0.2)
Basos: 2 %
EOS (ABSOLUTE): 0.2 10*3/uL (ref 0.0–0.4)
Eos: 3 %
Hematocrit: 36.3 % — ABNORMAL LOW (ref 37.5–51.0)
Hemoglobin: 12.7 g/dL — ABNORMAL LOW (ref 13.0–17.7)
Immature Grans (Abs): 0 10*3/uL (ref 0.0–0.1)
Immature Granulocytes: 0 %
Lymphocytes Absolute: 0.8 10*3/uL (ref 0.7–3.1)
Lymphs: 15 %
MCH: 34 pg — ABNORMAL HIGH (ref 26.6–33.0)
MCHC: 35 g/dL (ref 31.5–35.7)
MCV: 97 fL (ref 79–97)
Monocytes Absolute: 0.7 10*3/uL (ref 0.1–0.9)
Monocytes: 12 %
Neutrophils Absolute: 3.8 10*3/uL (ref 1.4–7.0)
Neutrophils: 68 %
Platelets: 71 10*3/uL — CL (ref 150–450)
RBC: 3.74 x10E6/uL — ABNORMAL LOW (ref 4.14–5.80)
RDW: 12.9 % (ref 11.6–15.4)
WBC: 5.5 10*3/uL (ref 3.4–10.8)

## 2022-03-22 LAB — COMPREHENSIVE METABOLIC PANEL
ALT: 35 IU/L (ref 0–44)
AST: 147 IU/L — ABNORMAL HIGH (ref 0–40)
Albumin/Globulin Ratio: 0.9 — ABNORMAL LOW (ref 1.2–2.2)
Albumin: 3.6 g/dL — ABNORMAL LOW (ref 4.1–5.1)
Alkaline Phosphatase: 127 IU/L — ABNORMAL HIGH (ref 44–121)
BUN/Creatinine Ratio: 9 (ref 9–20)
BUN: 5 mg/dL — ABNORMAL LOW (ref 6–24)
Bilirubin Total: 1.6 mg/dL — ABNORMAL HIGH (ref 0.0–1.2)
CO2: 26 mmol/L (ref 20–29)
Calcium: 8.8 mg/dL (ref 8.7–10.2)
Chloride: 104 mmol/L (ref 96–106)
Creatinine, Ser: 0.56 mg/dL — ABNORMAL LOW (ref 0.76–1.27)
Globulin, Total: 4 g/dL (ref 1.5–4.5)
Glucose: 93 mg/dL (ref 70–99)
Potassium: 3.5 mmol/L (ref 3.5–5.2)
Sodium: 145 mmol/L — ABNORMAL HIGH (ref 134–144)
Total Protein: 7.6 g/dL (ref 6.0–8.5)
eGFR: 120 mL/min/{1.73_m2} (ref >59)

## 2022-03-22 LAB — IRON AND TIBC
Iron Saturation: 54 % (ref 15–55)
Iron: 140 ug/dL (ref 38–169)
Total Iron Binding Capacity: 258 ug/dL (ref 250–450)
UIBC: 118 ug/dL (ref 111–343)

## 2022-03-22 LAB — PROTIME-INR
INR: 1.2 (ref 0.9–1.2)
Prothrombin Time: 13.3 s — ABNORMAL HIGH (ref 9.1–12.0)

## 2022-03-27 ENCOUNTER — Ambulatory Visit: Payer: Self-pay

## 2022-03-27 VITALS — BP 108/70 | HR 88

## 2022-03-27 DIAGNOSIS — Z0130 Encounter for examination of blood pressure without abnormal findings: Secondary | ICD-10-CM

## 2022-03-27 NOTE — Progress Notes (Signed)
Patient has been taking Carvedilol 3.'125mg'$  half a tab twice daily consistently.   After reporting to Dr Amil Amen, no changes to medications will be made.

## 2022-05-03 ENCOUNTER — Ambulatory Visit: Payer: Self-pay | Admitting: Internal Medicine

## 2022-05-20 ENCOUNTER — Ambulatory Visit: Payer: Self-pay | Admitting: Internal Medicine

## 2022-06-10 ENCOUNTER — Other Ambulatory Visit: Payer: Self-pay

## 2022-06-10 ENCOUNTER — Encounter (HOSPITAL_COMMUNITY): Payer: Self-pay | Admitting: Emergency Medicine

## 2022-06-10 ENCOUNTER — Ambulatory Visit (HOSPITAL_COMMUNITY)
Admission: EM | Admit: 2022-06-10 | Discharge: 2022-06-10 | Disposition: A | Discharge status: Home / Self Care | Payer: Self-pay | Attending: Internal Medicine | Admitting: Internal Medicine

## 2022-06-10 DIAGNOSIS — M7989 Other specified soft tissue disorders: Secondary | ICD-10-CM | POA: Insufficient documentation

## 2022-06-10 DIAGNOSIS — I872 Venous insufficiency (chronic) (peripheral): Secondary | ICD-10-CM | POA: Insufficient documentation

## 2022-06-10 LAB — COMPREHENSIVE METABOLIC PANEL
ALT: 21 U/L (ref 0–44)
AST: 59 U/L — ABNORMAL HIGH (ref 15–41)
Albumin: 3.4 g/dL — ABNORMAL LOW (ref 3.5–5.0)
Alkaline Phosphatase: 77 U/L (ref 38–126)
Anion gap: 10 (ref 5–15)
BUN: <5 mg/dL — ABNORMAL LOW (ref 6–20)
CO2: 23 mmol/L (ref 22–32)
Calcium: 8.9 mg/dL (ref 8.9–10.3)
Chloride: 103 mmol/L (ref 98–111)
Creatinine, Ser: 0.7 mg/dL (ref 0.61–1.24)
GFR, Estimated: >60 mL/min (ref >60)
Glucose, Bld: 89 mg/dL (ref 70–99)
Potassium: 3.4 mmol/L — ABNORMAL LOW (ref 3.5–5.1)
Sodium: 136 mmol/L (ref 135–145)
Total Bilirubin: 5.6 mg/dL — ABNORMAL HIGH (ref 0.3–1.2)
Total Protein: 8.2 g/dL — ABNORMAL HIGH (ref 6.5–8.1)

## 2022-06-10 LAB — CBC WITH DIFFERENTIAL/PLATELET
Abs Immature Granulocytes: 0.01 10*3/uL (ref 0.00–0.07)
Basophils Absolute: 0 10*3/uL (ref 0.0–0.1)
Basophils Relative: 1 %
Eosinophils Absolute: 0.1 10*3/uL (ref 0.0–0.5)
Eosinophils Relative: 3 %
HCT: 37 % — ABNORMAL LOW (ref 39.0–52.0)
Hemoglobin: 12.8 g/dL — ABNORMAL LOW (ref 13.0–17.0)
Immature Granulocytes: 0 %
Lymphocytes Relative: 22 %
Lymphs Abs: 0.9 10*3/uL (ref 0.7–4.0)
MCH: 35.2 pg — ABNORMAL HIGH (ref 26.0–34.0)
MCHC: 34.6 g/dL (ref 30.0–36.0)
MCV: 101.6 fL — ABNORMAL HIGH (ref 80.0–100.0)
Monocytes Absolute: 0.6 10*3/uL (ref 0.1–1.0)
Monocytes Relative: 14 %
Neutro Abs: 2.4 10*3/uL (ref 1.7–7.7)
Neutrophils Relative %: 60 %
Platelets: 39 10*3/uL — ABNORMAL LOW (ref 150–400)
RBC: 3.64 MIL/uL — ABNORMAL LOW (ref 4.22–5.81)
RDW: 14.1 % (ref 11.5–15.5)
Smear Review: DECREASED
WBC: 4 10*3/uL (ref 4.0–10.5)
nRBC: 0 % (ref 0.0–0.2)

## 2022-06-10 LAB — ETHANOL: Alcohol, Ethyl (B): <10 mg/dL (ref <10)

## 2022-06-10 LAB — BRAIN NATRIURETIC PEPTIDE: B Natriuretic Peptide: 61.5 pg/mL (ref 0.0–100.0)

## 2022-06-10 NOTE — ED Provider Notes (Signed)
[place]    CSN: [id number] Arrival date & time: 06/10/22  0944      History   Chief Complaint Chief Complaint  Patient presents with   Leg Swelling    HPI Stephen Miranda is a 51 y.o. male.   Patient presents urgent care for evaluation of bilateral leg swelling that started 3 weeks ago but worsened significantly over the last couple of days.  Patient states that he has been staying at his parents house while watching his parents dogs and has attempted to keep his feet up to allow the swelling to reduce but has not been able to do so as much as he would like.  He also reports a dry/flaky rash to the bilateral lower extremities that has been present for many months now without the swelling.  Had a couple episodes of nausea and vomiting 1 to 2 days ago and states he is feeling better now and is not currently nauseous.  No abdominal pain, fever/chills, recent exposure to allergens/irritants, recent exposure to lake or river water, recent long periods of travel/sitting, chest pain, shortness of breath, orthopnea, or urinary symptoms.  Denies history of heart problems.      Past Medical History:  Diagnosis Date   Chronic insomnia 05/23/2016   Has stated in past since age 30 yo, but worse in recent years with restless legs.   Elevated liver enzymes 04/24/2015   04/18/2015:  AST:  115     ALT:  90. Likely due to alcohol intake   Malignant melanoma of back ([place]) 02/07/2015   Left upper back:  Dr. Sarajane Jews, [facility]   Restless legs 05/23/2016   Seborrheic dermatitis 05/23/2016    Patient Active Problem List   Diagnosis Date Noted   Skin lesion of cheek 42/59/5638   Umbilical hernia without obstruction and without gangrene 02/15/2020   Edema 75/64/3329   Alcoholic cirrhosis of liver with ascites ([place]) 51/88/4166   Folic acid deficiency 01/19/1600   Upper GI bleed 05/20/2019   Abnormal liver function test    Acute post-hemorrhagic anemia    Alcohol  abuse    Acute upper GI bleeding 05/19/2019   Macrocytic anemia 05/19/2019   Thrombocytopenia ([place]) 05/19/2019   Coagulopathy ([place]) 05/19/2019   History of alcohol abuse 05/19/2019   Tobacco use 05/23/2016   Seborrheic dermatitis 05/23/2016   Restless legs 05/23/2016   Chronic insomnia 05/23/2016   Malignant melanoma of back ([place]) 01/02/2016   Elevated liver enzymes 04/24/2015    Past Surgical History:  Procedure Laterality Date   ESOPHAGOGASTRODUODENOSCOPY (EGD) WITH PROPOFOL N/A 05/20/2019   Procedure: ESOPHAGOGASTRODUODENOSCOPY (EGD) WITH PROPOFOL;  Surgeon: Irving Copas., MD;  Location: [place];  Service: Gastroenterology;  Laterality: N/A;   Excision Malignant Melanoma Left 02/07/2015   Left upper back       Home Medications    Prior to Admission medications   Medication Sig Start Date End Date Taking? Authorizing Provider  B Complex-Folic Acid (SUPER B COMPLEX MAXI) TABS 1 tab by mouth twice daily. Patient not taking: Reported on 03/18/2022 12/28/21   Mack Hook, MD  carvedilol (COREG) 3.125 MG tablet Take 1 tablet (3.125 mg total) by mouth 2 (two) times daily with a meal. Patient not taking: Reported on 03/18/2022 12/28/21   Mack Hook, MD  furosemide (LASIX) 20 MG tablet Take 1 tablet (20 mg total) by mouth daily. Patient not taking: Reported on 06/10/2022 03/11/22   Rondel Oh, MD  gabapentin (NEURONTIN) 100 MG  capsule 1 cap by mouth at bedtime Patient not taking: Reported on 03/18/2022 12/28/21   Mack Hook, MD  Multiple Vitamin (MULTIVITAMIN WITH MINERALS) TABS tablet Take 1 tablet by mouth daily. 05/23/19   Debbe Odea, MD    Family History Family History  Problem Relation Age of Onset   Fibromyalgia Mother     Social History Social History   Tobacco Use   Smoking status: Every Day    Packs/day: 0.50    Years: 20.00    Total pack years: 10.00    Types: Cigarettes   Smokeless tobacco: Never   Tobacco comments:     prescribed Chantix in past, but did not take  Vaping Use   Vaping Use: Never used  Substance Use Topics   Alcohol use: Not Currently    Alcohol/week: 4.0 standard drinks of alcohol    Types: 2 Cans of beer, 2 Shots of liquor per week    Comment: Nightly   Drug use: No     Allergies   Patient has no known allergies.   Review of Systems Review of Systems Per HPI  Physical Exam Triage Vital Signs ED Triage Vitals  Enc Vitals Group     BP 06/10/22 1107 127/74     Pulse Rate 06/10/22 1107 93     Resp 06/10/22 1107 (!) 22     Temp 06/10/22 1107 99.5 F (37.5 C)     Temp Source 06/10/22 1107 Oral     SpO2 06/10/22 1107 97 %     Weight --      Height --      Head Circumference --      Peak Flow --      Pain Score 06/10/22 1105 6     Pain Loc --      Pain Edu? --      Excl. in [place]? --    No data found.  Updated Vital Signs BP 127/74 (BP Location: Right Arm)   Pulse 93   Temp 99.5 F (37.5 C) (Oral)   Resp (!) 22   SpO2 97%   Visual Acuity Right Eye Distance:   Left Eye Distance:   Bilateral Distance:    Right Eye Near:   Left Eye Near:    Bilateral Near:     Physical Exam Vitals and nursing note reviewed.  Constitutional:      Appearance: He is not ill-appearing or toxic-appearing.  HENT:     Head: Normocephalic and atraumatic.     Right Ear: Hearing and external ear normal.     Left Ear: Hearing and external ear normal.     Nose: Nose normal.     Mouth/Throat:     Lips: Pink.  Eyes:     General: Lids are normal. Vision grossly intact. Gaze aligned appropriately.     Extraocular Movements: Extraocular movements intact.     Conjunctiva/sclera: Conjunctivae normal.  Cardiovascular:     Rate and Rhythm: Normal rate and regular rhythm.     Heart sounds: Normal heart sounds, S1 normal and S2 normal.  Pulmonary:     Effort: Pulmonary effort is normal. No respiratory distress.     Breath sounds: Normal breath sounds and air entry.  Musculoskeletal:      Cervical back: Neck supple.     Right lower leg: Edema present.     Left lower leg: Edema present.  Skin:    General: Skin is warm and dry.     Capillary Refill: Capillary refill  takes less than 2 seconds.     Findings: No rash.     Comments: Bilateral lower extremity +1 pitting edema, erythema, and tenderness to palpation.  Erythematous and scaly rash is consistent with chronic stasis dermatitis.  Less than 3 capillary refill with +2 dorsalis pedis pulses bilaterally.  Strength and sensation intact to bilateral lower extremities.  See images below for further detail.  Negative Homan signs bilaterally without calf tenderness.  Neurological:     General: No focal deficit present.     Mental Status: He is alert and oriented to person, place, and time. Mental status is at baseline.     Cranial Nerves: No dysarthria or facial asymmetry.  Psychiatric:        Mood and Affect: Mood normal.        Speech: Speech normal.        Behavior: Behavior normal.        Thought Content: Thought content normal.        Judgment: Judgment normal.          UC Treatments / Results  Labs (all labs ordered are listed, but only abnormal results are displayed)   EKG   Radiology No results found.  Procedures Procedures (including critical care time)  Medications Ordered in UC Medications - No data to display  Initial Impression / Assessment and Plan / UC Course  I have reviewed the triage vital signs and the nursing notes.  Pertinent labs & imaging results that were available during my care of the patient were reviewed by me and considered in my medical decision making (see chart for details).   1.  Leg swelling, venous stasis dermatitis of both lower extremities Blood work pending to evaluate blood levels, electrolytes, and BNP.  Patient to continue to drink Pedialyte and water to stay hydrated due to recent vomiting illness.  He does not appear to be clinically dehydrated today and is nontoxic  in appearance with hemodynamically stable vital signs.  Ambulates normally without antalgic gait.  May use Tylenol as needed for any pain that he may experience to the legs.  Deferred furosemide prescription today due to unknown potassium status given recent vomiting.  Edema to the bilateral lower extremities is stable at this time and will likely improve with compression stockings, elevation, and rest.  Low suspicion for DVT or cellulitis etiology at this time as symptoms are bilateral and there is no warmth to the bilateral lower extremities/evidence of drainage or infection to the rash.  PCP follow-up as scheduled on December 20 recommended.  Deferred imaging based on stable cardiopulmonary exam and hemodynamically stable vital signs in clinic.  Strict ER and urgent care return precautions given.  Discussed physical exam and available lab work findings in clinic with patient.  Counseled patient regarding appropriate use of medications and potential side effects for all medications recommended or prescribed today. Discussed red flag signs and symptoms of worsening condition,when to call the PCP office, return to urgent care, and when to seek higher level of care in the emergency department. Patient verbalizes understanding and agreement with plan. All questions answered. Patient discharged in stable condition.    Final Clinical Impressions(s) / UC Diagnoses   Final diagnoses:  Leg swelling  Venous stasis dermatitis of both lower extremities     Discharge Instructions      We drew blood work in the clinic today and we will call you if any of your results are abnormal requiring further treatment.  Please continue  to drink Pedialyte and water to help stay hydrated due to recent vomiting illness.  You may take Tylenol as needed for any pain that you may experience.  Purchase and wear compression stockings to help with blood flow and reduce leg swelling.  Elevate your legs to reduce swelling as  well.  Follow-up with your primary care provider as scheduled in December.   ED Prescriptions   None    PDMP not reviewed this encounter.   Talbot Grumbling, [place] 06/12/22 1253

## 2022-06-10 NOTE — Discharge Instructions (Addendum)
We drew blood work in the clinic today and we will call you if any of your results are abnormal requiring further treatment.  Please continue to drink Pedialyte and water to help stay hydrated due to recent vomiting illness.  You may take Tylenol as needed for any pain that you may experience.  Purchase and wear compression stockings to help with blood flow and reduce leg swelling.  Elevate your legs to reduce swelling as well.  Follow-up with your primary care provider as scheduled in December.

## 2022-06-10 NOTE — ED Triage Notes (Signed)
Patient has had edema before and was seen here.  Reports bilateral lower extremity edema started 3 weeks ago.  Over the past few days has worsened significantly.  Has redness and pain.  Reports a callus to right foot.    Patient took patient off all medications 4 months ago  Unable to see pcp for another month

## 2022-07-02 ENCOUNTER — Emergency Department (HOSPITAL_COMMUNITY): Payer: Medicaid Other

## 2022-07-02 ENCOUNTER — Inpatient Hospital Stay (HOSPITAL_COMMUNITY)
Admission: EM | Admit: 2022-07-02 | Discharge: 2022-07-08 | DRG: 981 | Disposition: A | Discharge status: Home / Self Care | Payer: Medicaid Other | Attending: Internal Medicine | Admitting: Internal Medicine

## 2022-07-02 DIAGNOSIS — R579 Shock, unspecified: Secondary | ICD-10-CM

## 2022-07-02 DIAGNOSIS — K449 Diaphragmatic hernia without obstruction or gangrene: Secondary | ICD-10-CM | POA: Diagnosis present

## 2022-07-02 DIAGNOSIS — F5104 Psychophysiologic insomnia: Secondary | ICD-10-CM | POA: Diagnosis present

## 2022-07-02 DIAGNOSIS — Z85820 Personal history of malignant melanoma of skin: Secondary | ICD-10-CM

## 2022-07-02 DIAGNOSIS — E876 Hypokalemia: Secondary | ICD-10-CM | POA: Diagnosis present

## 2022-07-02 DIAGNOSIS — E440 Moderate protein-calorie malnutrition: Secondary | ICD-10-CM | POA: Diagnosis present

## 2022-07-02 DIAGNOSIS — D689 Coagulation defect, unspecified: Secondary | ICD-10-CM | POA: Diagnosis present

## 2022-07-02 DIAGNOSIS — K3189 Other diseases of stomach and duodenum: Secondary | ICD-10-CM | POA: Diagnosis present

## 2022-07-02 DIAGNOSIS — G2581 Restless legs syndrome: Secondary | ICD-10-CM | POA: Diagnosis present

## 2022-07-02 DIAGNOSIS — D62 Acute posthemorrhagic anemia: Secondary | ICD-10-CM | POA: Diagnosis present

## 2022-07-02 DIAGNOSIS — F17210 Nicotine dependence, cigarettes, uncomplicated: Secondary | ICD-10-CM | POA: Diagnosis present

## 2022-07-02 DIAGNOSIS — D6959 Other secondary thrombocytopenia: Secondary | ICD-10-CM | POA: Diagnosis present

## 2022-07-02 DIAGNOSIS — R338 Other retention of urine: Secondary | ICD-10-CM | POA: Diagnosis not present

## 2022-07-02 DIAGNOSIS — K766 Portal hypertension: Secondary | ICD-10-CM | POA: Diagnosis present

## 2022-07-02 DIAGNOSIS — K2270 Barrett's esophagus without dysplasia: Secondary | ICD-10-CM | POA: Diagnosis present

## 2022-07-02 DIAGNOSIS — R578 Other shock: Secondary | ICD-10-CM | POA: Diagnosis present

## 2022-07-02 DIAGNOSIS — I864 Gastric varices: Secondary | ICD-10-CM | POA: Diagnosis present

## 2022-07-02 DIAGNOSIS — I868 Varicose veins of other specified sites: Secondary | ICD-10-CM | POA: Diagnosis present

## 2022-07-02 DIAGNOSIS — N401 Enlarged prostate with lower urinary tract symptoms: Secondary | ICD-10-CM | POA: Diagnosis not present

## 2022-07-02 DIAGNOSIS — Z79899 Other long term (current) drug therapy: Secondary | ICD-10-CM

## 2022-07-02 DIAGNOSIS — L8990 Pressure ulcer of unspecified site, unspecified stage: Secondary | ICD-10-CM | POA: Insufficient documentation

## 2022-07-02 DIAGNOSIS — Z6825 Body mass index (BMI) 25.0-25.9, adult: Secondary | ICD-10-CM

## 2022-07-02 DIAGNOSIS — K7030 Alcoholic cirrhosis of liver without ascites: Principal | ICD-10-CM | POA: Diagnosis present

## 2022-07-02 DIAGNOSIS — G629 Polyneuropathy, unspecified: Secondary | ICD-10-CM | POA: Diagnosis present

## 2022-07-02 DIAGNOSIS — L89151 Pressure ulcer of sacral region, stage 1: Secondary | ICD-10-CM | POA: Diagnosis present

## 2022-07-02 DIAGNOSIS — I8511 Secondary esophageal varices with bleeding: Secondary | ICD-10-CM | POA: Diagnosis present

## 2022-07-02 DIAGNOSIS — J9690 Respiratory failure, unspecified, unspecified whether with hypoxia or hypercapnia: Secondary | ICD-10-CM | POA: Diagnosis present

## 2022-07-02 DIAGNOSIS — F1011 Alcohol abuse, in remission: Secondary | ICD-10-CM | POA: Diagnosis present

## 2022-07-02 DIAGNOSIS — K920 Hematemesis: Principal | ICD-10-CM

## 2022-07-02 DIAGNOSIS — I8500 Esophageal varices without bleeding: Secondary | ICD-10-CM

## 2022-07-02 LAB — COMPREHENSIVE METABOLIC PANEL
ALT: 17 U/L (ref 0–44)
AST: 50 U/L — ABNORMAL HIGH (ref 15–41)
Albumin: 2.1 g/dL — ABNORMAL LOW (ref 3.5–5.0)
Alkaline Phosphatase: 57 U/L (ref 38–126)
Anion gap: 13 (ref 5–15)
BUN: 23 mg/dL — ABNORMAL HIGH (ref 6–20)
CO2: 15 mmol/L — ABNORMAL LOW (ref 22–32)
Calcium: 7.8 mg/dL — ABNORMAL LOW (ref 8.9–10.3)
Chloride: 112 mmol/L — ABNORMAL HIGH (ref 98–111)
Creatinine, Ser: 0.78 mg/dL (ref 0.61–1.24)
GFR, Estimated: >60 mL/min (ref >60)
Glucose, Bld: 175 mg/dL — ABNORMAL HIGH (ref 70–99)
Potassium: 4.1 mmol/L (ref 3.5–5.1)
Sodium: 140 mmol/L (ref 135–145)
Total Bilirubin: 2.4 mg/dL — ABNORMAL HIGH (ref 0.3–1.2)
Total Protein: 5.1 g/dL — ABNORMAL LOW (ref 6.5–8.1)

## 2022-07-02 LAB — CBC WITH DIFFERENTIAL/PLATELET
Abs Immature Granulocytes: 0.15 10*3/uL — ABNORMAL HIGH (ref 0.00–0.07)
Basophils Absolute: 0.1 10*3/uL (ref 0.0–0.1)
Basophils Relative: 0 %
Eosinophils Absolute: 0 10*3/uL (ref 0.0–0.5)
Eosinophils Relative: 0 %
HCT: 17.7 % — ABNORMAL LOW (ref 39.0–52.0)
Hemoglobin: 5.9 g/dL — CL (ref 13.0–17.0)
Immature Granulocytes: 1 %
Lymphocytes Relative: 8 %
Lymphs Abs: 2.1 10*3/uL (ref 0.7–4.0)
MCH: 35.5 pg — ABNORMAL HIGH (ref 26.0–34.0)
MCHC: 33.3 g/dL (ref 30.0–36.0)
MCV: 106.6 fL — ABNORMAL HIGH (ref 80.0–100.0)
Monocytes Absolute: 1.2 10*3/uL — ABNORMAL HIGH (ref 0.1–1.0)
Monocytes Relative: 5 %
Neutro Abs: 21.8 10*3/uL — ABNORMAL HIGH (ref 1.7–7.7)
Neutrophils Relative %: 86 %
Platelets: 147 10*3/uL — ABNORMAL LOW (ref 150–400)
RBC: 1.66 MIL/uL — ABNORMAL LOW (ref 4.22–5.81)
RDW: 14.2 % (ref 11.5–15.5)
WBC: 25.3 10*3/uL — ABNORMAL HIGH (ref 4.0–10.5)
nRBC: 0 % (ref 0.0–0.2)

## 2022-07-02 LAB — I-STAT CHEM 8, ED
BUN: 25 mg/dL — ABNORMAL HIGH (ref 6–20)
Calcium, Ion: 1.06 mmol/L — ABNORMAL LOW (ref 1.15–1.40)
Chloride: 110 mmol/L (ref 98–111)
Creatinine, Ser: 0.6 mg/dL — ABNORMAL LOW (ref 0.61–1.24)
Glucose, Bld: 166 mg/dL — ABNORMAL HIGH (ref 70–99)
HCT: 22 % — ABNORMAL LOW (ref 39.0–52.0)
Hemoglobin: 7.5 g/dL — ABNORMAL LOW (ref 13.0–17.0)
Potassium: 4.1 mmol/L (ref 3.5–5.1)
Sodium: 142 mmol/L (ref 135–145)
TCO2: 16 mmol/L — ABNORMAL LOW (ref 22–32)

## 2022-07-02 LAB — LIPASE, BLOOD: Lipase: 28 U/L (ref 11–51)

## 2022-07-02 LAB — PROTIME-INR
INR: 1.9 — ABNORMAL HIGH (ref 0.8–1.2)
Prothrombin Time: 21.3 seconds — ABNORMAL HIGH (ref 11.4–15.2)

## 2022-07-02 LAB — LACTIC ACID, PLASMA: Lactic Acid, Venous: 8.5 mmol/L (ref 0.5–1.9)

## 2022-07-02 LAB — PREPARE RBC (CROSSMATCH)

## 2022-07-02 MED ORDER — SODIUM CHLORIDE 0.9% IV SOLUTION: Freq: Once | INTRAVENOUS | Status: DC

## 2022-07-02 MED ORDER — LACTATED RINGERS IV BOLUS
1000.0000 mL | Freq: Once | INTRAVENOUS | Status: AC
  Administered 2022-07-02: 1000 mL via INTRAVENOUS

## 2022-07-02 MED ORDER — SODIUM CHLORIDE 0.9 % IV SOLN
50.0000 ug/h | INTRAVENOUS | Status: AC
  Administered 2022-07-03 – 2022-07-07 (×11): 50 ug/h via INTRAVENOUS
  Filled 2022-07-02 (×12): qty 1

## 2022-07-02 MED ORDER — SODIUM CHLORIDE 0.9 % IV SOLN
1.0000 g | Freq: Once | INTRAVENOUS | Status: AC
  Administered 2022-07-02: 1 g via INTRAVENOUS
  Filled 2022-07-02: qty 10

## 2022-07-02 MED ORDER — PANTOPRAZOLE 80MG IVPB - SIMPLE MED
80.0000 mg | Freq: Once | INTRAVENOUS | Status: AC
  Administered 2022-07-02: 80 mg via INTRAVENOUS
  Filled 2022-07-02: qty 100

## 2022-07-02 MED ORDER — OCTREOTIDE LOAD VIA INFUSION
50.0000 ug | Freq: Once | INTRAVENOUS | Status: AC
  Administered 2022-07-03: 50 ug via INTRAVENOUS
  Filled 2022-07-02: qty 25

## 2022-07-02 MED ORDER — PANTOPRAZOLE SODIUM 40 MG IV SOLR: 40.0000 mg | Freq: Two times a day (BID) | INTRAVENOUS | Status: DC

## 2022-07-02 MED ORDER — FENTANYL CITRATE PF 50 MCG/ML IJ SOSY
50.0000 ug | PREFILLED_SYRINGE | Freq: Once | INTRAMUSCULAR | Status: AC
  Administered 2022-07-02: 50 ug via INTRAVENOUS
  Filled 2022-07-02: qty 1

## 2022-07-02 MED ORDER — PANTOPRAZOLE INFUSION (NEW) - SIMPLE MED
8.0000 mg/h | INTRAVENOUS | Status: DC
  Administered 2022-07-03 – 2022-07-05 (×6): 8 mg/h via INTRAVENOUS
  Filled 2022-07-02 (×7): qty 100

## 2022-07-02 MED ORDER — METOCLOPRAMIDE HCL 5 MG/ML IJ SOLN
10.0000 mg | Freq: Once | INTRAMUSCULAR | Status: AC
Start: 2022-07-02 — End: 2022-07-02
  Administered 2022-07-02: 10 mg via INTRAVENOUS
  Filled 2022-07-02: qty 2

## 2022-07-02 NOTE — ED Triage Notes (Signed)
Pt BIB EMS from home. Pt reports vomiting blood starting around noon today. Pt reports being too dizzy and weak + lightheaded to call for help. EMS reports about 1L of blood seen on floor around house.  Pt reports 5 episodes of vomiting blood and some in stool. Pt has hx of such - was on meds but recently taken off Pt pale on arrival  Pt orthostatic with EMS - BP as low as 80 systolic HR [phone]

## 2022-07-02 NOTE — ED Notes (Signed)
2 units of emergency release blood ordered Verbal by MD Doren Custard Verbal consent obtained by pt for blood transfusion   Labs obtained prior to blood transfusion

## 2022-07-02 NOTE — ED Notes (Signed)
First unit PRBC stopped

## 2022-07-02 NOTE — ED Notes (Signed)
MD Mesner notified of critical hgb

## 2022-07-02 NOTE — ED Notes (Signed)
1st unit emergency release PRBC started

## 2022-07-02 NOTE — ED Notes (Addendum)
MD Mesner aware of critical hgb

## 2022-07-02 NOTE — ED Notes (Signed)
Second unit PRBC started.

## 2022-07-02 NOTE — ED Notes (Signed)
Second unit PRBC completed

## 2022-07-03 ENCOUNTER — Encounter (HOSPITAL_COMMUNITY)
Admission: EM | Disposition: A | Discharge status: Home / Self Care | Payer: Self-pay | Source: Home / Self Care | Attending: Internal Medicine

## 2022-07-03 ENCOUNTER — Inpatient Hospital Stay (HOSPITAL_COMMUNITY): Payer: Medicaid Other

## 2022-07-03 ENCOUNTER — Encounter (HOSPITAL_COMMUNITY): Payer: Self-pay | Admitting: Pulmonary Disease

## 2022-07-03 ENCOUNTER — Other Ambulatory Visit: Payer: Self-pay

## 2022-07-03 DIAGNOSIS — K7030 Alcoholic cirrhosis of liver without ascites: Secondary | ICD-10-CM | POA: Diagnosis present

## 2022-07-03 DIAGNOSIS — K921 Melena: Secondary | ICD-10-CM

## 2022-07-03 DIAGNOSIS — K766 Portal hypertension: Secondary | ICD-10-CM

## 2022-07-03 DIAGNOSIS — N401 Enlarged prostate with lower urinary tract symptoms: Secondary | ICD-10-CM | POA: Diagnosis not present

## 2022-07-03 DIAGNOSIS — F5104 Psychophysiologic insomnia: Secondary | ICD-10-CM | POA: Diagnosis present

## 2022-07-03 DIAGNOSIS — I8500 Esophageal varices without bleeding: Secondary | ICD-10-CM

## 2022-07-03 DIAGNOSIS — F17200 Nicotine dependence, unspecified, uncomplicated: Secondary | ICD-10-CM | POA: Diagnosis not present

## 2022-07-03 DIAGNOSIS — D62 Acute posthemorrhagic anemia: Secondary | ICD-10-CM

## 2022-07-03 DIAGNOSIS — F17210 Nicotine dependence, cigarettes, uncomplicated: Secondary | ICD-10-CM | POA: Diagnosis present

## 2022-07-03 DIAGNOSIS — K922 Gastrointestinal hemorrhage, unspecified: Secondary | ICD-10-CM

## 2022-07-03 DIAGNOSIS — K449 Diaphragmatic hernia without obstruction or gangrene: Secondary | ICD-10-CM

## 2022-07-03 DIAGNOSIS — R579 Shock, unspecified: Secondary | ICD-10-CM | POA: Diagnosis not present

## 2022-07-03 DIAGNOSIS — F1011 Alcohol abuse, in remission: Secondary | ICD-10-CM | POA: Diagnosis present

## 2022-07-03 DIAGNOSIS — K2270 Barrett's esophagus without dysplasia: Secondary | ICD-10-CM | POA: Diagnosis present

## 2022-07-03 DIAGNOSIS — D689 Coagulation defect, unspecified: Secondary | ICD-10-CM | POA: Diagnosis present

## 2022-07-03 DIAGNOSIS — Z6825 Body mass index (BMI) 25.0-25.9, adult: Secondary | ICD-10-CM | POA: Diagnosis not present

## 2022-07-03 DIAGNOSIS — K3189 Other diseases of stomach and duodenum: Secondary | ICD-10-CM | POA: Diagnosis present

## 2022-07-03 DIAGNOSIS — E440 Moderate protein-calorie malnutrition: Secondary | ICD-10-CM | POA: Diagnosis present

## 2022-07-03 DIAGNOSIS — K920 Hematemesis: Secondary | ICD-10-CM | POA: Diagnosis present

## 2022-07-03 DIAGNOSIS — K2289 Other specified disease of esophagus: Secondary | ICD-10-CM

## 2022-07-03 DIAGNOSIS — I864 Gastric varices: Secondary | ICD-10-CM

## 2022-07-03 DIAGNOSIS — G2581 Restless legs syndrome: Secondary | ICD-10-CM | POA: Diagnosis present

## 2022-07-03 DIAGNOSIS — R578 Other shock: Secondary | ICD-10-CM | POA: Diagnosis present

## 2022-07-03 DIAGNOSIS — E876 Hypokalemia: Secondary | ICD-10-CM | POA: Diagnosis present

## 2022-07-03 DIAGNOSIS — R338 Other retention of urine: Secondary | ICD-10-CM | POA: Diagnosis not present

## 2022-07-03 DIAGNOSIS — G629 Polyneuropathy, unspecified: Secondary | ICD-10-CM | POA: Diagnosis present

## 2022-07-03 DIAGNOSIS — J9690 Respiratory failure, unspecified, unspecified whether with hypoxia or hypercapnia: Secondary | ICD-10-CM | POA: Diagnosis present

## 2022-07-03 DIAGNOSIS — D6959 Other secondary thrombocytopenia: Secondary | ICD-10-CM | POA: Diagnosis present

## 2022-07-03 DIAGNOSIS — I8511 Secondary esophageal varices with bleeding: Secondary | ICD-10-CM | POA: Diagnosis present

## 2022-07-03 DIAGNOSIS — I8510 Secondary esophageal varices without bleeding: Secondary | ICD-10-CM

## 2022-07-03 DIAGNOSIS — I868 Varicose veins of other specified sites: Secondary | ICD-10-CM | POA: Diagnosis present

## 2022-07-03 DIAGNOSIS — L89151 Pressure ulcer of sacral region, stage 1: Secondary | ICD-10-CM | POA: Diagnosis present

## 2022-07-03 DIAGNOSIS — D649 Anemia, unspecified: Secondary | ICD-10-CM | POA: Diagnosis not present

## 2022-07-03 HISTORY — PX: ESOPHAGOGASTRODUODENOSCOPY: SHX5428

## 2022-07-03 LAB — CBC
HCT: 28.5 % — ABNORMAL LOW (ref 39.0–52.0)
HCT: 29.1 % — ABNORMAL LOW (ref 39.0–52.0)
Hemoglobin: 10.5 g/dL — ABNORMAL LOW (ref 13.0–17.0)
Hemoglobin: 10.5 g/dL — ABNORMAL LOW (ref 13.0–17.0)
MCH: 32.1 pg (ref 26.0–34.0)
MCH: 31.8 pg (ref 26.0–34.0)
MCHC: 36.8 g/dL — ABNORMAL HIGH (ref 30.0–36.0)
MCHC: 36.1 g/dL — ABNORMAL HIGH (ref 30.0–36.0)
MCV: 87.2 fL (ref 80.0–100.0)
MCV: 88.2 fL (ref 80.0–100.0)
Platelets: 93 10*3/uL — ABNORMAL LOW (ref 150–400)
Platelets: 114 10*3/uL — ABNORMAL LOW (ref 150–400)
RBC: 3.27 MIL/uL — ABNORMAL LOW (ref 4.22–5.81)
RBC: 3.3 MIL/uL — ABNORMAL LOW (ref 4.22–5.81)
RDW: 17.2 % — ABNORMAL HIGH (ref 11.5–15.5)
RDW: 18.3 % — ABNORMAL HIGH (ref 11.5–15.5)
WBC: 24 10*3/uL — ABNORMAL HIGH (ref 4.0–10.5)
WBC: 20.3 10*3/uL — ABNORMAL HIGH (ref 4.0–10.5)
nRBC: 0 % (ref 0.0–0.2)
nRBC: 0.1 % (ref 0.0–0.2)

## 2022-07-03 LAB — CBC WITH DIFFERENTIAL/PLATELET
Abs Immature Granulocytes: 0.12 10*3/uL — ABNORMAL HIGH (ref 0.00–0.07)
Basophils Absolute: 0 10*3/uL (ref 0.0–0.1)
Basophils Relative: 0 %
Eosinophils Absolute: 0 10*3/uL (ref 0.0–0.5)
Eosinophils Relative: 0 %
HCT: 27.4 % — ABNORMAL LOW (ref 39.0–52.0)
Hemoglobin: 9.7 g/dL — ABNORMAL LOW (ref 13.0–17.0)
Immature Granulocytes: 1 %
Lymphocytes Relative: 5 %
Lymphs Abs: 0.9 10*3/uL (ref 0.7–4.0)
MCH: 32.6 pg (ref 26.0–34.0)
MCHC: 35.4 g/dL (ref 30.0–36.0)
MCV: 91.9 fL (ref 80.0–100.0)
Monocytes Absolute: 0.9 10*3/uL (ref 0.1–1.0)
Monocytes Relative: 5 %
Neutro Abs: 16.8 10*3/uL — ABNORMAL HIGH (ref 1.7–7.7)
Neutrophils Relative %: 89 %
Platelets: 53 10*3/uL — ABNORMAL LOW (ref 150–400)
RBC: 2.98 MIL/uL — ABNORMAL LOW (ref 4.22–5.81)
RDW: 16 % — ABNORMAL HIGH (ref 11.5–15.5)
WBC: 18.7 10*3/uL — ABNORMAL HIGH (ref 4.0–10.5)
nRBC: 0.1 % (ref 0.0–0.2)

## 2022-07-03 LAB — PROTIME-INR
INR: 2.3 — ABNORMAL HIGH (ref 0.8–1.2)
Prothrombin Time: 25.4 seconds — ABNORMAL HIGH (ref 11.4–15.2)

## 2022-07-03 LAB — BLOOD PRODUCT ORDER (VERBAL) VERIFICATION

## 2022-07-03 LAB — HIV ANTIBODY (ROUTINE TESTING W REFLEX): HIV Screen 4th Generation wRfx: NONREACTIVE

## 2022-07-03 LAB — GLUCOSE, CAPILLARY
Glucose-Capillary: 150 mg/dL — ABNORMAL HIGH (ref 70–99)
Glucose-Capillary: 122 mg/dL — ABNORMAL HIGH (ref 70–99)
Glucose-Capillary: 120 mg/dL — ABNORMAL HIGH (ref 70–99)

## 2022-07-03 LAB — LACTIC ACID, PLASMA
Lactic Acid, Venous: 4.4 mmol/L (ref 0.5–1.9)
Lactic Acid, Venous: 1.7 mmol/L (ref 0.5–1.9)

## 2022-07-03 LAB — PREPARE RBC (CROSSMATCH)

## 2022-07-03 SURGERY — EGD (ESOPHAGOGASTRODUODENOSCOPY)
Anesthesia: Choice

## 2022-07-03 MED ORDER — PROPOFOL 1000 MG/100ML IV EMUL: 5.0000 ug/kg/min | INTRAVENOUS | Status: DC

## 2022-07-03 MED ORDER — SODIUM CHLORIDE 0.9 % IV SOLN: INTRAVENOUS | Status: DC

## 2022-07-03 MED ORDER — IOHEXOL 350 MG/ML SOLN
75.0000 mL | Freq: Once | INTRAVENOUS | Status: AC | PRN
  Administered 2022-07-03: 75 mL via INTRAVENOUS

## 2022-07-03 MED ORDER — SODIUM CHLORIDE 0.9 % IV SOLN
1.0000 g | INTRAVENOUS | Status: AC
  Administered 2022-07-03 – 2022-07-07 (×5): 1 g via INTRAVENOUS
  Filled 2022-07-03 (×5): qty 10

## 2022-07-03 MED ORDER — DOCUSATE SODIUM 50 MG/5ML PO LIQD
100.0000 mg | Freq: Two times a day (BID) | ORAL | Status: DC
  Administered 2022-07-05: 100 mg
  Filled 2022-07-03 (×2): qty 10

## 2022-07-03 MED ORDER — MIDAZOLAM HCL 2 MG/2ML IJ SOLN
INTRAMUSCULAR | Status: AC
  Filled 2022-07-03: qty 4

## 2022-07-03 MED ORDER — PROPOFOL 1000 MG/100ML IV EMUL
0.0000 ug/kg/min | INTRAVENOUS | Status: DC
  Administered 2022-07-03 – 2022-07-04 (×3): 25 ug/kg/min via INTRAVENOUS
  Administered 2022-07-04: 20 ug/kg/min via INTRAVENOUS
  Administered 2022-07-05: 10 ug/kg/min via INTRAVENOUS
  Filled 2022-07-03 (×6): qty 100

## 2022-07-03 MED ORDER — SODIUM CHLORIDE 0.9 % IV SOLN: INTRAVENOUS | Status: DC | PRN

## 2022-07-03 MED ORDER — FENTANYL 2500MCG IN NS 250ML (10MCG/ML) PREMIX INFUSION: 0.0000 ug/h | INTRAVENOUS | Status: DC

## 2022-07-03 MED ORDER — POLYETHYLENE GLYCOL 3350 17 G PO PACK
17.0000 g | PACK | Freq: Every day | ORAL | Status: DC
  Administered 2022-07-05: 17 g
  Filled 2022-07-03: qty 1

## 2022-07-03 MED ORDER — FENTANYL CITRATE (PF) 100 MCG/2ML IJ SOLN: 100.0000 ug | INTRAMUSCULAR | Status: DC | PRN

## 2022-07-03 MED ORDER — FENTANYL CITRATE PF 50 MCG/ML IJ SOSY: 50.0000 ug | PREFILLED_SYRINGE | INTRAMUSCULAR | Status: DC | PRN

## 2022-07-03 MED ORDER — POLYETHYLENE GLYCOL 3350 17 G PO PACK: 17.0000 g | PACK | Freq: Every day | ORAL | Status: DC | PRN

## 2022-07-03 MED ORDER — FENTANYL CITRATE PF 50 MCG/ML IJ SOSY
PREFILLED_SYRINGE | INTRAMUSCULAR | Status: AC
  Filled 2022-07-03: qty 2

## 2022-07-03 MED ORDER — ROCURONIUM BROMIDE 10 MG/ML (PF) SYRINGE
100.0000 mg | PREFILLED_SYRINGE | Freq: Once | INTRAVENOUS | Status: AC
  Administered 2022-07-03: 100 mg via INTRAVENOUS

## 2022-07-03 MED ORDER — FENTANYL 2500MCG IN NS 250ML (10MCG/ML) PREMIX INFUSION
0.0000 ug/h | INTRAVENOUS | Status: DC
  Administered 2022-07-03: 25 ug/h via INTRAVENOUS
  Filled 2022-07-03: qty 250

## 2022-07-03 MED ORDER — ETOMIDATE 2 MG/ML IV SOLN
20.0000 mg | Freq: Once | INTRAVENOUS | Status: AC
  Administered 2022-07-03: 20 mg via INTRAVENOUS
  Filled 2022-07-03: qty 10

## 2022-07-03 MED ORDER — LACTATED RINGERS IV BOLUS
1000.0000 mL | Freq: Once | INTRAVENOUS | Status: AC
  Administered 2022-07-03: 1000 mL via INTRAVENOUS

## 2022-07-03 MED ORDER — DOCUSATE SODIUM 100 MG PO CAPS: 100.0000 mg | ORAL_CAPSULE | Freq: Two times a day (BID) | ORAL | Status: DC | PRN

## 2022-07-03 MED ORDER — PROCHLORPERAZINE EDISYLATE 10 MG/2ML IJ SOLN: 10.0000 mg | Freq: Once | INTRAMUSCULAR | Status: DC

## 2022-07-03 MED ORDER — ONDANSETRON HCL 4 MG/2ML IJ SOLN: 4.0000 mg | Freq: Four times a day (QID) | INTRAMUSCULAR | Status: DC | PRN

## 2022-07-03 MED ORDER — SODIUM CHLORIDE 0.9% IV SOLUTION: Freq: Once | INTRAVENOUS | Status: DC

## 2022-07-03 MED ORDER — CHLORHEXIDINE GLUCONATE CLOTH 2 % EX PADS
6.0000 | MEDICATED_PAD | Freq: Every day | CUTANEOUS | Status: DC
  Administered 2022-07-04 – 2022-07-08 (×6): 6 via TOPICAL

## 2022-07-03 MED ORDER — MIDAZOLAM-SODIUM CHLORIDE 100-0.9 MG/100ML-% IV SOLN: 0.5000 mg/h | INTRAVENOUS | Status: DC

## 2022-07-03 MED ORDER — FENTANYL CITRATE PF 50 MCG/ML IJ SOSY
100.0000 ug | PREFILLED_SYRINGE | Freq: Once | INTRAMUSCULAR | Status: AC
  Administered 2022-07-03: 100 ug via INTRAVENOUS
  Filled 2022-07-03: qty 2

## 2022-07-03 MED ORDER — PROPOFOL 1000 MG/100ML IV EMUL
INTRAVENOUS | Status: AC
  Filled 2022-07-03: qty 100

## 2022-07-03 MED ORDER — NOREPINEPHRINE 4 MG/250ML-% IV SOLN
0.0000 ug/min | INTRAVENOUS | Status: DC
  Administered 2022-07-03: 2 ug/min via INTRAVENOUS
  Administered 2022-07-03: 7 ug/min via INTRAVENOUS
  Administered 2022-07-04: 18 ug/min via INTRAVENOUS
  Administered 2022-07-04: 7 ug/min via INTRAVENOUS
  Administered 2022-07-04: 17 ug/min via INTRAVENOUS
  Administered 2022-07-05: 9 ug/min via INTRAVENOUS
  Filled 2022-07-03 (×8): qty 250

## 2022-07-03 NOTE — ED Notes (Signed)
Propofol started at 10/mcg per MD Duwayne Heck

## 2022-07-03 NOTE — ED Provider Notes (Signed)
[facility] EMERGENCY DEPARTMENT Provider Note   CSN: [id number] Arrival date & time: 07/02/22  2248     History  Chief Complaint  Patient presents with   Hematemesis    Mace Weinberg is a 51 y.o. male.  51 yo M here with upper gi bleed starting tonight. Hypotensive with EMS and tachycardic with active hematemesis. H/o cirrhosis 2/2 alcohol but states he only drinks 2-3 drinks a day nowadays. No fever. No trauma. No other associated symptoms.         Home Medications Prior to Admission medications   Medication Sig Start Date End Date Taking? Authorizing Provider  B Complex Vitamins (B COMPLEX 1 PO) Take 1 tablet by mouth daily. Patient not taking: Reported on 07/02/2022    [provider]  carvedilol (COREG) 3.125 MG tablet Take 1 tablet (3.125 mg total) by mouth 2 (two) times daily with a meal. Patient not taking: Reported on 07/02/2022 12/28/21   Mack Hook, MD  furosemide (LASIX) 20 MG tablet Take 1 tablet (20 mg total) by mouth daily. Patient not taking: Reported on 06/10/2022 03/11/22   Rondel Oh, MD  gabapentin (NEURONTIN) 100 MG capsule 1 cap by mouth at bedtime Patient not taking: Reported on 03/18/2022 12/28/21   Mack Hook, MD  Multiple Vitamin (MULTIVITAMIN WITH MINERALS) TABS tablet Take 1 tablet by mouth daily. Patient not taking: Reported on 07/02/2022 05/23/19   Debbe Odea, MD      Allergies    Patient has no known allergies.    Review of Systems   Review of Systems  Physical Exam Updated Vital Signs BP 121/79   Pulse (!) 128   Temp 99.5 F (37.5 C) (Oral)   Resp 18   Ht '6\' 1"'$  (1.854 m)   Wt 90.7 kg   SpO2 99%   BMI 26.39 kg/m  Physical Exam Vitals and nursing note reviewed.  Constitutional:      Appearance: He is well-developed.  HENT:     Head: Normocephalic and atraumatic.     Mouth/Throat:     Mouth: Mucous membranes are dry.     Comments: Dried blood in and around mouth Eyes:     Pupils:  Pupils are equal, round, and reactive to light.  Cardiovascular:     Rate and Rhythm: Tachycardia present.  Pulmonary:     Effort: Pulmonary effort is normal. No respiratory distress.  Abdominal:     General: There is no distension.  Musculoskeletal:        General: Normal range of motion.     Cervical back: Normal range of motion.  Skin:    General: Skin is warm and dry.     Coloration: Skin is jaundiced and pale.  Neurological:     General: No focal deficit present.     Mental Status: He is alert.     ED Results / Procedures / Treatments   Labs (all labs ordered are listed, but only abnormal results are displayed) Labs Reviewed  COMPREHENSIVE METABOLIC PANEL - Abnormal; Notable for the following components:      Result Value   Chloride 112 (*)    CO2 15 (*)    Glucose, Bld 175 (*)    BUN 23 (*)    Calcium 7.8 (*)    Total Protein 5.1 (*)    Albumin 2.1 (*)    AST 50 (*)    Total Bilirubin 2.4 (*)    All other components within normal limits  CBC WITH DIFFERENTIAL/PLATELET -  Abnormal; Notable for the following components:   WBC 25.3 (*)    RBC 1.66 (*)    Hemoglobin 5.9 (*)    HCT 17.7 (*)    MCV 106.6 (*)    MCH 35.5 (*)    Platelets 147 (*)    Neutro Abs 21.8 (*)    Monocytes Absolute 1.2 (*)    Abs Immature Granulocytes 0.15 (*)    All other components within normal limits  LACTIC ACID, PLASMA - Abnormal; Notable for the following components:   Lactic Acid, Venous 8.5 (*)    All other components within normal limits  PROTIME-INR - Abnormal; Notable for the following components:   Prothrombin Time 21.3 (*)    INR 1.9 (*)    All other components within normal limits  PROTIME-INR - Abnormal; Notable for the following components:   Prothrombin Time 25.4 (*)    INR 2.3 (*)    All other components within normal limits  CBC WITH DIFFERENTIAL/PLATELET - Abnormal; Notable for the following components:   WBC 18.7 (*)    RBC 2.98 (*)    Hemoglobin 9.7 (*)    HCT  27.4 (*)    RDW 16.0 (*)    Platelets 53 (*)    Neutro Abs 16.8 (*)    Abs Immature Granulocytes 0.12 (*)    All other components within normal limits  I-STAT CHEM 8, ED - Abnormal; Notable for the following components:   BUN 25 (*)    Creatinine, Ser 0.60 (*)    Glucose, Bld 166 (*)    Calcium, Ion 1.06 (*)    TCO2 16 (*)    Hemoglobin 7.5 (*)    HCT 22.0 (*)    All other components within normal limits  CULTURE, BLOOD (ROUTINE X 2)  CULTURE, BLOOD (ROUTINE X 2)  LIPASE, BLOOD  URINALYSIS, ROUTINE W REFLEX MICROSCOPIC  HIV ANTIBODY (ROUTINE TESTING W REFLEX)  URINALYSIS, ROUTINE W REFLEX MICROSCOPIC  LACTIC ACID, PLASMA  LACTIC ACID, PLASMA  TYPE AND SCREEN  PREPARE RBC (CROSSMATCH)  PREPARE RBC (CROSSMATCH)  PREPARE FRESH FROZEN PLASMA  PREPARE PLATELET PHERESIS    EKG EKG Interpretation  Date/Time:  Tuesday July 02 2022 22:58:58 EST Ventricular Rate:  145 PR Interval:  96 QRS Duration: 104 QT Interval:  318 QTC Calculation: 494 R Axis:   23 Text Interpretation: Sinus tachycardia Borderline prolonged QT interval Artifact in lead(s) I II aVR aVL Confirmed by Merrily Pew [phone]) on 07/02/2022 11:09:13 PM  Radiology DG CHEST PORT 1 VIEW  Result Date: 07/03/2022 CLINICAL DATA:  [id number] with ventilator dependent respiratory failure. EXAM: PORTABLE CHEST 1 VIEW COMPARISON:  Portable chest yesterday at 11:07 p.m. FINDINGS: 4:13 a.m. ETT interval insertion with tip 4.5 cm from the carina, mid tracheal. The cardiac size is normal. The mediastinum is normally outlined. No vascular congestion is seen. The lungs are clear. The sulci are sharp. The thoracic cage is intact. There are multiple overlying monitor wires. IMPRESSION: 1. No evidence of acute chest disease. 2. ETT interval insertion with tip 4.5 cm from the carina. Electronically Signed   By: Telford Nab M.D.   On: 07/03/2022 04:28   DG Abd Acute W/Chest  Result Date: 07/02/2022 CLINICAL DATA:  Hematemesis  EXAM: DG ABDOMEN ACUTE WITH 1 VIEW CHEST COMPARISON:  05/20/2019 FINDINGS: Cardiac shadow is within normal limits. The lungs are clear bilaterally. Scattered large and small bowel gas is noted. No obstructive changes are seen. No free air is noted. No bony abnormality  is seen. IMPRESSION: No acute abnormality noted. Electronically Signed   By: Inez Catalina M.D.   On: 07/02/2022 23:25    Procedures .Critical Care  Performed by: Merrily Pew, MD Authorized by: Merrily Pew, MD   Critical care provider statement:    Critical care time (minutes):  75   Critical care was necessary to treat or prevent imminent or life-threatening deterioration of the following conditions:  Shock, circulatory failure and hepatic failure   Critical care was time spent personally by me on the following activities:  Development of treatment plan with patient or surrogate, discussions with consultants, evaluation of patient's response to treatment, examination of patient, ordering and review of laboratory studies, ordering and review of radiographic studies, ordering and performing treatments and interventions, pulse oximetry, re-evaluation of patient's condition and review of old charts   Medications Ordered in ED Medications  0.9 %  sodium chloride infusion (Manually program via Guardrails IV Fluids) ( Intravenous MAR Unhold 07/03/22 0446)  pantoprozole (PROTONIX) 80 mg /NS 100 mL infusion (8 mg/hr Intravenous New Bag/Given 07/03/22 0008)  pantoprazole (PROTONIX) injection 40 mg ( Intravenous MAR Unhold 07/03/22 0446)  octreotide (SANDOSTATIN) 2 mcg/mL load via infusion 50 mcg (50 mcg Intravenous Bolus from Bag 07/03/22 0012)    And  octreotide (SANDOSTATIN) 500 mcg in sodium chloride 0.9 % 250 mL (2 mcg/mL) infusion (50 mcg/hr Intravenous New Bag/Given 07/03/22 0011)  0.9 %  sodium chloride infusion (Manually program via Guardrails IV Fluids) ( Intravenous MAR Unhold 07/03/22 0446)  prochlorperazine (COMPAZINE)  injection 10 mg ( Intravenous MAR Unhold 07/03/22 0446)  0.9 %  sodium chloride infusion (Manually program via Guardrails IV Fluids) ( Intravenous MAR Unhold 07/03/22 0446)  docusate sodium (COLACE) capsule 100 mg ( Oral MAR Unhold 07/03/22 0446)  polyethylene glycol (MIRALAX / GLYCOLAX) packet 17 g ( Oral MAR Unhold 07/03/22 0446)  ondansetron (ZOFRAN) injection 4 mg ( Intravenous MAR Unhold 07/03/22 0446)  0.9 %  sodium chloride infusion ( Intra-arterial MAR Unhold 07/03/22 0446)  norepinephrine (LEVOPHED) '4mg'$  in 263m (0.016 mg/mL) premix infusion ( Intravenous MAR Unhold 07/03/22 0446)  midazolam (VERSED) 100 mg/100 mL (1 mg/mL) premix infusion (has no administration in time range)  propofol (DIPRIVAN) 1000 MG/100ML infusion (has no administration in time range)  fentaNYL (SUBLIMAZE) injection 100 mcg (has no administration in time range)  fentaNYL 25086m in NS 25068m55m54ml) infusion-PREMIX (has no administration in time range)  0.9 %  sodium chloride infusion (Manually program via Guardrails IV Fluids) (has no administration in time range)  lactated ringers bolus 1,000 mL (0 mLs Intravenous Stopped 07/03/22 0149)  metoCLOPramide (REGLAN) injection 10 mg (10 mg Intravenous Given 07/02/22 2324)  fentaNYL (SUBLIMAZE) injection 50 mcg (50 mcg Intravenous Given 07/02/22 2324)  cefTRIAXone (ROCEPHIN) 1 g in sodium chloride 0.9 % 100 mL IVPB (0 g Intravenous Stopped 07/03/22 0012)  pantoprazole (PROTONIX) 80 mg /NS 100 mL IVPB (0 mg Intravenous Stopped 07/03/22 0012)  lactated ringers bolus 1,000 mL (0 mLs Intravenous Stopped 07/03/22 0300)  etomidate (AMIDATE) injection 20 mg (20 mg Intravenous Given 07/03/22 0344)  rocuronium bromide 10 mg/mL (PF) syringe (100 mg Intravenous Given 07/03/22 0344)  fentaNYL (SUBLIMAZE) injection 100 mcg (100 mcg Intravenous Given 07/03/22 0343)    ED Course/ Medical Decision Making/ A&P                           Medical Decision Making Active  hematemesis. Likely related to h/o varices as he  has been diagnosed before. Will initiate octreotide, protonix and rocephin. Previous doc spoke with GI, will see. Will work on stabilization.   Problems Addressed: Hematemesis with nausea: acute illness or injury with systemic symptoms that poses a threat to life or bodily functions  Amount and/or Complexity of Data Reviewed Independent Historian: EMS External Data Reviewed: labs, radiology and ECG. Labs: ordered.    Details: Initial hemoglobin of 5.9 Radiology: ordered. ECG/medicine tests: ordered and independent interpretation performed.    Details: Sinus tachycardia w/ prolonged QTc  Risk Prescription drug management. Decision regarding hospitalization.   Patient stabilized some with initial emergent release blood products but then decompensated again requiring more fluids and blood. Had three episodes of frank hematemesis while here. Discussed with Dr. Hilarie Fredrickson who discussed with ICU team. Will come up with plan based on stability. Admitted to ICU.   Final Clinical Impression(s) / ED Diagnoses Final diagnoses:  Hematemesis with nausea    Rx / DC Orders ED Discharge Orders     None         Christyanna Mckeon, Corene Cornea, MD 07/03/22 [phone]

## 2022-07-03 NOTE — ED Notes (Signed)
50 fentanyl given

## 2022-07-03 NOTE — ED Notes (Addendum)
Endo team at bedside - preparing for scope - Dr Duwayne Heck and Dr Jamison Oka at bedside  Consent for intubation and consent for endoscopy signed by pt

## 2022-07-03 NOTE — Progress Notes (Signed)
Contacted by ER MD for new consult, pt with hematemesis.  Chart reviewed. Similar presentation in Oct 2020 when seen by GI during admission for UGI bleeding.  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  No endoscopic intervention at that time. That hospitalization cut short by patient's financial constraints and he never followed up with GI.  This presentation: acute hematemesis with ABLA, hypercoagulable state, mild thrombocytopenia.  Hypotension, transient responding to IVFs and emergency release blood.  I have spoken by phone with Dr. Duwayne Heck with PCCM. She will see the patient now for admission and help with triage regarding timing of EGD.   If unstable, emergent bedside EGD to be considered, but in setting of acute UGI bleed and known portal HTN he would need to be intubated for airway protection and sedated.  If currently stable then urgent EGD to be performed later today with anesthesia support.  Continue octreotide and PPI gtt. If stable goal Hgb 7-8 g/dL given concern for portal HTN GI bleeding  Formal consult to follow

## 2022-07-03 NOTE — ED Notes (Signed)
Additional 50 of fentanyl given, '2mg'$  Versed given

## 2022-07-03 NOTE — ED Notes (Signed)
Per MD Duwayne Heck keep MAP <90

## 2022-07-03 NOTE — ED Notes (Addendum)
Redness noted to IV site that blood being pressure bagged through. Blood transfusion stopped and redness gone away. MD Duwayne Heck notified - ok to continue giving blood through other IV - Blood bank notified as well. Unit stopped completley and discarded

## 2022-07-03 NOTE — Progress Notes (Signed)
[facility] Progress Note Patient Name: Stephen Miranda DOB: 1971/03/06 MRN: [id number]   Date of Service  07/03/2022  HPI/Events of Note  Nursing request for ventilator orders.   eICU Interventions  Plan: Ventilator orders: 40%/PRVC 18/TV 640/P 5.     Intervention Category Major Interventions: Respiratory failure - evaluation and management  Lorina Duffner Eugene 07/03/2022, 6:12 AM

## 2022-07-03 NOTE — Consult Note (Addendum)
Referring Provider:  Dr. 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  Acute UGI bleeding -- hemodynamic compromise in setting of UGI bleed and portal HTN in pt with ETOH abuse and cirrhosis. --emergent bedside EGD after PCCM airway management/intubation --octreotide and PPI gtt --pRBCs, FFP --Vit K --IV abx for prophylaxis --may need emergent IR consult depending on EGD findings (given previous gastric and duodenal varices) --Eventual repeat liver imaging once more stable --ETOH abstinence  --Eventual restart Bblocker  MELD 3.0: 20 at 07/03/2022  3:31 AM MELD-Na: 19 at 07/03/2022  3:31 AM Calculated from: Serum Creatinine: 0.60 mg/dL (Using min of 1 mg/dL) at 07/02/2022 11:10 PM Serum Sodium: 142 mmol/L (Using max of 137 mmol/L) at 07/02/2022 11:10 PM Total Bilirubin: 2.4 mg/dL at 07/02/2022 11:00 PM Serum Albumin: 2.1 g/dL at 07/02/2022 11:00 PM INR(ratio): 2.3 at 07/03/2022  3:31 AM Age at listing (hypothetical): 42 years Sex: Male at 07/03/2022  3:31 AM  MELD acutely more elevated due to active bleeding and consumption/coagulopathy (INR was 1.2 3 months ago, 1.9 on presentation and then 2.3 just 2 hours later).    HPI:     Stephen Miranda is a 51 y.o. male presenting with hematemesis and hemodynamic instability.  Symptoms started abruptly mid day 07/02/22.  Initially coffee ground emesis and melena becoming bright red hematemesis.  No real abd pain, upper pressure sensation.  Dizziness on presentation.  No altered mental  status.  Mom and beside.  Now s/p 4 u rapid infusion pRBCs and 2 units FFP.   Previous 4 units emerg release Total: 8 units pRBCs  Pt reports rare ETOH over past year though previous heavy use.  Last drink estimated in March 2023.   ETOH abuse runs in his family. He has been working as [employer] Dr. Amil Amen and previously prescriptions meds discontinued. No OTC including no APAP or NSAIDs.  In ER multiple episodes of hematemesis and now hematochezia.  Currently HR 263Z, BP 858 systolic after rapid infusions.  PPI and octreotide infusions going.   Past Medical History:  Diagnosis Date   Chronic insomnia 05/23/2016   Has stated in past since age 83 yo, but worse in recent years with restless legs.   Elevated liver enzymes 04/24/2015   04/18/2015:  AST:  115     ALT:  90. Likely due to alcohol intake   Malignant melanoma of back ([place]) 02/07/2015   Left upper back:  Dr. Sarajane Jews, [facility]   Restless legs 05/23/2016   Seborrheic dermatitis 05/23/2016    Past Surgical History:  Procedure Laterality Date   ESOPHAGOGASTRODUODENOSCOPY (EGD) WITH PROPOFOL N/A 05/20/2019   Procedure: ESOPHAGOGASTRODUODENOSCOPY (EGD) WITH PROPOFOL;  Surgeon: Irving Copas., MD;  Location: [facility];  Service: Gastroenterology;  Laterality: N/A;   Excision Malignant Melanoma Left 02/07/2015   Left upper back    Prior to Admission medications   Medication Sig Start Date End Date Taking? Authorizing Provider  B Complex Vitamins (B  COMPLEX 1 PO) Take 1 tablet by mouth daily. Patient not taking: Reported on 07/02/2022    [provider]  carvedilol (COREG) 3.125 MG tablet Take 1 tablet (3.125 mg total) by mouth 2 (two) times daily with a meal. Patient not taking: Reported on 07/02/2022 12/28/21   Mack Hook, MD  furosemide (LASIX) 20 MG tablet Take 1 tablet (20 mg total) by mouth daily. Patient not taking: Reported on 06/10/2022 03/11/22   Rondel Oh, MD   gabapentin (NEURONTIN) 100 MG capsule 1 cap by mouth at bedtime Patient not taking: Reported on 03/18/2022 12/28/21   Mack Hook, MD  Multiple Vitamin (MULTIVITAMIN WITH MINERALS) TABS tablet Take 1 tablet by mouth daily. Patient not taking: Reported on 07/02/2022 05/23/19   Debbe Odea, MD    Current Facility-Administered Medications  Medication Dose Route Frequency Provider Last Rate Last Admin   0.9 %  sodium chloride infusion (Manually program via Guardrails IV Fluids)   Intravenous Once Godfrey Pick, MD       0.9 %  sodium chloride infusion (Manually program via Guardrails IV Fluids)   Intravenous Once Mesner, Corene Cornea, MD       0.9 %  sodium chloride infusion (Manually program via Guardrails IV Fluids)   Intravenous Once Collier Bullock, MD       docusate sodium (COLACE) capsule 100 mg  100 mg Oral BID PRN Collier Bullock, MD       octreotide (SANDOSTATIN) 500 mcg in sodium chloride 0.9 % 250 mL (2 mcg/mL) infusion  50 mcg/hr Intravenous Continuous Godfrey Pick, MD 25 mL/hr at 07/03/22 0011 50 mcg/hr at 07/03/22 0011   ondansetron (ZOFRAN) injection 4 mg  4 mg Intravenous Q6H PRN Collier Bullock, MD       Derrill Memo ON 07/06/2022] pantoprazole (PROTONIX) injection 40 mg  40 mg Intravenous Q12H Godfrey Pick, MD       pantoprozole (PROTONIX) 80 mg /NS 100 mL infusion  8 mg/hr Intravenous Continuous Godfrey Pick, MD 10 mL/hr at 07/03/22 0008 8 mg/hr at 07/03/22 0008   polyethylene glycol (MIRALAX / GLYCOLAX) packet 17 g  17 g Oral Daily PRN Collier Bullock, MD       prochlorperazine (COMPAZINE) injection 10 mg  10 mg Intravenous Once Mesner, Corene Cornea, MD       Current Outpatient Medications  Medication Sig Dispense Refill   B Complex Vitamins (B COMPLEX 1 PO) Take 1 tablet by mouth daily. (Patient not taking: Reported on 07/02/2022)     carvedilol (COREG) 3.125 MG tablet Take 1 tablet (3.125 mg total) by mouth 2 (two) times daily with a meal. (Patient not taking: Reported on 07/02/2022) 60  tablet 11   furosemide (LASIX) 20 MG tablet Take 1 tablet (20 mg total) by mouth daily. (Patient not taking: Reported on 06/10/2022) 10 tablet 0   gabapentin (NEURONTIN) 100 MG capsule 1 cap by mouth at bedtime (Patient not taking: Reported on 03/18/2022) 30 capsule 11   Multiple Vitamin (MULTIVITAMIN WITH MINERALS) TABS tablet Take 1 tablet by mouth daily. (Patient not taking: Reported on 07/02/2022) 30 tablet 3    Allergies as of 07/02/2022   (No Known Allergies)    Family History  Problem Relation Age of Onset   Fibromyalgia Mother     Social History   Tobacco Use   Smoking status: Every Day    Packs/day: 0.50    Years: 20.00    Total pack years: 10.00    Types: Cigarettes   Smokeless tobacco: Never   Tobacco comments:  prescribed Chantix in past, but did not take  Vaping Use   Vaping Use: Never used  Substance Use Topics   Alcohol use: Not Currently    Alcohol/week: 4.0 standard drinks of alcohol    Types: 2 Cans of beer, 2 Shots of liquor per week    Comment: Nightly   Drug use: No    Review of Systems: All systems reviewed and negative except where noted in HPI.  Physical Exam: Vital signs in last 24 hours: Temp:  [98.1 F (36.7 C)-99.5 F (37.5 C)] 99.5 F (37.5 C) (12/13 0250) Pulse Rate:  [113-138] 116 (12/13 0214) Resp:  [13-23] 17 (12/13 0214) BP: (74-124)/(45-67) 87/52 (12/13 0214) SpO2:  [98 %-100 %] 100 % (12/13 0214) Weight:  [90.7 kg-102.1 kg] 90.7 kg (12/13 0020)   General:   Awake, alert, NAD Psych:  Pleasant, cooperative. Normal mood and affect. Eyes:  Pupils equal, sclera clear, no icterus.    Neck:  Supple; no masses Lungs:  Clear throughout to auscultation.   No wheezes, crackles, or rhonchi.  Heart:  tachycardic, regular rhythm; no murmurs, Abdomen:  Soft, non-distended, nontender, BS active, no palp mass   Rectal:  Deferred, bright red blood covering bed sheet from rectum Msk:  Symmetrical without gross deformities. . Ext: 1+ LE  edema and changes of chronic venous stasis b/l LE Neurologic:  Alert and  oriented x4;  grossly normal neurologically.  No asterixis Skin:  Intact without significant lesions or rashes.   Intake/Output from previous day: 12/12 0701 - 12/13 0700 In: 1326.6 [Blood:323.3; IV Piggyback:1003.3] Out: -  Intake/Output this shift: Total I/O In: 1326.6 [Blood:323.3; IV Piggyback:1003.3] Out: -   Lab Results: Recent Labs    07/02/22 2300 07/02/22 2310  WBC 25.3*  --   HGB 5.9* 7.5*  HCT 17.7* 22.0*  PLT 147*  --    BMET Recent Labs    07/02/22 2300 07/02/22 2310  NA 140 142  K 4.1 4.1  CL 112* 110  CO2 15*  --   GLUCOSE 175* 166*  BUN 23* 25*  CREATININE 0.78 0.60*  CALCIUM 7.8*  --    LFT Recent Labs    07/02/22 2300  PROT 5.1*  ALBUMIN 2.1*  AST 50*  ALT 17  ALKPHOS 57  BILITOT 2.4*   PT/INR Recent Labs    07/02/22 2300  LABPROT 21.3*  INR 1.9*   Hepatitis Panel No results for input(s): "HEPBSAG", "HCVAB", "HEPAIGM", "HEPBIGM" in the last 72 hours.   .    Latest Ref Rng & Units 07/02/2022   11:10 PM 07/02/2022   11:00 PM 06/10/2022   12:30 PM  CBC  WBC 4.0 - 10.5 K/uL  25.3  4.0   Hemoglobin 13.0 - 17.0 g/dL 7.5  5.9  12.8   Hematocrit 39.0 - 52.0 % 22.0  17.7  37.0   Platelets 150 - 400 K/uL  147  39     .    Latest Ref Rng & Units 07/02/2022   11:10 PM 07/02/2022   11:00 PM 06/10/2022   12:30 PM  CMP  Glucose 70 - 99 mg/dL 166  175  89   BUN 6 - 20 mg/dL 25  23  <5   Creatinine 0.61 - 1.24 mg/dL 0.60  0.78  0.70   Sodium 135 - 145 mmol/L 142  140  136   Potassium 3.5 - 5.1 mmol/L 4.1  4.1  3.4   Chloride 98 - 111 mmol/L 110  112  103   CO2 22 - 32 mmol/L  15  23   Calcium 8.9 - 10.3 mg/dL  7.8  8.9   Total Protein 6.5 - 8.1 g/dL  5.1  8.2   Total Bilirubin 0.3 - 1.2 mg/dL  2.4  5.6   Alkaline Phos 38 - 126 U/L  57  77   AST 15 - 41 U/L  50  59   ALT 0 - 44 U/L  17  21    Studies/Results: DG Abd Acute W/Chest  Result Date:  07/02/2022 CLINICAL DATA:  Hematemesis EXAM: DG ABDOMEN ACUTE WITH 1 VIEW CHEST COMPARISON:  05/20/2019 FINDINGS: Cardiac shadow is within normal limits. The lungs are clear bilaterally. Scattered large and small bowel gas is noted. No obstructive changes are seen. No free air is noted. No bony abnormality is seen. IMPRESSION: No acute abnormality noted. Electronically Signed   By: Inez Catalina M.D.   On: 07/02/2022 23:25    Principal Problem:   Hematemesis    Lajuan Lines. Kachina Niederer, M.D. @  07/03/2022, 2:51 AM

## 2022-07-03 NOTE — Progress Notes (Signed)
07/03/2022 Seen and examined. Comfortable on vent. Lungs clear, abd soft Low dose pressors likely sedation related. No further GIB. Discussed with IR, plan for TIPS vs. BRTO tomorrow, will let rest on vent until then. Add ceftriaxone for SBP ppx. Monitor CBC, transfuse for usual thresholds Called mother: no answer and no VM set up  My cc time: 25 mins Erskine Emery MD PCCM

## 2022-07-03 NOTE — ED Notes (Signed)
Unit stopped

## 2022-07-03 NOTE — ED Notes (Signed)
20 Etomidate  100 Roc  given by Betsey Holiday EMT

## 2022-07-03 NOTE — Progress Notes (Signed)
Initial Nutrition Assessment  DOCUMENTATION CODES:   Non-severe (moderate) malnutrition in context of chronic illness  INTERVENTION:   Assess ability to initiate TF post procedure; currently no EN access  Tube Feeding Recommendations:  Vital 1.5 at 65 ml/hr Pro-Source TF20 60 ml daily TF at goal provides 2420 kcals, 125 g of protein and 1185 mL of free water   NUTRITION DIAGNOSIS:   Moderate Malnutrition related to chronic illness as evidenced by moderate muscle depletion, moderate fat depletion.   GOAL:   Patient will meet greater than or equal to 90% of their needs  MONITOR:   Vent status, Labs, Skin  REASON FOR ASSESSMENT:   Ventilator    ASSESSMENT:   42 you male admitted with recurrent acute upper GI bleed. PMH includes EtOH abuse, portal HTN, hx of GI bleed with esophageal, IGV and duodenal varices, cirrhosis  12/13 EGD: esophageal varices, possible Barrett's , gastroesophageal varices. CTA with portal HTN, trace ascites and large gastrorenal shunt  Pt currently sedated on vent support. Noted plan for BRTO vs TIPS procedure tomorrow  +mild/moderate edema on exam. Current wt 90.7 kg. Noted recent weights this year closer to 83 kg  Unable to obtain diet and weight history from patient at this time  Pt currently meets clinical characteristics for moderate malnutrition; once able to obtain more information, pt may very well meet criteria for severe malnutrition  Labs: reviewed Meds: reviewed   NUTRITION - FOCUSED PHYSICAL EXAM:  Flowsheet Row Most Recent Value  Orbital Region Moderate depletion  Upper Arm Region Severe depletion  Thoracic and Lumbar Region Moderate depletion  Buccal Region Unable to assess  Temple Region Moderate depletion  Clavicle Bone Region Mild depletion  Clavicle and Acromion Bone Region Moderate depletion  Scapular Bone Region Moderate depletion  Dorsal Hand Unable to assess  Patellar Region Unable to assess  Anterior Thigh  Region Unable to assess  Posterior Calf Region Unable to assess  Edema (RD Assessment) Mild       Diet Order:   Diet Order             Diet NPO time specified  Diet effective now                   EDUCATION NEEDS:   Not appropriate for education at this time  Skin:  Skin Assessment: Skin Integrity Issues: Skin Integrity Issues:: Stage I Stage I: sacrum  Last BM:  12/13  Height:   Ht Readings from Last 1 Encounters:  07/03/22 '6\' 1"'$  (1.854 m)    Weight:   Wt Readings from Last 1 Encounters:  07/03/22 90.7 kg     BMI:  Body mass index is 26.39 kg/m.  Estimated Nutritional Needs:   Kcal:  2300-2500 kcals  Protein:  125-150 g  Fluid:  >/= 2 L    Kerman Passey MS, RDN, LDN, CNSC Registered Dietitian 3 Clinical Nutrition RD Pager and On-Call Pager Number Located in [place]

## 2022-07-03 NOTE — Progress Notes (Signed)
[facility] Progress Note Patient Name: Stephen Miranda DOB: 10-16-1970 MRN: [id number]   Date of Service  07/03/2022  HPI/Events of Note  Patient on a Fentanyl IV infusion for sedation on mechanical ventilation. No order for Fentanyl IV infusion.   eICU Interventions  Plan: Fentanyl IV infusion. Titrate to RASS = 0 to -1.     Intervention Category Major Interventions: Delirium, psychosis, severe agitation - evaluation and management  Campbell Kray Eugene 07/03/2022, 9:16 PM

## 2022-07-03 NOTE — ED Notes (Signed)
100 mcg fentanyl given by Betsey Holiday EMT

## 2022-07-03 NOTE — Progress Notes (Signed)
Patient was transported to CT and back to [facility] without any complications.

## 2022-07-03 NOTE — Consult Note (Signed)
Chief Complaint: Patient was seen in consultation today for  Chief Complaint  Patient presents with   Hematemesis   at the request of Dr. Hilarie Fredrickson  Referring Physician(s): Zenovia Jarred, MD  Supervising Physician: Mir, Sharen Heck  Patient Status: [facility] - In-pt  History of Present Illness: Stephen Miranda is a 51 y.o. male with ETOH abuse and portal hypertension, hx of UGI bleeding in 2020 with esophageal, IGV, and duodenal varices.  He presented to ED 12/12 with recurrent acute UGI bleeding with hematemesis, hemodynamic instability, thrombocytopenia and coagulopathy.  CTA demonstrated portal hypertension, trace ascites, and large gastrorenal shunt.    Bedside Endoscopy  Grade II (medium-sized) esophageal varices with no bleeding and no stigmata of recent bleeding.  Esophageal mucosal changes suspicious for long-segment Barrett's esophagus, 1-2 cm hiatal hernia, Type 2 gastroesophageal varices without active bleeding, but WITH stigmata of recent bleeding.   Patient intubated in ED and remains on vent.  MELD-NA on admission 19  IR consulted for possible intervention.  Past Medical History:  Diagnosis Date   Chronic insomnia 05/23/2016   Has stated in past since age 63 yo, but worse in recent years with restless legs.   Elevated liver enzymes 04/24/2015   04/18/2015:  AST:  115     ALT:  90. Likely due to alcohol intake   Malignant melanoma of back ([place]) 02/07/2015   Left upper back:  Dr. Sarajane Jews, [facility]   Restless legs 05/23/2016   Seborrheic dermatitis 05/23/2016    Past Surgical History:  Procedure Laterality Date   ESOPHAGOGASTRODUODENOSCOPY (EGD) WITH PROPOFOL N/A 05/20/2019   Procedure: ESOPHAGOGASTRODUODENOSCOPY (EGD) WITH PROPOFOL;  Surgeon: Irving Copas., MD;  Location: [place];  Service: Gastroenterology;  Laterality: N/A;   Excision Malignant Melanoma Left 02/07/2015   Left upper back    Allergies: Patient has no known  allergies.  Medications: Prior to Admission medications   Medication Sig Start Date End Date Taking? Authorizing Provider  carvedilol (COREG) 3.125 MG tablet Take 1 tablet (3.125 mg total) by mouth 2 (two) times daily with a meal. Patient not taking: Reported on 07/02/2022 12/28/21   Mack Hook, MD  furosemide (LASIX) 20 MG tablet Take 1 tablet (20 mg total) by mouth daily. Patient not taking: Reported on 06/10/2022 03/11/22   Rondel Oh, MD  gabapentin (NEURONTIN) 100 MG capsule 1 cap by mouth at bedtime Patient not taking: Reported on 03/18/2022 12/28/21   Mack Hook, MD  Multiple Vitamin (MULTIVITAMIN WITH MINERALS) TABS tablet Take 1 tablet by mouth daily. 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  Tobacco Use   Smoking status: Every Day    Packs/day: 0.50    Years: 20.00    Total pack years: 10.00    Types: Cigarettes   Smokeless tobacco: Never   Tobacco comments:    prescribed Chantix in past, but did not take  Vaping Use   Vaping Use: Never used  Substance and Sexual Activity   Alcohol use:  Not Currently    Alcohol/week: 4.0 standard drinks of alcohol    Types: 2 Cans of beer, 2 Shots of liquor per week    Comment: Nightly   Drug use: No   Sexual activity: Not on file  Other Topics Concern   Not on file  Social History Narrative   Currently lives with parents   Social Determinants of Health   Financial Resource Strain: Not on file  Food Insecurity: Not on file  Transportation Needs: Not on file  Physical  Activity: Not on file  Stress: Not on file  Social Connections: Not on file    Review of Systems: unable to obtain  Vital Signs: BP (!) 112/55   Pulse 96   Temp 99.5 F (37.5 C) (Oral)   Resp 18   Ht '6\' 1"'$  (1.854 m)   Wt 200 lb (90.7 kg)   SpO2 100%   BMI 26.39 kg/m   Physical Exam Vitals reviewed.  Constitutional:      Appearance: He is ill-appearing.     Interventions: He is sedated and intubated.  Cardiovascular:     Rate and Rhythm: Normal rate and regular rhythm.     Pulses: Normal pulses.     Heart sounds: Normal heart sounds.  Pulmonary:     Effort: Pulmonary effort is normal. He is intubated.     Breath sounds: Normal breath sounds.  Abdominal:     General: Abdomen is flat.     Palpations: Abdomen is soft.  Musculoskeletal:     Right lower leg: Edema present.     Left lower leg: Edema present.  Skin:    Comments: Cool, but dry     Imaging: CT Angio Abd/Pel w/ and/or w/o  Result Date: 07/03/2022 CLINICAL DATA:  51 year old male with history of portal hypertension and hematemesis. EXAM: CTA ABDOMEN AND PELVIS WITHOUT AND WITH CONTRAST TECHNIQUE: Multidetector CT imaging of the abdomen and pelvis was performed using the standard protocol during bolus administration of intravenous contrast. Multiplanar reconstructed images and MIPs were obtained and reviewed to evaluate the vascular anatomy. RADIATION DOSE REDUCTION: This exam was performed according to the departmental dose-optimization program which includes automated exposure control, adjustment of the mA and/or kV according to patient size and/or use of iterative reconstruction technique. CONTRAST:  71m OMNIPAQUE IOHEXOL 350 MG/ML SOLN COMPARISON:  05/20/2019 FINDINGS: VASCULAR Aorta: Patent normal caliber throughout very mild infrarenal atherosclerotic calcifications. Celiac: Patent without evidence of aneurysm, dissection, vasculitis or significant stenosis. SMA: Patent without evidence of aneurysm,  dissection, vasculitis or significant stenosis. Renals: Early branching single right and dual left renal arteries are patent without evidence of aneurysm, dissection, vasculitis, fibromuscular dysplasia or significant stenosis. IMA: Patent without evidence of aneurysm, dissection, vasculitis or significant stenosis. Inflow: Patent without evidence of aneurysm, dissection, vasculitis or significant stenosis. Proximal Outflow: Bilateral common femoral and visualized portions of the superficial and profunda femoral arteries are patent without evidence of aneurysm, dissection, vasculitis or significant stenosis. Veins: The hepatic veins are poorly opacified, however appear patent. The intrahepatic portal veins, main vein, superior mesenteric vein, splenic vein, and inferior mesenteric vein is widely patent and normal in caliber. There is a large gastro renal shunt measuring up to 25 mm diameter at its junction with the left vein. Multiple large gastric varices are visualized. There are multiple smaller distal esophageal varices present. Mildly prominent left and posterior gastric veins. The renal veins are patent bilaterally with retroaortic left renal vein. There is compression of left renal vein by  the overlying aorta. No evidence of iliocaval thrombosis or anomaly. Moderate compression of left common iliac vein the overlying right common iliac artery. Review of the MIP images confirms the above findings. NON-VASCULAR Lower chest: No acute abnormality. Hepatobiliary: Mildly shrunken right lobe of the liver with left lobe hypertrophy. Diffuse macronodular contour. Scattered well-defined water density hypoattenuating rounded lesions throughout the hepatic parenchyma, the largest in segment 2 measuring up to 1.4 cm. The gallbladder is present and unremarkable. No intra or extrahepatic biliary ductal dilation. Pancreas: Unremarkable. No pancreatic ductal dilatation or surrounding inflammatory changes. Spleen: Measures up  to 11 cm in maximum craniocaudal dimension. No focal mass. Adrenals/Urinary Tract: Adrenal glands are unremarkable. Similar appearing scattered simple cysts, largest in left interpolar region measuring 0.9 cm. Kidneys are otherwise normal, without renal calculi, focal lesion, or hydronephrosis. Bladder is unremarkable. Stomach/Bowel: Multiple large varices within the gastric cardia. The stomach is otherwise within limits. Appendix appears normal. Mild diffuse colonic mural thickening without surrounding inflammatory changes. No evidence of bowel wall thickening, distention, or inflammatory changes. Lymphatic: No abdominopelvic lymphadenopathy. Reproductive: Prostate gland measures up to 5.3 cm in maximum axial dimension. Other: Trace ascites, most prominent the bilateral upper quadrants. Tiny fat containing umbilical hernia. Musculoskeletal: No acute or significant osseous findings. IMPRESSION: VASCULAR 1. Portal hypertension as evidenced by large gastrorenal shunt and multiple prominent gastroesophageal varices in addition to trace ascites and colonic portal enteropathy. 2.  Aortic Atherosclerosis (ICD10-I70.0). NON-VASCULAR 1. Morphologic changes of hepatic cirrhosis.  No hepatoma. 2. Prostatomegaly. Ruthann Cancer, MD [facility] Electronically Signed   By: Ruthann Cancer M.D.   On: 07/03/2022 10:46   DG CHEST PORT 1 VIEW  Result Date: 07/03/2022 CLINICAL DATA:  [id number] with ventilator dependent respiratory failure. EXAM: PORTABLE CHEST 1 VIEW COMPARISON:  Portable chest yesterday at 11:07 p.m. FINDINGS: 4:13 a.m. ETT interval insertion with tip 4.5 cm from the carina, mid tracheal. The cardiac size is normal. The mediastinum is normally outlined. No vascular congestion is seen. The lungs are clear. The sulci are sharp. The thoracic cage is intact. There are multiple overlying monitor wires. IMPRESSION: 1. No evidence of acute chest disease. 2. ETT  interval insertion with tip 4.5 cm from the carina. Electronically Signed   By: Telford Nab M.D.   On: 07/03/2022 04:28   DG Abd Acute W/Chest  Result Date: 07/02/2022 CLINICAL DATA:  Hematemesis EXAM: DG ABDOMEN ACUTE WITH 1 VIEW CHEST COMPARISON:  05/20/2019 FINDINGS: Cardiac shadow is within normal limits. The lungs are clear bilaterally. Scattered large and small bowel gas is noted. No obstructive changes are seen. No free air is noted. No bony abnormality is seen. IMPRESSION: No acute abnormality noted. Electronically Signed   By: Inez Catalina M.D.   On: 07/02/2022 23:25    Labs:  CBC: Recent Labs    03/18/22 1214 06/10/22 1230 07/02/22 2300 07/02/22 2310 07/03/22 0331  WBC 5.5 4.0 25.3*  --  18.7*  HGB 12.7* 12.8* 5.9* 7.5* 9.7*  HCT 36.3* 37.0* 17.7* 22.0* 27.4*  PLT 71* 39* 147*  --  53*    COAGS: Recent Labs    03/18/22 1214 07/02/22 2300 07/03/22 0331  INR 1.2 1.9* 2.3*    BMP: Recent Labs    03/11/22 1130 03/18/22 1214 06/10/22 1230 07/02/22 2300 07/02/22 2310  NA 140 145* 136 140 142  K 3.0* 3.5 3.4* 4.1 4.1  CL 102 104 103 112* 110  CO2 '27 26 23 '$ 15*  --  GLUCOSE 97 93 89 175* 166*  BUN <5* 5* <5* 23* 25*  CALCIUM 9.1 8.8 8.9 7.8*  --   CREATININE 0.58* 0.56* 0.70 0.78 0.60*  GFRNONAA >60  --  >60 >60  --     LIVER FUNCTION TESTS: Recent Labs    03/11/22 1130 03/18/22 1214 06/10/22 1230 07/02/22 2300  BILITOT 2.2* 1.6* 5.6* 2.4*  AST 138* 147* 59* 50*  ALT 37 35 21 17  ALKPHOS 92 127* 77 57  PROT 7.5 7.6 8.2* 5.1*  ALBUMIN 3.2* 3.6* 3.4* 2.1*    Assessment and Plan:  Mr. Gelpi, 51 year old male presented with gastric varices complicated by hematemesis secondary to portal hypertension.  Dr. Dwaine Gale has reviewed patient history and imaging and has approved a TIPS/BRTO procedure with general anesthesia.  MELD-Na 19/ MELD 3.0 20. Will arrange to speak with patient's next of kin for informed consent.  Case tentatively scheduled for 1pm on  07/04/22.    Thank you for this interesting consult.  I greatly enjoyed meeting Stephen Miranda and look forward to participating in their care.  A copy of this report was sent to the requesting provider on this date.  Electronically Signed: Pasty Spillers, PA 07/03/2022, 10:53 AM   I spent a total of 40 Minutes in face to face in clinical consultation, greater than 50% of which was counseling/coordinating care for gastric varices

## 2022-07-03 NOTE — ED Notes (Signed)
ED Provider made aware of BP 83/55 and is at bedside.

## 2022-07-03 NOTE — ED Notes (Signed)
Additional 100 mcg and '4mg'$  versed verbal order by MD Duwayne Heck

## 2022-07-03 NOTE — ED Notes (Addendum)
GI MD at bedside

## 2022-07-03 NOTE — ED Notes (Signed)
Per MD Duwayne Heck, blood to be rapidly infused via [place]

## 2022-07-03 NOTE — H&P (Signed)
NAME:  Stephen Miranda, MRN:  [id number], DOB:  [birth year], LOS: 0 ADMISSION DATE:  07/02/2022, CONSULTATION DATE: 07/03/22  REFERRING MD:  Mesner, CHIEF COMPLAINT:  GI bleed    History of Present Illness:  [name] is a 51 yo man with hx of etoh use (sober 4 years), hx of gastric duodenal and esophageal varices here  with hematemesis.  Began at noon.  Dizzy and weak.  EMS reported an estimated 1L blood on floor.   Hx of melena.   Initially tachycardic and hypotensive.   In ED given protonix infusion, octreotide, reglan, fentanyl 50 x 2, ctx.  +1L  3 unit blood given, 4th stopped due to possible local reaction.  Patient became more altered, more lethargic, hypotensive.  Responded to rapid infusion of blood (4 u and ffp).    Multiple large episodes bright red emesis, large volume, also large volume melena.   Pertinent  Medical History  Insomnia Elevated liver enzymes  Cirrhosis, etoh  Hx UGIB  Malignant melanoma hx   Smokes 1/2 ppd  x 20 y No etoh  currently  PMH:  Coreg, lasix, B vitamins, MVI  Significant Hospital Events: Including procedures, antibiotic start and stop dates in addition to other pertinent events     Interim History / Subjective:    Objective   Blood pressure (!) 93/51, pulse (!) 113, temperature 98.9 F (37.2 C), temperature source Temporal, resp. rate (!) 21, height '6\' 1"'$  (1.854 m), weight 90.7 kg, SpO2 100 %.       No intake or output data in the 24 hours ending 07/03/22 0137 Filed Weights   07/03/22 0019 07/03/22 0020  Weight: 102.1 kg 90.7 kg    Examination: General: intermittently sleepy, then awake and alert after tx  HENT: mouth crusted with blood, pale,  Lungs: CTAB  Cardiovascular: sinus tach (120) Abdomen: nt, nd, nbs  Extremities: non pitting edeam 1+  Neuro: Intermittently alert and oriented, int sleepy  GU:   Resolved Hospital Problem list     Assessment & Plan:  UGIB - Discussed with GI physician.  Given ongoing bleeding and need  for massive tx, plan for scope overnight.  Cont protonix for now, cont octreose Tx prn.   Hypoalb Lactic 8.5 WBC 25.3 - received ctx. No other signs of infection.  INR 1.9 Best Practice (right click and "Reselect all SmartList Selections" daily)   Diet/type: NPO DVT prophylaxis: not indicated GI prophylaxis: N/A Lines: N/A Foley:  N/A Code Status:  full code Last date of multidisciplinary goals of care discussion '[]'$   Labs   CBC: Recent Labs  Lab 07/02/22 2300 07/02/22 2310  WBC 25.3*  --   NEUTROABS 21.8*  --   HGB 5.9* 7.5*  HCT 17.7* 22.0*  MCV 106.6*  --   PLT 147*  --     Basic Metabolic Panel: Recent Labs  Lab 07/02/22 2300 07/02/22 2310  NA 140 142  K 4.1 4.1  CL 112* 110  CO2 15*  --   GLUCOSE 175* 166*  BUN 23* 25*  CREATININE 0.78 0.60*  CALCIUM 7.8*  --    GFR: Estimated Creatinine Clearance: 124.8 mL/min (A) (by C-G formula based on SCr of 0.6 mg/dL (L)). Recent Labs  Lab 07/02/22 2300  WBC 25.3*  LATICACIDVEN 8.5*    Liver Function Tests: Recent Labs  Lab 07/02/22 2300  AST 50*  ALT 17  ALKPHOS 57  BILITOT 2.4*  PROT 5.1*  ALBUMIN 2.1*   Recent Labs  Lab  07/02/22 2300  LIPASE 28   No results for input(s): "AMMONIA" in the last 168 hours.  ABG    Component Value Date/Time   TCO2 16 (L) 07/02/2022 2310     Coagulation Profile: Recent Labs  Lab 07/02/22 2300  INR 1.9*    Cardiac Enzymes: No results for input(s): "CKTOTAL", "CKMB", "CKMBINDEX", "TROPONINI" in the last 168 hours.  HbA1C: Hgb A1c MFr Bld  Date/Time Value Ref Range Status  12/28/2021 12:34 PM 4.9 4.8 - 5.6 % Final    Comment:             Prediabetes: 5.7 - 6.4          Diabetes: >6.4          Glycemic control for adults with diabetes: <7.0     CBG: No results for input(s): "GLUCAP" in the last 168 hours.  Review of Systems:    Past Medical History:  He,  has a past medical history of Chronic insomnia (05/23/2016), Elevated liver enzymes  (04/24/2015), Malignant melanoma of back ([place]) (02/07/2015), Restless legs (05/23/2016), and Seborrheic dermatitis (05/23/2016).   Surgical History:   Past Surgical History:  Procedure Laterality Date   ESOPHAGOGASTRODUODENOSCOPY (EGD) WITH PROPOFOL N/A 05/20/2019   Procedure: ESOPHAGOGASTRODUODENOSCOPY (EGD) WITH PROPOFOL;  Surgeon: Rush Landmark Telford Nab., MD;  Location: [facility] ENDOSCOPY;  Service: Gastroenterology;  Laterality: N/A;   Excision Malignant Melanoma Left 02/07/2015   Left upper back     Social History:   reports that he has been smoking cigarettes. He has a 10.00 pack-year smoking history. He has never used smokeless tobacco. He reports that he does not currently use alcohol after a past usage of about 4.0 standard drinks of alcohol per week. He reports that he does not use drugs.   Family History:  His family history includes Fibromyalgia in his mother.   Allergies No Known Allergies   Home Medications  Prior to Admission medications   Medication Sig Start Date End Date Taking? Authorizing Provider  B Complex Vitamins (B COMPLEX 1 PO) Take 1 tablet by mouth daily. Patient not taking: Reported on 07/02/2022    [provider]  carvedilol (COREG) 3.125 MG tablet Take 1 tablet (3.125 mg total) by mouth 2 (two) times daily with a meal. Patient not taking: Reported on 07/02/2022 12/28/21   Mack Hook, MD  furosemide (LASIX) 20 MG tablet Take 1 tablet (20 mg total) by mouth daily. Patient not taking: Reported on 06/10/2022 03/11/22   Rondel Oh, MD  gabapentin (NEURONTIN) 100 MG capsule 1 cap by mouth at bedtime Patient not taking: Reported on 03/18/2022 12/28/21   Mack Hook, MD  Multiple Vitamin (MULTIVITAMIN WITH MINERALS) TABS tablet Take 1 tablet by mouth daily. Patient not taking: Reported on 07/02/2022 05/23/19   Debbe Odea, MD     Critical care time: 60 min

## 2022-07-03 NOTE — Progress Notes (Signed)
GI UPDATE:  Received signout from Dr. Hilarie Fredrickson. Large gastric varices causing upper GI bleed. CT scan done, IR to do BRTO vs. TIPS. Continue IV octreotide drip and protonix for now Hgb goal > 7, it appears stable at this time after resuscitation. No significant bleeding per nurse staff. No hepatomas noted on CT.   We will reassess tomorrow AM, call with questions in the interim with any changes.   Jolly Mango, MD [facility]

## 2022-07-03 NOTE — ED Notes (Signed)
MD notified of critical lactic  

## 2022-07-03 NOTE — Op Note (Signed)
[facility] Patient Name: Stephen Miranda Procedure Date : 07/03/2022 MRN: [id number] Attending MD: Jerene Bears , MD, [id number] Date of Birth: 18-Jul-1971 CSN: [id number] Age: 51 Admit Type: Emergency Department Procedure:                Upper GI endoscopy Indications:              Hematemesis, Hematochezia, Active gastrointestinal                            bleeding, hx of portal HTN and esophageal and                            gastric varices Providers:                Lajuan Lines. Hilarie Fredrickson, MD, Cletis Athens, Technician Referring MD:             [facility] and Dr. Duwayne Heck PCCM Medicines:                Propofol and fentanyl per ICU team at bedside Complications:            No immediate complications. Estimated Blood Loss:     Estimated blood loss: none. Procedure:                Pre-Anesthesia Assessment:                           - Prior to the procedure, a History and Physical                            was performed, and patient medications and                            allergies were reviewed. The patient's tolerance of                            previous anesthesia was also reviewed. The risks                            and benefits of the procedure and the sedation                            options and risks were discussed with the patient.                            All questions were answered, and informed consent                            was obtained. Prior Anticoagulants: The patient has                            taken no anticoagulant or antiplatelet agents. ASA                            Grade Assessment: IV - A patient with severe  systemic disease that is a constant threat to life.                            After reviewing the risks and benefits, the patient                            was deemed in satisfactory condition to undergo the                            procedure.                           After obtaining  informed consent, the endoscope was                            passed under direct vision. Throughout the                            procedure, the patient's blood pressure, pulse, and                            oxygen saturations were monitored continuously. The                            [id number]-H190 ([id number]) Olympus endoscope was introduced                            through the mouth, and advanced to the second part                            of duodenum. The upper GI endoscopy was                            accomplished without difficulty. The patient                            tolerated the procedure well. Scope In: Scope Out: Findings:      Three columns of grade II varices with no bleeding and no stigmata of       recent bleeding were found in the lower third of the esophagus. They       were medium in size. No red wale signs were present.      There were esophageal mucosal changes suspicious for long-segment       Barrett's esophagus present in the lower third of the esophagus. The       maximum longitudinal extent of these mucosal changes was 5 cm in length.       Biopsy is contraindicated because the patient is hemodynamically       unstable.      A 2 cm hiatal hernia was present.      Type 2 gastroesophageal varices (GOV2, esophageal varices which extend       along the fundus) with no bleeding were found in the gastric fundus.       There were stigmata of recent bleeding. They were large in largest  diameter.      The exam of the stomach was otherwise normal.      The examined duodenum was normal. Impression:               - Grade II (medium-sized) esophageal varices with                            no bleeding and no stigmata of recent bleeding.                           - Esophageal mucosal changes suspicious for                            long-segment Barrett's esophagus. Biopsy is                            contraindicated.                           - 1-2 cm hiatal  hernia.                           - Type 2 gastroesophageal varices (GOV2, esophageal                            varices which extend along the fundus), without                            active bleeding, but WITH stigmata of recent                            bleeding. This is the source of recent                            hemodynamically significant GI bleeding.                           - Normal examined duodenum.                           - No specimens collected. Moderate Sedation:      N/A Recommendation:           - Return patient to ICU for ongoing care.                           - NPO.                           - Continue present medications including octreotide                            and PPI infusion.                           - Prophylactic antibiotics.                           -  I consulted and spoke with IR provider, Dr.                            Annamaria Boots. CT abdomen/pelvis angiography TIPS/BRTO                            protocol ordered STAT. BRTO versus TIPS likely best                            intervention for acute gastric variceal bleed.                           - GI consult team will follow.                           - Monitor Hgb, goal 7-8 g/dL and no higher (if                            stable) given variceal bleeding and desire to                            minimize portal venous pressure.                           - Discussed with patient's mother by phone after                            procedure. Procedure Code(s):        --- Professional ---                           [phone], Esophagogastroduodenoscopy, flexible,                            transoral; diagnostic, including collection of                            specimen(s) by brushing or washing, when performed                            (separate procedure) Diagnosis Code(s):        --- Professional ---                           I85.00, Esophageal varices without bleeding                            K22.89, Other specified disease of esophagus                           K44.9, Diaphragmatic hernia without obstruction or                            gangrene  I86.4, Gastric varices                           K92.0, Hematemesis                           K92.1, Melena (includes Hematochezia)                           K92.2, Gastrointestinal hemorrhage, unspecified CPT copyright 2022 [employer]. All rights reserved. The codes documented in this report are preliminary and upon coder review may  be revised to meet current compliance requirements. Jerene Bears, MD 07/03/2022 4:36:47 AM This report has been signed electronically. Number of Addenda: 0

## 2022-07-03 NOTE — Procedures (Signed)
Arterial Line Insertion Start/End12/13/2023 5:38 AM, 07/03/2022 5:50 AM  Patient location: ICU. Preanesthetic checklist: patient identified, monitors and equipment checked and timeout performed Right, radial was placed Catheter size: 20 G Hand hygiene performed  and maximum sterile barriers used  Allen's test indicative of satisfactory collateral circulation Attempts: 1 Procedure performed without using ultrasound guided technique. Following insertion, dressing applied and Biopatch. Post procedure assessment: normal and unchanged  Post procedure complications: second provider assisted. Patient tolerated the procedure well with no immediate complications.

## 2022-07-03 NOTE — ED Notes (Signed)
1 unit started on [place] by [employer]

## 2022-07-03 NOTE — ED Notes (Signed)
Intubated by MD Duwayne Heck

## 2022-07-03 NOTE — Procedures (Addendum)
Intubation Procedure Note  Stephen Miranda  [id number]  02/08/1971  Date:07/03/22  Time:7:04 AM   Provider Performing:Nazar Kuan Duwayne Heck    Procedure: Intubation (31500)  Indication(s) Respiratory Failure  Consent Risks of the procedure as well as the alternatives and risks of each were explained to the patient and/or caregiver.  Consent for the procedure was obtained and is signed in the bedside chart   Anesthesia Etomidate, Fentanyl, and Rocuronium   Time Out Verified patient identification, verified procedure, site/side was marked, verified correct patient position, special equipment/implants available, medications/allergies/relevant history reviewed, required imaging and test results available.   Sterile Technique Usual hand hygeine, masks, and gloves were used   Procedure Description Patient positioned in bed supine.  Sedation given as noted above.  Patient was intubated with endotracheal tube using Glidescope.  View was Grade 1 full glottis .  Number of attempts was 1.  Colorimetric CO2 detector was consistent with tracheal placement.   Complications/Tolerance None; patient tolerated the procedure well. Chest X-ray is ordered to verify placement.   EBL none   Specimen(s) None

## 2022-07-03 NOTE — ED Notes (Signed)
Pt pre oxygenated with NRB per MD Duwayne Heck

## 2022-07-04 ENCOUNTER — Inpatient Hospital Stay (HOSPITAL_COMMUNITY): Payer: Medicaid Other | Admitting: Certified Registered Nurse Anesthetist

## 2022-07-04 ENCOUNTER — Encounter (HOSPITAL_COMMUNITY)
Admission: EM | Disposition: A | Discharge status: Home / Self Care | Payer: Self-pay | Source: Home / Self Care | Attending: Internal Medicine

## 2022-07-04 ENCOUNTER — Inpatient Hospital Stay (HOSPITAL_COMMUNITY)
Admit: 2022-07-04 | Discharge: 2022-07-04 | Disposition: A | Discharge status: Home / Self Care | Payer: Medicaid Other | Attending: Physician Assistant | Admitting: Physician Assistant

## 2022-07-04 DIAGNOSIS — K920 Hematemesis: Secondary | ICD-10-CM

## 2022-07-04 DIAGNOSIS — F17200 Nicotine dependence, unspecified, uncomplicated: Secondary | ICD-10-CM

## 2022-07-04 DIAGNOSIS — L8990 Pressure ulcer of unspecified site, unspecified stage: Secondary | ICD-10-CM | POA: Insufficient documentation

## 2022-07-04 DIAGNOSIS — K7030 Alcoholic cirrhosis of liver without ascites: Secondary | ICD-10-CM

## 2022-07-04 DIAGNOSIS — R579 Shock, unspecified: Secondary | ICD-10-CM

## 2022-07-04 DIAGNOSIS — D649 Anemia, unspecified: Secondary | ICD-10-CM

## 2022-07-04 HISTORY — PX: RADIOLOGY WITH ANESTHESIA: SHX6223

## 2022-07-04 HISTORY — PX: IR ANGIOGRAM SELECTIVE EACH ADDITIONAL VESSEL: IMG667

## 2022-07-04 HISTORY — PX: IR EMBO ART  VEN HEMORR LYMPH EXTRAV  INC GUIDE ROADMAPPING: IMG5450

## 2022-07-04 HISTORY — PX: IR VENOGRAM RENAL UNI LEFT: IMG680

## 2022-07-04 HISTORY — PX: IR US GUIDE VASC ACCESS RIGHT: IMG2390

## 2022-07-04 LAB — CBC
HCT: 27.5 % — ABNORMAL LOW (ref 39.0–52.0)
HCT: 26.5 % — ABNORMAL LOW (ref 39.0–52.0)
HCT: 25 % — ABNORMAL LOW (ref 39.0–52.0)
Hemoglobin: 9.9 g/dL — ABNORMAL LOW (ref 13.0–17.0)
Hemoglobin: 9.5 g/dL — ABNORMAL LOW (ref 13.0–17.0)
Hemoglobin: 8.8 g/dL — ABNORMAL LOW (ref 13.0–17.0)
MCH: 32.1 pg (ref 26.0–34.0)
MCH: 33 pg (ref 26.0–34.0)
MCH: 32.6 pg (ref 26.0–34.0)
MCHC: 36 g/dL (ref 30.0–36.0)
MCHC: 35.8 g/dL (ref 30.0–36.0)
MCHC: 35.2 g/dL (ref 30.0–36.0)
MCV: 89.3 fL (ref 80.0–100.0)
MCV: 92 fL (ref 80.0–100.0)
MCV: 92.6 fL (ref 80.0–100.0)
Platelets: 108 10*3/uL — ABNORMAL LOW (ref 150–400)
Platelets: 110 10*3/uL — ABNORMAL LOW (ref 150–400)
Platelets: 98 10*3/uL — ABNORMAL LOW (ref 150–400)
RBC: 3.08 MIL/uL — ABNORMAL LOW (ref 4.22–5.81)
RBC: 2.88 MIL/uL — ABNORMAL LOW (ref 4.22–5.81)
RBC: 2.7 MIL/uL — ABNORMAL LOW (ref 4.22–5.81)
RDW: 19.2 % — ABNORMAL HIGH (ref 11.5–15.5)
RDW: 19.4 % — ABNORMAL HIGH (ref 11.5–15.5)
RDW: 19.9 % — ABNORMAL HIGH (ref 11.5–15.5)
WBC: 19.3 10*3/uL — ABNORMAL HIGH (ref 4.0–10.5)
WBC: 18.7 10*3/uL — ABNORMAL HIGH (ref 4.0–10.5)
WBC: 21.7 10*3/uL — ABNORMAL HIGH (ref 4.0–10.5)
nRBC: 0.1 % (ref 0.0–0.2)
nRBC: 0.1 % (ref 0.0–0.2)
nRBC: 0 % (ref 0.0–0.2)

## 2022-07-04 LAB — PREPARE FRESH FROZEN PLASMA

## 2022-07-04 LAB — BPAM FFP
Blood Product Expiration Date: 202312172359
ISSUE DATE / TIME: 202312130235
Unit Type and Rh: 6200

## 2022-07-04 LAB — BPAM PLATELET PHERESIS
Blood Product Expiration Date: 202312142359
Unit Type and Rh: 5100

## 2022-07-04 LAB — PREPARE PLATELET PHERESIS: Unit division: 0

## 2022-07-04 LAB — TRIGLYCERIDES: Triglycerides: 71 mg/dL (ref <150)

## 2022-07-04 LAB — COMPREHENSIVE METABOLIC PANEL
ALT: 19 U/L (ref 0–44)
AST: 50 U/L — ABNORMAL HIGH (ref 15–41)
Albumin: 2 g/dL — ABNORMAL LOW (ref 3.5–5.0)
Alkaline Phosphatase: 37 U/L — ABNORMAL LOW (ref 38–126)
Anion gap: 5 (ref 5–15)
BUN: 22 mg/dL — ABNORMAL HIGH (ref 6–20)
CO2: 21 mmol/L — ABNORMAL LOW (ref 22–32)
Calcium: 7.2 mg/dL — ABNORMAL LOW (ref 8.9–10.3)
Chloride: 114 mmol/L — ABNORMAL HIGH (ref 98–111)
Creatinine, Ser: 0.79 mg/dL (ref 0.61–1.24)
GFR, Estimated: >60 mL/min (ref >60)
Glucose, Bld: 129 mg/dL — ABNORMAL HIGH (ref 70–99)
Potassium: 3.8 mmol/L (ref 3.5–5.1)
Sodium: 140 mmol/L (ref 135–145)
Total Bilirubin: 3.2 mg/dL — ABNORMAL HIGH (ref 0.3–1.2)
Total Protein: 4.5 g/dL — ABNORMAL LOW (ref 6.5–8.1)

## 2022-07-04 LAB — GLUCOSE, CAPILLARY: Glucose-Capillary: 101 mg/dL — ABNORMAL HIGH (ref 70–99)

## 2022-07-04 LAB — PREPARE RBC (CROSSMATCH)

## 2022-07-04 LAB — PROTIME-INR
INR: 1.6 — ABNORMAL HIGH (ref 0.8–1.2)
Prothrombin Time: 19.1 seconds — ABNORMAL HIGH (ref 11.4–15.2)

## 2022-07-04 SURGERY — IR WITH ANESTHESIA
Anesthesia: General

## 2022-07-04 MED ORDER — LACTATED RINGERS IV SOLN: INTRAVENOUS | Status: DC | PRN

## 2022-07-04 MED ORDER — PROPOFOL 10 MG/ML IV BOLUS
INTRAVENOUS | Status: DC | PRN
  Administered 2022-07-04: 50 mg via INTRAVENOUS

## 2022-07-04 MED ORDER — ALBUMIN HUMAN 5 % IV SOLN: INTRAVENOUS | Status: DC | PRN

## 2022-07-04 MED ORDER — CALCIUM CHLORIDE 10 % IV SOLN
INTRAVENOUS | Status: DC | PRN
  Administered 2022-07-04: 1 g via INTRAVENOUS

## 2022-07-04 MED ORDER — CEFAZOLIN SODIUM-DEXTROSE 2-4 GM/100ML-% IV SOLN
INTRAVENOUS | Status: AC
  Filled 2022-07-04: qty 100

## 2022-07-04 MED ORDER — CEFAZOLIN SODIUM-DEXTROSE 2-3 GM-%(50ML) IV SOLR
INTRAVENOUS | Status: DC | PRN
  Administered 2022-07-04: 2 g via INTRAVENOUS

## 2022-07-04 MED ORDER — MIDAZOLAM HCL 2 MG/2ML IJ SOLN
INTRAMUSCULAR | Status: AC
  Filled 2022-07-04: qty 2

## 2022-07-04 MED ORDER — MIDAZOLAM HCL 5 MG/5ML IJ SOLN
INTRAMUSCULAR | Status: DC | PRN
  Administered 2022-07-04: 2 mg via INTRAVENOUS

## 2022-07-04 MED ORDER — IOHEXOL 300 MG/ML  SOLN
100.0000 mL | Freq: Once | INTRAMUSCULAR | Status: AC | PRN
  Administered 2022-07-04: 15 mL via INTRAVENOUS

## 2022-07-04 MED ORDER — ROCURONIUM BROMIDE 10 MG/ML (PF) SYRINGE
PREFILLED_SYRINGE | INTRAVENOUS | Status: DC | PRN
  Administered 2022-07-04 (×2): 50 mg via INTRAVENOUS

## 2022-07-04 MED ORDER — IOHEXOL 300 MG/ML  SOLN
100.0000 mL | Freq: Once | INTRAMUSCULAR | Status: AC | PRN
  Administered 2022-07-04: 50 mL via INTRAVENOUS

## 2022-07-04 NOTE — Sedation Documentation (Signed)
Neurovascular assessment completed pre procedure- see duplicate entry below

## 2022-07-04 NOTE — TOC CM/SW Note (Signed)
..   Transition of Care [facility]) Screening Note   Patient Details  Name: Stephen Miranda Date of Birth: 1970-10-01   Transition of Care [employer] [facility]) CM/SW Contact:    Erenest Rasher, RN Phone Number: [phone] 07/04/2022, 5:46 PM    Transition of Care Department [facility]) has reviewed patient.  We will continue to monitor patient advancement through interdisciplinary progression rounds. Will continue to follow for d/c needs.

## 2022-07-04 NOTE — Procedures (Addendum)
Interventional Radiology Procedure Note  Procedure: successful BRTO for bleeding GVs    Complications: None  Estimated Blood Loss:  min  Findings: Large gastrorenal shunt accessed successfully for BRTO with foam sclero and large coils.  Full report in pacs  Will need f/u CTA (TIPS/BRTO protocol) in 48hrs to assess GVs for complete occlusion on Saturday.      Tamera Punt, MD

## 2022-07-04 NOTE — Progress Notes (Signed)
Discussed risks/benefits of BRTO, TIPS with mother yesterday and she was agreeable to proceed with procedure.  She is unfortunately unable to be reached this am but based on my discussions with her and patient it is reasonable to proceed with this life-saving intervention.  Erskine Emery MD PCCM

## 2022-07-04 NOTE — Progress Notes (Signed)
Progress Note   Subjective  Some small amount of red blood in the rectal tube but not significant. Has been on levophed with escalating doses this AM per nursing. CTA done yesterday. BRTO/ TIPS scheduled for 1 PM today with IR.   Objective   Vital signs in last 24 hours: Temp:  [97.7 F (36.5 C)-98.8 F (37.1 C)] 98 F (36.7 C) (12/14 0348) Pulse Rate:  [79-94] 85 (12/14 0700) Resp:  [14-21] 18 (12/14 0700) BP: (84-136)/(51-72) 102/58 (12/14 0700) SpO2:  [97 %-100 %] 100 % (12/14 0700) Arterial Line BP: (98-129)/(45-62) 108/48 (12/14 0700) FiO2 (%):  [40 %] 40 % (12/14 0357) Weight:  [84.8 kg] 84.8 kg (12/14 0500) Last BM Date : 07/03/22 General:    white male iintubated but awake, nods to questions in response Abdomen:  Soft, nontender and nondistended.   Intake/Output from previous day: 12/13 0701 - 12/14 0700 In: 1484.8 [I.V.:1384.8; IV Piggyback:100] Out: 800 [Urine:800] Intake/Output this shift: No intake/output data recorded.  Lab Results: Recent Labs    07/03/22 1037 07/03/22 1938 07/04/22 0420  WBC 24.0* 20.3* 19.3*  HGB 10.5* 10.5* 9.9*  HCT 28.5* 29.1* 27.5*  PLT 93* 114* 108*   BMET Recent Labs    07/02/22 2300 07/02/22 2310 07/04/22 0420  NA 140 142 140  K 4.1 4.1 3.8  CL 112* 110 114*  CO2 15*  --  21*  GLUCOSE 175* 166* 129*  BUN 23* 25* 22*  CREATININE 0.78 0.60* 0.79  CALCIUM 7.8*  --  7.2*   LFT Recent Labs    07/04/22 0420  PROT 4.5*  ALBUMIN 2.0*  AST 50*  ALT 19  ALKPHOS 37*  BILITOT 3.2*   PT/INR Recent Labs    07/03/22 0331 07/04/22 0420  LABPROT 25.4* 19.1*  INR 2.3* 1.6*    Studies/Results: CT Angio Abd/Pel w/ and/or w/o  Result Date: 07/03/2022 CLINICAL DATA:  51 year old male with history of portal hypertension and hematemesis. EXAM: CTA ABDOMEN AND PELVIS WITHOUT AND WITH CONTRAST TECHNIQUE: Multidetector CT imaging of the abdomen and pelvis was performed using the standard protocol during bolus  administration of intravenous contrast. Multiplanar reconstructed images and MIPs were obtained and reviewed to evaluate the vascular anatomy. RADIATION DOSE REDUCTION: This exam was performed according to the departmental dose-optimization program which includes automated exposure control, adjustment of the mA and/or kV according to patient size and/or use of iterative reconstruction technique. CONTRAST:  38m OMNIPAQUE IOHEXOL 350 MG/ML SOLN COMPARISON:  05/20/2019 FINDINGS: VASCULAR Aorta: Patent normal caliber throughout very mild infrarenal atherosclerotic calcifications. Celiac: Patent without evidence of aneurysm, dissection, vasculitis or significant stenosis. SMA: Patent without evidence of aneurysm, dissection, vasculitis or significant stenosis. Renals: Early branching single right and dual left renal arteries are patent without evidence of aneurysm, dissection, vasculitis, fibromuscular dysplasia or significant stenosis. IMA: Patent without evidence of aneurysm, dissection, vasculitis or significant stenosis. Inflow: Patent without evidence of aneurysm, dissection, vasculitis or significant stenosis. Proximal Outflow: Bilateral common femoral and visualized portions of the superficial and profunda femoral arteries are patent without evidence of aneurysm, dissection, vasculitis or significant stenosis. Veins: The hepatic veins are poorly opacified, however appear patent. The intrahepatic portal veins, main vein, superior mesenteric vein, splenic vein, and inferior mesenteric vein is widely patent and normal in caliber. There is a large gastro renal shunt measuring up to 25 mm diameter at its junction with the left vein. Multiple large gastric varices are visualized. There are multiple smaller distal esophageal varices  present. Mildly prominent left and posterior gastric veins. The renal veins are patent bilaterally with retroaortic left renal vein. There is compression of left renal vein by the overlying  aorta. No evidence of iliocaval thrombosis or anomaly. Moderate compression of left common iliac vein the overlying right common iliac artery. Review of the MIP images confirms the above findings. NON-VASCULAR Lower chest: No acute abnormality. Hepatobiliary: Mildly shrunken right lobe of the liver with left lobe hypertrophy. Diffuse macronodular contour. Scattered well-defined water density hypoattenuating rounded lesions throughout the hepatic parenchyma, the largest in segment 2 measuring up to 1.4 cm. The gallbladder is present and unremarkable. No intra or extrahepatic biliary ductal dilation. Pancreas: Unremarkable. No pancreatic ductal dilatation or surrounding inflammatory changes. Spleen: Measures up to 11 cm in maximum craniocaudal dimension. No focal mass. Adrenals/Urinary Tract: Adrenal glands are unremarkable. Similar appearing scattered simple cysts, largest in left interpolar region measuring 0.9 cm. Kidneys are otherwise normal, without renal calculi, focal lesion, or hydronephrosis. Bladder is unremarkable. Stomach/Bowel: Multiple large varices within the gastric cardia. The stomach is otherwise within limits. Appendix appears normal. Mild diffuse colonic mural thickening without surrounding inflammatory changes. No evidence of bowel wall thickening, distention, or inflammatory changes. Lymphatic: No abdominopelvic lymphadenopathy. Reproductive: Prostate gland measures up to 5.3 cm in maximum axial dimension. Other: Trace ascites, most prominent the bilateral upper quadrants. Tiny fat containing umbilical hernia. Musculoskeletal: No acute or significant osseous findings. IMPRESSION: VASCULAR 1. Portal hypertension as evidenced by large gastrorenal shunt and multiple prominent gastroesophageal varices in addition to trace ascites and colonic portal enteropathy. 2.  Aortic Atherosclerosis (ICD10-I70.0). NON-VASCULAR 1. Morphologic changes of hepatic cirrhosis.  No hepatoma. 2. Prostatomegaly. Ruthann Cancer, MD [facility] Radiology Electronically Signed   By: Ruthann Cancer M.D.   On: 07/03/2022 10:46   DG CHEST PORT 1 VIEW  Result Date: 07/03/2022 CLINICAL DATA:  [id number] with ventilator dependent respiratory failure. EXAM: PORTABLE CHEST 1 VIEW COMPARISON:  Portable chest yesterday at 11:07 p.m. FINDINGS: 4:13 a.m. ETT interval insertion with tip 4.5 cm from the carina, mid tracheal. The cardiac size is normal. The mediastinum is normally outlined. No vascular congestion is seen. The lungs are clear. The sulci are sharp. The thoracic cage is intact. There are multiple overlying monitor wires. IMPRESSION: 1. No evidence of acute chest disease. 2. ETT interval insertion with tip 4.5 cm from the carina. Electronically Signed   By: Telford Nab M.D.   On: 07/03/2022 04:28   DG Abd Acute W/Chest  Result Date: 07/02/2022 CLINICAL DATA:  Hematemesis EXAM: DG ABDOMEN ACUTE WITH 1 VIEW CHEST COMPARISON:  05/20/2019 FINDINGS: Cardiac shadow is within normal limits. The lungs are clear bilaterally. Scattered large and small bowel gas is noted. No obstructive changes are seen. No free air is noted. No bony abnormality is seen. IMPRESSION: No acute abnormality noted. Electronically Signed   By: Inez Catalina M.D.   On: 07/02/2022 23:25       Assessment / Plan:    51 y/o male admitted with the following:  Severe upper GI bleed secondary to gastric varices EtOH cirrhosis  Significant bleeding 2 nights prior leading to massive transfusion protocol. Urgent EGD identified source of bleeding to be gastric varices, stigmata of recent bleeding.  Despite no intervention on these yet, he has remarkably stabilized with octreotide and PPI.  His hemoglobin has been relatively stable.  CTA performed, IR has evaluated, plans to take him for BRTO/TIPS today at 1 PM.  Patient  is awake on the vent although requiring some pressor support.  He does have some red blood in  the rectal tube although certainly has slowed down compared to previous.  I explained to him what happened and interventions needed today, he nods his head in response.  Continue octreotide and IV PPI at this time, he is on ceftriaxone.  Keep hemoglobin greater than 7.  PLAN: - BRTO / TIPS today per IR - continue octreotide - continue protonix - monitor Hgb, keep > 7 - wean pressors as tolerated - defer to primary service about timing of extubation, may wait until after IR procedure  Will follow, call with questions.  Jolly Mango, MD [facility] Gastroenterology

## 2022-07-04 NOTE — Anesthesia Preprocedure Evaluation (Addendum)
Anesthesia Evaluation  Patient identified by MRN, date of birth, ID band Patient unresponsive    Reviewed: Allergy & Precautions, Patient's Chart, lab work & pertinent test results  History of Anesthesia Complications Negative for: history of anesthetic complications  Airway Mallampati: Intubated       Dental   Pulmonary Current Smoker and Patient abstained from smoking.   + rhonchi        Cardiovascular negative cardio ROS  Rhythm:Regular Rate:Normal     Neuro/Psych Peripheral neuropathy  Neuromuscular disease    GI/Hepatic negative GI ROS,,,(+) Cirrhosis   Esophageal Varices and ascites    GI bleed   Endo/Other  negative endocrine ROS    Renal/GU negative Renal ROS     Musculoskeletal negative musculoskeletal ROS (+)    Abdominal   Peds  Hematology  (+) Blood dyscrasia, anemia   Anesthesia Other Findings   Reproductive/Obstetrics                             Anesthesia Physical Anesthesia Plan  ASA: 4  Anesthesia Plan: General   Post-op Pain Management: Minimal or no pain anticipated   Induction: Intravenous  PONV Risk Score and Plan: 1 and Ondansetron  Airway Management Planned: Oral ETT  Additional Equipment:   Intra-op Plan:   Post-operative Plan: Extubation in OR  Informed Consent: I have reviewed the patients History and Physical, chart, labs and discussed the procedure including the risks, benefits and alternatives for the proposed anesthesia with the patient or authorized representative who has indicated his/her understanding and acceptance.     Dental advisory given  Plan Discussed with: Anesthesiologist, CRNA and Surgeon  Anesthesia Plan Comments:         Anesthesia Quick Evaluation

## 2022-07-04 NOTE — Anesthesia Postprocedure Evaluation (Signed)
Anesthesia Post Note  Patient: Stephen Miranda  Procedure(s) Performed: IR WITH ANESTHESIA - TIPS     Patient location during evaluation: SICU Anesthesia Type: General Level of consciousness: sedated Pain management: pain level controlled Vital Signs Assessment: post-procedure vital signs reviewed and stable Respiratory status: patient remains intubated per anesthesia plan Cardiovascular status: stable Postop Assessment: no apparent nausea or vomiting Anesthetic complications: no   No notable events documented.  Last Vitals:  Vitals:   07/04/22 1150 07/04/22 1646  BP:    Pulse:    Resp:    Temp: 37.6 C   SpO2:  100%    Last Pain:  Vitals:   07/04/22 1150  TempSrc: Oral  PainSc:                  [place]

## 2022-07-04 NOTE — Progress Notes (Signed)
Inform by RN that pt mother at bedside. Came to floor with Dr. Dwaine Gale to discuss BRTO/TIPS procedures in detail. Again pt intubated but awake and alert enough to understand conversation.  Risks and benefits of TIPS, BRTO and/or additional variceal embolization were discussed with the patient and/or the patient's family including, but not limited to, infection, bleeding, damage to adjacent structures, worsening hepatic and/or cardiac function, worsening and/or the development of altered mental status/encephalopathy, non-target embolization and death.   This interventional procedure involves the use of X-rays and because of the nature of the planned procedure, it is possible that we will have prolonged use of X-ray fluoroscopy.  Potential radiation risks to you include (but are not limited to) the following: - A slightly elevated risk for cancer  several years later in life. This risk is typically less than 0.5% percent. This risk is low in comparison to the normal incidence of human cancer, which is 33% for women and 50% for men according to the [place]. - Radiation induced injury can include skin redness, resembling a rash, tissue breakdown / ulcers and hair loss (which can be temporary or permanent).   The likelihood of either of these occurring depends on the difficulty of the procedure and whether you are sensitive to radiation due to previous procedures, disease, or genetic conditions.   IF your procedure requires a prolonged use of radiation, you will be notified and given written instructions for further action.  It is your responsibility to monitor the irradiated area for the 2 weeks following the procedure and to notify your physician if you are concerned that you have suffered a radiation induced injury.    All of the patient's questions were answered, patient is agreeable to proceed.  Consent signed and in chart.  Ascencion Dike PA-C Interventional  Radiology 07/04/2022 11:11 AM

## 2022-07-04 NOTE — H&P (Signed)
   NAME:  Stephen Miranda, MRN:  [id number], DOB:  Dec 24, 1970, LOS: 1 ADMISSION DATE:  07/02/2022, CONSULTATION DATE: 07/03/22  REFERRING MD:  Mesner, CHIEF COMPLAINT:  GI bleed    History of Present Illness:  Stephen Miranda is a 51 yo man with hx of etoh use (sober 4 years), hx of gastric duodenal and esophageal varices here  with hematemesis.  Began at noon.  Dizzy and weak.  EMS reported an estimated 1L blood on floor.   Hx of melena.   Initially tachycardic and hypotensive.   In ED given protonix infusion, octreotide, reglan, fentanyl 50 x 2, ctx.  +1L  3 unit blood given, 4th stopped due to possible local reaction.  Patient became more altered, more lethargic, hypotensive.  Responded to rapid infusion of blood (4 u and ffp).    Multiple large episodes bright red emesis, large volume, also large volume melena.   Pertinent  Medical History  Insomnia Elevated liver enzymes  Cirrhosis, etoh  Hx UGIB  Malignant melanoma hx   Smokes 1/2 ppd  x 20 y No etoh  currently  PMH:  Coreg, lasix, B vitamins, MVI  Significant Hospital Events: Including procedures, antibiotic start and stop dates in addition to other pertinent events     Interim History / Subjective:  Comfortable on vent. Some old blood coming out of rectal tube.  Objective   Blood pressure (!) 103/56, pulse 85, temperature 99.3 F (37.4 C), temperature source Oral, resp. rate 18, height '6\' 1"'$  (1.854 m), weight 84.8 kg, SpO2 100 %.    Vent Mode: PRVC FiO2 (%):  [40 %] 40 % Set Rate:  [18 bmp] 18 bmp Vt Set:  [640 mL] 640 mL PEEP:  [5 cmH20] 5 cmH20 Plateau Pressure:  [12 cmH20-15 cmH20] 15 cmH20   Intake/Output Summary (Last 24 hours) at 07/04/2022 1011 Last data filed at 07/04/2022 0800 Gross per 24 hour  Intake 1921.4 ml  Output 1400 ml  Net 521.4 ml   Filed Weights   07/03/22 0019 07/03/22 0020 07/04/22 0500  Weight: 102.1 kg 90.7 kg 84.8 kg    Examination: No distress Lungs clear Heart sounds regular, ext  warm No edema Moves all 4 ext to command RASS 0  Hgb stable Plts stable WBC elevated but stable BMP looks okay Bili up slightly INR ok  Resolved Hospital Problem list     Assessment & Plan:  Recurrent gastric variceal bleeding- for IR today, leaving intubated for airway protection. - Vent and VAP prevention bundle - TIPS vs. BRTO today - Continue octreotide, PPI - Levophed for MAP 65, hopefully can wean as sedation wears off - SBT and likely extubation after IR intervention  Best Practice (right click and "Reselect all SmartList Selections" daily)   Diet/type: NPO DVT prophylaxis: not indicated GI prophylaxis: N/A Lines: N/A Foley:  N/A Code Status:  full code Last date of multidisciplinary goals of care discussion [spoke with mother bedside 07/03/22]  31 min cc time Erskine Emery MD PCCM

## 2022-07-04 NOTE — Sedation Documentation (Signed)
Embolization in progress

## 2022-07-04 NOTE — Transfer of Care (Signed)
Immediate Anesthesia Transfer of Care Note  Patient: Stephen Miranda  Procedure(s) Performed: IR WITH ANESTHESIA - TIPS  Patient Location: SICU  Anesthesia Type:General  Level of Consciousness: Patient remains intubated per anesthesia plan  Airway & Oxygen Therapy: Patient remains intubated per anesthesia plan and Patient placed on Ventilator (see vital sign flow sheet for setting)  Post-op Assessment: Report given to RN and Post -op Vital signs reviewed and stable  Post vital signs: Reviewed and stable  Last Vitals:  Vitals Value Taken Time  BP    Temp    Pulse 94 07/04/22 1651  Resp 29 07/04/22 1651  SpO2 100 % 07/04/22 1651  Vitals shown include unvalidated device data.  Last Pain:  Vitals:   07/04/22 1150  TempSrc: Oral  PainSc:          Complications: No notable events documented.

## 2022-07-04 NOTE — Sedation Documentation (Signed)
Arrived to IR suite with anesthesia, see anesthesia record for asessment and monitoring. Blood bank contacted, blood dispensed and en route to IR suite.

## 2022-07-04 NOTE — Sedation Documentation (Signed)
Coils deployed.

## 2022-07-04 NOTE — H&P (Signed)
HPI: Same day H&P for procedure today. Pt remains in ICU, intubated. Some dark blood in rectal tube. Have not been able to reach family despite numerous phone call attempts. Apparently mother was at bedside yesterday and spoke at length with PCCM team and is aware of plans for TIPS/BRTO. Chart, labs reviewed.  Medications: No current facility-administered medications for this encounter. No current outpatient medications on file.  Facility-Administered Medications Ordered in Other Encounters:    0.9 %  sodium chloride infusion (Manually program via Guardrails IV Fluids), , Intravenous, Once, Godfrey Pick, MD   0.9 %  sodium chloride infusion (Manually program via Guardrails IV Fluids), , Intravenous, Once, Mesner, Corene Cornea, MD   0.9 %  sodium chloride infusion (Manually program via Guardrails IV Fluids), , Intravenous, Once, Collier Bullock, MD   0.9 %  sodium chloride infusion (Manually program via Guardrails IV Fluids), , Intravenous, Once, Collier Bullock, MD   0.9 %  sodium chloride infusion, , Intravenous, Continuous, Pyrtle, Lajuan Lines, MD   Place/Maintain arterial line, , , Until Discontinued **AND** 0.9 %  sodium chloride infusion, , Intra-arterial, PRN, Collier Bullock, MD   0.9 %  sodium chloride infusion, , Intravenous, PRN, Candee Furbish, MD, Stopped at 07/03/22 2328   cefTRIAXone (ROCEPHIN) 1 g in sodium chloride 0.9 % 100 mL IVPB, 1 g, Intravenous, Q24H, Candee Furbish, MD, Stopped at 07/03/22 2255   Chlorhexidine Gluconate Cloth 2 % PADS 6 each, 6 each, Topical, Daily, Candee Furbish, MD   docusate (COLACE) 50 MG/5ML liquid 100 mg, 100 mg, Per Tube, BID, Candee Furbish, MD   docusate sodium (COLACE) capsule 100 mg, 100 mg, Oral, BID PRN, Collier Bullock, MD   fentaNYL (SUBLIMAZE) injection 50 mcg, 50 mcg, Intravenous, Q15 min PRN, Candee Furbish, MD   fentaNYL (SUBLIMAZE) injection 50-200 mcg, 50-200 mcg, Intravenous, Q30 min PRN, Candee Furbish, MD   fentaNYL 2535mg in  NS 2564m(1052mml) infusion-PREMIX, 0-200 mcg/hr, Intravenous, Titrated, SomAnders SimmondsD, Last Rate: 2.5 mL/hr at 07/04/22 0800, 25 mcg/hr at 07/04/22 0800   midazolam (VERSED) 100 mg/100 mL (1 mg/mL) premix infusion, 0.5-10 mg/hr, Intravenous, Continuous, GonCollier BullockD   norepinephrine (LEVOPHED) '4mg'$  in 250m48m.016 mg/mL) premix infusion, 0-20 mcg/min, Intravenous, Titrated, GonzCollier Bullock, Last Rate: 45 mL/hr at 07/04/22 0815, 12 mcg/min at 07/04/22 0815   [COMPLETED] octreotide (SANDOSTATIN) 2 mcg/mL load via infusion 50 mcg, 50 mcg, Intravenous, Once, 50 mcg at 07/03/22 0012 **AND** octreotide (SANDOSTATIN) 500 mcg in sodium chloride 0.9 % 250 mL (2 mcg/mL) infusion, 50 mcg/hr, Intravenous, Continuous, Dixon, RyanThurmond Butts, Last Rate: 25 mL/hr at 07/04/22 0800, 50 mcg/hr at 07/04/22 0800   ondansetron (ZOFRAN) injection 4 mg, 4 mg, Intravenous, Q6H PRN, GonzCollier Bullock   [STADerrill Memo12/16/2023] pantoprazole (PROTONIX) injection 40 mg, 40 mg, Intravenous, Q12H, DixoGodfrey Pick   pantoprozole (PROTONIX) 80 mg /NS 100 mL infusion, 8 mg/hr, Intravenous, Continuous, DixoGodfrey Pick, Last Rate: 10 mL/hr at 07/04/22 0800, 8 mg/hr at 07/04/22 0800   polyethylene glycol (MIRALAX / GLYCOLAX) packet 17 g, 17 g, Oral, Daily PRN, GonzCollier Bullock   polyethylene glycol (MIRALAX / GLYCOLAX) packet 17 g, 17 g, Per Tube, Daily, SmitCandee Furbish   prochlorperazine (COMPAZINE) injection 10 mg, 10 mg, Intravenous, Once, Mesner, JasoCorene Cornea   propofol (DIPRIVAN) 1000 MG/100ML infusion, 0-50 mcg/kg/min, Intravenous, Continuous, SmitCandee Furbish, Last Rate: 10.88 mL/hr at 07/04/22 0812, 20 mcg/kg/min at 07/04/22 [id number]  Physical Exam Vital signs in last 24 hours: Temp: 98 F  Pulse Rate:  85  Resp:  18  BP: 102/58  SpO2:  100 %  Intubated but awake. Seems to nod appropriately. Lungs: CTA Heart: regular Abd: soft, NT, ND   Labs:  CBC: Recent Labs    07/03/22 0331  07/03/22 1037 07/03/22 1938 07/04/22 0420  WBC 18.7* 24.0* 20.3* 19.3*  HGB 9.7* 10.5* 10.5* 9.9*  HCT 27.4* 28.5* 29.1* 27.5*  PLT 53* 93* 114* 108*    COAGS: Recent Labs    03/18/22 1214 07/02/22 2300 07/03/22 0331 07/04/22 0420  INR 1.2 1.9* 2.3* 1.6*    BMP: Recent Labs    03/11/22 1130 03/18/22 1214 06/10/22 1230 07/02/22 2300 07/02/22 2310 07/04/22 0420  NA 140 145* 136 140 142 140  K 3.0* 3.5 3.4* 4.1 4.1 3.8  CL 102 104 103 112* 110 114*  CO2 '27 26 23 '$ 15*  --  21*  GLUCOSE 97 93 89 175* 166* 129*  BUN <5* 5* <5* 23* 25* 22*  CALCIUM 9.1 8.8 8.9 7.8*  --  7.2*  CREATININE 0.58* 0.56* 0.70 0.78 0.60* 0.79  GFRNONAA >60  --  >60 >60  --  >60    LIVER FUNCTION TESTS: Recent Labs    03/18/22 1214 06/10/22 1230 07/02/22 2300 07/04/22 0420  BILITOT 1.6* 5.6* 2.4* 3.2*  AST 147* 59* 50* 50*  ALT 35 '21 17 19  '$ ALKPHOS 127* 77 57 37*  PROT 7.6 8.2* 5.1* 4.5*  ALBUMIN 3.6* 3.4* 2.1* 2.0*    Assessment/Plan:  Severe UGI bleed secondary to gastroesophageal varices. Underlying cirrhosis and portal HTN Plan to proceed with TIPS/BRTO procedure today. Procedure was discussed in detail with pt this am. He nods appropriately and seems to understand necessity of procedure. Again, unable to reach mother directly to discuss procedure and obtain written consent. Given circumstances of high risk for recurrent life threatening bleeding, feel procedure is medically necessary and emergent.   SignedAscencion Dike 07/04/2022, 8:56 AM

## 2022-07-05 ENCOUNTER — Telehealth: Payer: Self-pay

## 2022-07-05 ENCOUNTER — Encounter (HOSPITAL_COMMUNITY): Payer: Self-pay | Admitting: Radiology

## 2022-07-05 ENCOUNTER — Inpatient Hospital Stay (HOSPITAL_COMMUNITY): Payer: Medicaid Other

## 2022-07-05 LAB — CBC
HCT: 22.8 % — ABNORMAL LOW (ref 39.0–52.0)
HCT: 20.4 % — ABNORMAL LOW (ref 39.0–52.0)
Hemoglobin: 7.5 g/dL — ABNORMAL LOW (ref 13.0–17.0)
Hemoglobin: 6.8 g/dL — CL (ref 13.0–17.0)
MCH: 32.2 pg (ref 26.0–34.0)
MCH: 33.2 pg (ref 26.0–34.0)
MCHC: 32.9 g/dL (ref 30.0–36.0)
MCHC: 33.3 g/dL (ref 30.0–36.0)
MCV: 97.9 fL (ref 80.0–100.0)
MCV: 99.5 fL (ref 80.0–100.0)
Platelets: 66 10*3/uL — ABNORMAL LOW (ref 150–400)
Platelets: 47 10*3/uL — ABNORMAL LOW (ref 150–400)
RBC: 2.33 MIL/uL — ABNORMAL LOW (ref 4.22–5.81)
RBC: 2.05 MIL/uL — ABNORMAL LOW (ref 4.22–5.81)
RDW: 20.2 % — ABNORMAL HIGH (ref 11.5–15.5)
RDW: 20.5 % — ABNORMAL HIGH (ref 11.5–15.5)
WBC: 13.4 10*3/uL — ABNORMAL HIGH (ref 4.0–10.5)
WBC: 9.1 10*3/uL (ref 4.0–10.5)
nRBC: 0 % (ref 0.0–0.2)
nRBC: 0 % (ref 0.0–0.2)

## 2022-07-05 LAB — COMPREHENSIVE METABOLIC PANEL
ALT: 18 U/L (ref 0–44)
AST: 41 U/L (ref 15–41)
Albumin: 2.6 g/dL — ABNORMAL LOW (ref 3.5–5.0)
Alkaline Phosphatase: 34 U/L — ABNORMAL LOW (ref 38–126)
Anion gap: 7 (ref 5–15)
BUN: 17 mg/dL (ref 6–20)
CO2: 22 mmol/L (ref 22–32)
Calcium: 7.7 mg/dL — ABNORMAL LOW (ref 8.9–10.3)
Chloride: 109 mmol/L (ref 98–111)
Creatinine, Ser: 0.83 mg/dL (ref 0.61–1.24)
GFR, Estimated: >60 mL/min (ref >60)
Glucose, Bld: 118 mg/dL — ABNORMAL HIGH (ref 70–99)
Potassium: 3.6 mmol/L (ref 3.5–5.1)
Sodium: 138 mmol/L (ref 135–145)
Total Bilirubin: 3.1 mg/dL — ABNORMAL HIGH (ref 0.3–1.2)
Total Protein: 4.8 g/dL — ABNORMAL LOW (ref 6.5–8.1)

## 2022-07-05 LAB — PROTIME-INR
INR: 1.8 — ABNORMAL HIGH (ref 0.8–1.2)
Prothrombin Time: 20.3 seconds — ABNORMAL HIGH (ref 11.4–15.2)

## 2022-07-05 LAB — HEMOGLOBIN AND HEMATOCRIT, BLOOD
HCT: 21.4 % — ABNORMAL LOW (ref 39.0–52.0)
Hemoglobin: 7.3 g/dL — ABNORMAL LOW (ref 13.0–17.0)

## 2022-07-05 LAB — PREPARE RBC (CROSSMATCH)

## 2022-07-05 LAB — PHOSPHORUS: Phosphorus: 2.9 mg/dL (ref 2.5–4.6)

## 2022-07-05 MED ORDER — ALBUMIN HUMAN 25 % IV SOLN
50.0000 g | Freq: Once | INTRAVENOUS | Status: AC
  Administered 2022-07-05: 50 g via INTRAVENOUS
  Filled 2022-07-05: qty 200

## 2022-07-05 MED ORDER — IOHEXOL 350 MG/ML SOLN
75.0000 mL | Freq: Once | INTRAVENOUS | Status: AC | PRN
  Administered 2022-07-05: 75 mL via INTRAVENOUS

## 2022-07-05 MED ORDER — SODIUM CHLORIDE 0.9% IV SOLUTION: Freq: Once | INTRAVENOUS | Status: DC

## 2022-07-05 MED ORDER — PANTOPRAZOLE SODIUM 40 MG IV SOLR
40.0000 mg | Freq: Two times a day (BID) | INTRAVENOUS | Status: DC
  Administered 2022-07-05 – 2022-07-08 (×7): 40 mg via INTRAVENOUS
  Filled 2022-07-05 (×7): qty 10

## 2022-07-05 MED ORDER — ADULT MULTIVITAMIN W/MINERALS CH
1.0000 | ORAL_TABLET | Freq: Every day | ORAL | Status: DC
  Administered 2022-07-05 – 2022-07-08 (×4): 1 via ORAL
  Filled 2022-07-05 (×4): qty 1

## 2022-07-05 MED ORDER — ALBUMIN HUMAN 25 % IV SOLN
12.5000 g | Freq: Once | INTRAVENOUS | Status: DC
  Filled 2022-07-05: qty 50

## 2022-07-05 MED ORDER — SODIUM CHLORIDE 0.9% IV SOLUTION: Freq: Once | INTRAVENOUS | Status: AC

## 2022-07-05 MED ORDER — THIAMINE MONONITRATE 100 MG PO TABS
100.0000 mg | ORAL_TABLET | Freq: Every day | ORAL | Status: DC
  Administered 2022-07-05 – 2022-07-08 (×4): 100 mg via ORAL
  Filled 2022-07-05 (×4): qty 1

## 2022-07-05 MED ORDER — ENSURE ENLIVE PO LIQD
237.0000 mL | Freq: Three times a day (TID) | ORAL | Status: DC
  Administered 2022-07-05 – 2022-07-08 (×8): 237 mL via ORAL

## 2022-07-05 NOTE — Progress Notes (Addendum)
   NAME:  Stephen Miranda, MRN:  [id number], DOB:  04/30/1971, LOS: 2 ADMISSION DATE:  07/02/2022, CONSULTATION DATE: 07/03/22  REFERRING MD:  Mesner, CHIEF COMPLAINT:  GI bleed    History of Present Illness:  Stephen Miranda is a 51 yo man with hx of etoh use (sober 4 years), hx of gastric duodenal and esophageal varices here  with hematemesis.  Began at noon.  Dizzy and weak.  EMS reported an estimated 1L blood on floor.   Hx of melena.   Initially tachycardic and hypotensive.   In ED given protonix infusion, octreotide, reglan, fentanyl 50 x 2, ctx.  +1L  3 unit blood given, 4th stopped due to possible local reaction.  Patient became more altered, more lethargic, hypotensive.  Responded to rapid infusion of blood (4 u and ffp).    Multiple large episodes bright red emesis, large volume, also large volume melena.   Pertinent  Medical History  Insomnia Elevated liver enzymes  Cirrhosis, etoh  Hx UGIB  Malignant melanoma hx   Smokes 1/2 ppd  x 20 y No etoh  currently  PMH:  Coreg, lasix, B vitamins, MVI  Significant Hospital Events: Including procedures, antibiotic start and stop dates in addition to other pertinent events   12/13 admit, EGD 12/15 BRTO  Interim History / Subjective:  Awake on vent.  Objective   Blood pressure (!) 111/49, pulse 96, temperature 98.7 F (37.1 C), temperature source Axillary, resp. rate 16, height '6\' 1"'$  (1.854 m), weight 86.1 kg, SpO2 100 %.    Vent Mode: PRVC FiO2 (%):  [40 %] 40 % Set Rate:  [18 bmp] 18 bmp Vt Set:  [640 mL] 640 mL PEEP:  [5 cmH20] 5 cmH20 Plateau Pressure:  [15 cmH20] 15 cmH20   Intake/Output Summary (Last 24 hours) at 07/05/2022 0941 Last data filed at 07/05/2022 0930 Gross per 24 hour  Intake 3423.37 ml  Output 2100 ml  Net 1323.37 ml    Filed Weights   07/03/22 0020 07/04/22 0500 07/05/22 0400  Weight: 90.7 kg 84.8 kg 86.1 kg    Examination: No distress ETT no secretions Moving ext to command Abd soft Old blood in  rectal tube Lungs clear Heart sounds regular, ext warm  BMP ok All cell lines down on CBC  Assessment & Plan:  Recurrent gastric variceal bleeding- for IR today, leaving intubated for airway protection.  S/P BRTO 12/14 afternoon, given heavy sedation so left on vent overnight. Urinary retention- hopefully improves with mobility Lingering shock- suspect sedation related, will see how does through day  - Vent bundle, VAP prevention measures - SAT/SBT, consider extubation later today - Progressive mobility - Octreotide x 5 days - PPI gtt x 3 days then BID - Repeat CTA 48h per IR - Repeat EGD ~1 mo w/ GI - Bladder scan qshift, I/o PRN - Check CBC at 14:00, if looks good off vent and CBC stable and off pressors can go to floor for ongoing care  Best Practice (right click and "Reselect all SmartList Selections" daily)   Diet/type: NPO, TF vs. PO depending on vent status DVT prophylaxis: SCD GI prophylaxis: N/A Lines: N/A Foley:  N/A Code Status:  full code Last date of multidisciplinary goals of care discussion [spoke with mother bedside 07/04/22]  33 min cc time Erskine Emery MD PCCM

## 2022-07-05 NOTE — Procedures (Signed)
Extubation Procedure Note  Patient Details:   Name: Stephen Miranda DOB: Jan 23, 1971 MRN: [id number]   Airway Documentation:    Vent end date: 07/05/22 Vent end time: 0807   Evaluation  O2 sats: stable throughout Complications: No apparent complications Patient did tolerate procedure well. Bilateral Breath Sounds: Diminished, Clear   Yes Pt was successfully extubated with no apparent complications. Audible cuffleak was heard prior extubation and no signs of stridor are present at this time. Pt is able to state name and location and is stable on 2L [place] at this time. RT will monitor as needed.   Felecia Jan 07/05/2022, 8:18 AM

## 2022-07-05 NOTE — Progress Notes (Signed)
eLink Physician-Brief Progress Note Patient Name: Stephen Miranda DOB: 1970/10/25 MRN: [id number]   Date of Service  07/05/2022  HPI/Events of Note  Patient with end stage liver disease s/p TIPS for variceal bleeding, currently on 17 mcg of Levophed via piv, serum albumen is 2.0 gm / dl.  eICU Interventions  Will give 50 gm of 25 % albumin iv and attempt to wean Levophed gtt off as tolerated.        Kerry Kass Jessie Cowher 07/05/2022, 12:31 AM

## 2022-07-05 NOTE — Progress Notes (Signed)
Referring Physician(s): Ina Homes, MD  Supervising Physician: Daryll Brod  Patient Status:  [facility] - In-pt  Chief Complaint: Follow up BRTO 07/04/22 with Dr. Annamaria Boots  Subjective:  Patient extubated this morning, sitting up in bed, IV RN at bedside. He denies complaints, feels decent. No further bleeding noted by patient or staff.   Allergies: Patient has no known allergies.  Medications: Prior to Admission medications   Medication Sig Start Date End Date Taking? Authorizing Provider  carvedilol (COREG) 3.125 MG tablet Take 1 tablet (3.125 mg total) by mouth 2 (two) times daily with a meal. Patient not taking: Reported on 07/02/2022 12/28/21   Mack Hook, MD  furosemide (LASIX) 20 MG tablet Take 1 tablet (20 mg total) by mouth daily. Patient not taking: Reported on 06/10/2022 03/11/22   Rondel Oh, MD  gabapentin (NEURONTIN) 100 MG capsule 1 cap by mouth at bedtime Patient not taking: Reported on 03/18/2022 12/28/21   Mack Hook, MD  Multiple Vitamin (MULTIVITAMIN WITH MINERALS) TABS tablet Take 1 tablet by mouth daily. Patient not taking: Reported on 07/02/2022 05/23/19   Debbe Odea, MD     Vital Signs: BP (!) 110/51   Pulse (!) 202   Temp 98.7 F (37.1 C) (Axillary)   Resp 18   Ht '6\' 1"'$  (1.854 m)   Wt 189 lb 13.1 oz (86.1 kg)   SpO2 99%   BMI 25.04 kg/m   Physical Exam Vitals and nursing note reviewed.  Constitutional:      General: He is not in acute distress. HENT:     Head: Normocephalic.  Cardiovascular:     Rate and Rhythm: Regular rhythm. Tachycardia present.     Comments: (+) Right CFV puncture site clean, dry, dressed appropriately. Soft, non tender. No evidence of bleeding. Pulmonary:     Effort: Pulmonary effort is normal.     Breath sounds: Normal breath sounds.  Abdominal:     Palpations: Abdomen is soft.  Skin:    General: Skin is warm and dry.  Neurological:     Mental Status: He is alert. Mental status is at baseline.      Imaging: CT Angio Abd/Pel w/ and/or w/o  Result Date: 07/05/2022 CLINICAL DATA:  51 year old male with history of portal hypertension hematemesis status post transvenous obliteration of gastroesophageal varices on 07/04/2022. EXAM: CTA ABDOMEN AND PELVIS WITHOUT AND WITH CONTRAST TECHNIQUE: Multidetector CT imaging of the abdomen and pelvis was performed using the standard protocol during bolus administration of intravenous contrast. Multiplanar reconstructed images and MIPs were obtained and reviewed to evaluate the vascular anatomy. RADIATION DOSE REDUCTION: This exam was performed according to the departmental dose-optimization program which includes automated exposure control, adjustment of the mA and/or kV according to patient size and/or use of iterative reconstruction technique. CONTRAST:  23m OMNIPAQUE IOHEXOL 350 MG/ML SOLN COMPARISON:  07/04/2022, 07/03/2022 FINDINGS: VASCULAR Aorta: Normal caliber aorta without aneurysm, dissection, vasculitis or significant stenosis. Mild scattered infrarenal atherosclerotic calcification. Celiac: Patent without evidence of aneurysm, dissection, vasculitis or significant stenosis. SMA: Patent without evidence of aneurysm, dissection, vasculitis or significant stenosis. Renals: Both renal arteries are patent without evidence of aneurysm, dissection, vasculitis, fibromuscular dysplasia or significant stenosis. IMA: Patent without evidence of aneurysm, dissection, vasculitis or significant stenosis. Inflow: Patent without evidence of aneurysm, dissection, vasculitis or significant stenosis. Proximal Outflow: Bilateral common femoral and visualized portions of the superficial and profunda femoral arteries are patent without evidence of aneurysm, dissection, vasculitis or significant stenosis. Veins: The hepatic veins are poorly  opacified, however appear patent. The intrahepatic portal veins, main vein, superior mesenteric vein, and inferior mesenteric vein are  widely patent and normal in caliber. Limited visualization of the splenic vein due to streak artifact. Status post coil assisted transvenous obliteration via left gastro renal shunt with radiopaque left fide all material within esophageal and gastric varices. Large coil pack about the inferior aspect of the gastro renal shunt. The left gastric vein is patent. Previously visualized posterior gastric vein is difficult to visualize due to streak artifact. The renal veins are patent bilaterally with retroaortic left renal vein. There is similar appearing compression of left renal vein by the overlying aorta. No evidence of iliocaval thrombosis or anomaly. Similar appearing moderate compression of left common iliac vein the overlying right common iliac artery. Review of the MIP images confirms the above findings. NON-VASCULAR Lower chest: Bibasilar subsegmental atelectasis. Hepatobiliary: Mildly shrunken right lobe of the liver with left lobe hypertrophy. Diffuse macronodular contour. Scattered well-defined water density hypoattenuating rounded lesions throughout the hepatic parenchyma, unchanged. The gallbladder is present and unremarkable. No intra or extrahepatic biliary ductal dilation. Pancreas: Unremarkable. No pancreatic ductal dilatation or surrounding inflammatory changes. Spleen: Normal in size without focal abnormality. Adrenals/Urinary Tract: Adrenal glands are unremarkable. Unchanged scattered simple renal cysts. Kidneys are otherwise normal, without renal calculi, focal lesion, or hydronephrosis. Bladder is unremarkable. Stomach/Bowel: Interval transvenous obliteration of previously visualized large gastric varices with intraluminal lipiodol visualized in the medial gastric cardia. The remaining stomach is within normal limits. No evidence of significant bowel distension or wall thickening. Lymphatic: No abdominopelvic lymphadenopathy. Reproductive: Similar mild prostatomegaly. Other: Similar trace ascites.  Musculoskeletal: No acute or significant osseous findings. IMPRESSION: 1. Postprocedural changes status post coil assisted transvenous obliteration of gastroesophageal varices, no complicating features. 2. Similar appearing cirrhotic stigmata and trace ascites. Ruthann Cancer, MD [facility] Radiology Electronically Signed   By: Ruthann Cancer M.D.   On: 07/05/2022 12:37   IR Venogram Renal Uni Left  Result Date: 07/04/2022 CLINICAL DATA:  LARGE BLEEDING GASTRIC VARICES RESULTING IN ACUTE BLOOD LOSS ANEMIA EXAM: ULTRASOUND GUIDANCE FOR VASCULAR ACCESS LEFT RENAL CATHETERIZATION AND VENOGRAM LEFT GASTRO RENAL SHUNT CATHETERIZATION, VENOGRAM BALLOON OCCLUDED RETROGRADE TRANSVENOUS OBLITERATION (BRTO OF THE GASTRIC VARICES MEDICATIONS: As antibiotic prophylaxis, 2 G ANCEF was ordered pre-procedure and administered intravenously within one hour of incision. ANESTHESIA/SEDATION: General - as administered by the Anesthesia department CONTRAST:  65 cc Isovue 300 FLUOROSCOPY: Radiation Exposure Index (as provided by the fluoroscopic device): 449 mGy Kerma COMPLICATIONS: None immediate. PROCEDURE: Informed written consent was obtained from the patient's mother Gwenette Greet Rueda) after a thorough discussion of the procedural risks, benefits and alternatives. All questions were addressed. Maximal Sterile Barrier Technique was utilized including caps, mask, sterile gowns, sterile gloves, sterile drape, hand hygiene and skin antiseptic. A timeout was performed prior to the initiation of the procedure. Previous imaging reviewed. Under sterile conditions and general anesthesia, right common femoral vein micropuncture access performed with ultrasound. Images obtained for documentation of the patent right common femoral vein. Guidewire inserted easily followed by the micro dilator set. Tract dilatation performed to insert the 35 cm 10 French braided TIPS sheath into the IVC. Five [place]  Kumpe catheter advanced into the left renal vein. Initial left renal venogram performed. Left renal vein is widely patent. Large gastro renal shunt noted superiorly correlating with the CT findings. Kumpe catheter and Glidewire utilized to access the gastro renal shunt. Five French catheter advanced into the gastro renal shunt over  a Glidewire. Initial shunt venogram performed. Gastro renal shunt is incompletely opacified but widely patent. Amplatz guidewire inserted. Ten French sheath advanced into the orifice of the gastro renal shunt. 11.5 mm balloon occlusion catheter advanced over the Amplatz guidewire into the gastro renal shunt past the level of the outflow valve. 11.5 mm balloon was inflated for balloon occlusion. Retrograde CO2 balloon occluded venogram performed. CO2 venogram: This confirms the large gastric varices with retrograde communication visualized via the posterior gastric to the splenic vein and the left gastric vein to the portal mesenteric confluence. Mesenteric and portal veins are also patent and normal in caliber. The double marker lantern microcatheter was advanced through the balloon occlusion catheter into the gastric varix. Contrast injection confirms position. Balloon occluded retrograde transvenous obliteration performed of the gastric varices by slowly instilling approximally 40 cc total volume of foam sclerotherapy (mixture of 6 cc air, 4 cc 3% ST S, and 2 cc Lipiodol). Reflux was visualized within the posterior gastric vein communicating with the splenic vein. At this point, no further sclerotherapy injected. Lantern catheter was removed and flushed. This was replaced within the gastro renal shunt above the balloon occlusion catheter. Coil embolization performed by deploying a total of 14 Ruby coils ranging in size from 28-40 mm and 60 cm length. Several additional packing coils were also deployed for the coil pack. Following this, the balloon was left inflated for 10 minutes. Under  fluoroscopy, the balloon was slowly deflated without any coil pack migration. Repeat venogram confirms occlusion of the gastro renal shunt and the gastric varices. Orifice of the gastro renal shunt into the renal vein remains patent. Balloon occlusion catheter removed. Final venogram performed confirming preserved patency of the left renal vein. Access removed. Hemostasis obtained with manual compression. Patient tolerated the procedure well. IMPRESSION: Successful balloon occluded retrograde transvenous obliteration (BRTO) of the gastric varices as detailed above. Follow-up CTA (BRTO protocol) will be performed 48 hours to assess for complete GV occlusion. Electronically Signed   By: Jerilynn Mages.  Shick M.D.   On: 07/04/2022 17:34   IR US Guide Vasc Access Right  Result Date: 07/04/2022 CLINICAL DATA:  LARGE BLEEDING GASTRIC VARICES RESULTING IN ACUTE BLOOD LOSS ANEMIA EXAM: ULTRASOUND GUIDANCE FOR VASCULAR ACCESS LEFT RENAL CATHETERIZATION AND VENOGRAM LEFT GASTRO RENAL SHUNT CATHETERIZATION, VENOGRAM BALLOON OCCLUDED RETROGRADE TRANSVENOUS OBLITERATION (BRTO OF THE GASTRIC VARICES MEDICATIONS: As antibiotic prophylaxis, 2 G ANCEF was ordered pre-procedure and administered intravenously within one hour of incision. ANESTHESIA/SEDATION: General - as administered by the Anesthesia department CONTRAST:  65 cc Isovue 300 FLUOROSCOPY: Radiation Exposure Index (as provided by the fluoroscopic device): 202 mGy Kerma COMPLICATIONS: None immediate. PROCEDURE: Informed written consent was obtained from the patient's mother Gwenette Greet [place]) after a thorough discussion of the procedural risks, benefits and alternatives. All questions were addressed. Maximal Sterile Barrier Technique was utilized including caps, mask, sterile gowns, sterile gloves, sterile drape, hand hygiene and skin antiseptic. A timeout was performed prior to the initiation of the procedure. Previous imaging reviewed. Under sterile conditions and general  anesthesia, right common femoral vein micropuncture access performed with ultrasound. Images obtained for documentation of the patent right common femoral vein. Guidewire inserted easily followed by the micro dilator set. Tract dilatation performed to insert the 35 cm 10 French braided TIPS sheath into the IVC. Five [place] Kumpe catheter advanced into the left renal vein. Initial left renal venogram performed. Left renal vein is widely patent. Large gastro renal shunt noted superiorly correlating with the CT findings. Kumpe catheter  and Glidewire utilized to access the gastro renal shunt. Five French catheter advanced into the gastro renal shunt over a Glidewire. Initial shunt venogram performed. Gastro renal shunt is incompletely opacified but widely patent. Amplatz guidewire inserted. Ten French sheath advanced into the orifice of the gastro renal shunt. 11.5 mm balloon occlusion catheter advanced over the Amplatz guidewire into the gastro renal shunt past the level of the outflow valve. 11.5 mm balloon was inflated for balloon occlusion. Retrograde CO2 balloon occluded venogram performed. CO2 venogram: This confirms the large gastric varices with retrograde communication visualized via the posterior gastric to the splenic vein and the left gastric vein to the portal mesenteric confluence. Mesenteric and portal veins are also patent and normal in caliber. The double marker lantern microcatheter was advanced through the balloon occlusion catheter into the gastric varix. Contrast injection confirms position. Balloon occluded retrograde transvenous obliteration performed of the gastric varices by slowly instilling approximally 40 cc total volume of foam sclerotherapy (mixture of 6 cc air, 4 cc 3% ST S, and 2 cc Lipiodol). Reflux was visualized within the posterior gastric vein communicating with the splenic vein. At this point, no further sclerotherapy injected. Lantern catheter was removed and flushed. This was  replaced within the gastro renal shunt above the balloon occlusion catheter. Coil embolization performed by deploying a total of 14 Ruby coils ranging in size from 28-40 mm and 60 cm length. Several additional packing coils were also deployed for the coil pack. Following this, the balloon was left inflated for 10 minutes. Under fluoroscopy, the balloon was slowly deflated without any coil pack migration. Repeat venogram confirms occlusion of the gastro renal shunt and the gastric varices. Orifice of the gastro renal shunt into the renal vein remains patent. Balloon occlusion catheter removed. Final venogram performed confirming preserved patency of the left renal vein. Access removed. Hemostasis obtained with manual compression. Patient tolerated the procedure well. IMPRESSION: Successful balloon occluded retrograde transvenous obliteration (BRTO) of the gastric varices as detailed above. Follow-up CTA (BRTO protocol) will be performed 48 hours to assess for complete GV occlusion. Electronically Signed   By: Jerilynn Mages.  Shick M.D.   On: 07/04/2022 17:34   IR Angiogram Selective Each Additional Vessel  Result Date: 07/04/2022 CLINICAL DATA:  LARGE BLEEDING GASTRIC VARICES RESULTING IN ACUTE BLOOD LOSS ANEMIA EXAM: ULTRASOUND GUIDANCE FOR VASCULAR ACCESS LEFT RENAL CATHETERIZATION AND VENOGRAM LEFT GASTRO RENAL SHUNT CATHETERIZATION, VENOGRAM BALLOON OCCLUDED RETROGRADE TRANSVENOUS OBLITERATION (BRTO OF THE GASTRIC VARICES MEDICATIONS: As antibiotic prophylaxis, 2 G ANCEF was ordered pre-procedure and administered intravenously within one hour of incision. ANESTHESIA/SEDATION: General - as administered by the Anesthesia department CONTRAST:  65 cc Isovue 300 FLUOROSCOPY: Radiation Exposure Index (as provided by the fluoroscopic device): 527 mGy Kerma COMPLICATIONS: None immediate. PROCEDURE: Informed written consent was obtained from the patient's mother Gwenette Greet [place]) after a thorough discussion of the procedural risks,  benefits and alternatives. All questions were addressed. Maximal Sterile Barrier Technique was utilized including caps, mask, sterile gowns, sterile gloves, sterile drape, hand hygiene and skin antiseptic. A timeout was performed prior to the initiation of the procedure. Previous imaging reviewed. Under sterile conditions and general anesthesia, right common femoral vein micropuncture access performed with ultrasound. Images obtained for documentation of the patent right common femoral vein. Guidewire inserted easily followed by the micro dilator set. Tract dilatation performed to insert the 35 cm 10 French braided TIPS sheath into the IVC. Five [place] Kumpe catheter advanced into the left renal vein. Initial left renal venogram performed.  Left renal vein is widely patent. Large gastro renal shunt noted superiorly correlating with the CT findings. Kumpe catheter and Glidewire utilized to access the gastro renal shunt. Five French catheter advanced into the gastro renal shunt over a Glidewire. Initial shunt venogram performed. Gastro renal shunt is incompletely opacified but widely patent. Amplatz guidewire inserted. Ten French sheath advanced into the orifice of the gastro renal shunt. 11.5 mm balloon occlusion catheter advanced over the Amplatz guidewire into the gastro renal shunt past the level of the outflow valve. 11.5 mm balloon was inflated for balloon occlusion. Retrograde CO2 balloon occluded venogram performed. CO2 venogram: This confirms the large gastric varices with retrograde communication visualized via the posterior gastric to the splenic vein and the left gastric vein to the portal mesenteric confluence. Mesenteric and portal veins are also patent and normal in caliber. The double marker lantern microcatheter was advanced through the balloon occlusion catheter into the gastric varix. Contrast injection confirms position. Balloon occluded retrograde transvenous obliteration performed of the gastric  varices by slowly instilling approximally 40 cc total volume of foam sclerotherapy (mixture of 6 cc air, 4 cc 3% ST S, and 2 cc Lipiodol). Reflux was visualized within the posterior gastric vein communicating with the splenic vein. At this point, no further sclerotherapy injected. Lantern catheter was removed and flushed. This was replaced within the gastro renal shunt above the balloon occlusion catheter. Coil embolization performed by deploying a total of 14 Ruby coils ranging in size from 28-40 mm and 60 cm length. Several additional packing coils were also deployed for the coil pack. Following this, the balloon was left inflated for 10 minutes. Under fluoroscopy, the balloon was slowly deflated without any coil pack migration. Repeat venogram confirms occlusion of the gastro renal shunt and the gastric varices. Orifice of the gastro renal shunt into the renal vein remains patent. Balloon occlusion catheter removed. Final venogram performed confirming preserved patency of the left renal vein. Access removed. Hemostasis obtained with manual compression. Patient tolerated the procedure well. IMPRESSION: Successful balloon occluded retrograde transvenous obliteration (BRTO) of the gastric varices as detailed above. Follow-up CTA (BRTO protocol) will be performed 48 hours to assess for complete GV occlusion. Electronically Signed   By: Jerilynn Mages.  Shick M.D.   On: 07/04/2022 17:34   IR EMBO ART  VEN HEMORR LYMPH EXTRAV  INC GUIDE ROADMAPPING  Result Date: 07/04/2022 CLINICAL DATA:  LARGE BLEEDING GASTRIC VARICES RESULTING IN ACUTE BLOOD LOSS ANEMIA EXAM: ULTRASOUND GUIDANCE FOR VASCULAR ACCESS LEFT RENAL CATHETERIZATION AND VENOGRAM LEFT GASTRO RENAL SHUNT CATHETERIZATION, VENOGRAM BALLOON OCCLUDED RETROGRADE TRANSVENOUS OBLITERATION (BRTO OF THE GASTRIC VARICES MEDICATIONS: As antibiotic prophylaxis, 2 G ANCEF was ordered pre-procedure and administered intravenously within one hour of incision. ANESTHESIA/SEDATION:  General - as administered by the Anesthesia department CONTRAST:  65 cc Isovue 300 FLUOROSCOPY: Radiation Exposure Index (as provided by the fluoroscopic device): 537 mGy Kerma COMPLICATIONS: None immediate. PROCEDURE: Informed written consent was obtained from the patient's mother Gwenette Greet [place]) after a thorough discussion of the procedural risks, benefits and alternatives. All questions were addressed. Maximal Sterile Barrier Technique was utilized including caps, mask, sterile gowns, sterile gloves, sterile drape, hand hygiene and skin antiseptic. A timeout was performed prior to the initiation of the procedure. Previous imaging reviewed. Under sterile conditions and general anesthesia, right common femoral vein micropuncture access performed with ultrasound. Images obtained for documentation of the patent right common femoral vein. Guidewire inserted easily followed by the micro dilator set. Tract dilatation performed to insert the  35 cm 10 [place] braided TIPS sheath into the IVC. Five [place] Kumpe catheter advanced into the left renal vein. Initial left renal venogram performed. Left renal vein is widely patent. Large gastro renal shunt noted superiorly correlating with the CT findings. Kumpe catheter and Glidewire utilized to access the gastro renal shunt. Five French catheter advanced into the gastro renal shunt over a Glidewire. Initial shunt venogram performed. Gastro renal shunt is incompletely opacified but widely patent. Amplatz guidewire inserted. Ten French sheath advanced into the orifice of the gastro renal shunt. 11.5 mm balloon occlusion catheter advanced over the Amplatz guidewire into the gastro renal shunt past the level of the outflow valve. 11.5 mm balloon was inflated for balloon occlusion. Retrograde CO2 balloon occluded venogram performed. CO2 venogram: This confirms the large gastric varices with retrograde communication visualized via the posterior gastric to the splenic vein and the left  gastric vein to the portal mesenteric confluence. Mesenteric and portal veins are also patent and normal in caliber. The double marker lantern microcatheter was advanced through the balloon occlusion catheter into the gastric varix. Contrast injection confirms position. Balloon occluded retrograde transvenous obliteration performed of the gastric varices by slowly instilling approximally 40 cc total volume of foam sclerotherapy (mixture of 6 cc air, 4 cc 3% ST S, and 2 cc Lipiodol). Reflux was visualized within the posterior gastric vein communicating with the splenic vein. At this point, no further sclerotherapy injected. Lantern catheter was removed and flushed. This was replaced within the gastro renal shunt above the balloon occlusion catheter. Coil embolization performed by deploying a total of 14 Ruby coils ranging in size from 28-40 mm and 60 cm length. Several additional packing coils were also deployed for the coil pack. Following this, the balloon was left inflated for 10 minutes. Under fluoroscopy, the balloon was slowly deflated without any coil pack migration. Repeat venogram confirms occlusion of the gastro renal shunt and the gastric varices. Orifice of the gastro renal shunt into the renal vein remains patent. Balloon occlusion catheter removed. Final venogram performed confirming preserved patency of the left renal vein. Access removed. Hemostasis obtained with manual compression. Patient tolerated the procedure well. IMPRESSION: Successful balloon occluded retrograde transvenous obliteration (BRTO) of the gastric varices as detailed above. Follow-up CTA (BRTO protocol) will be performed 48 hours to assess for complete GV occlusion. Electronically Signed   By: Jerilynn Mages.  Shick M.D.   On: 07/04/2022 17:34   CT Angio Abd/Pel w/ and/or w/o  Result Date: 07/03/2022 CLINICAL DATA:  51 year old male with history of portal hypertension and hematemesis. EXAM: CTA ABDOMEN AND PELVIS WITHOUT AND WITH CONTRAST  TECHNIQUE: Multidetector CT imaging of the abdomen and pelvis was performed using the standard protocol during bolus administration of intravenous contrast. Multiplanar reconstructed images and MIPs were obtained and reviewed to evaluate the vascular anatomy. RADIATION DOSE REDUCTION: This exam was performed according to the departmental dose-optimization program which includes automated exposure control, adjustment of the mA and/or kV according to patient size and/or use of iterative reconstruction technique. CONTRAST:  20m OMNIPAQUE IOHEXOL 350 MG/ML SOLN COMPARISON:  05/20/2019 FINDINGS: VASCULAR Aorta: Patent normal caliber throughout very mild infrarenal atherosclerotic calcifications. Celiac: Patent without evidence of aneurysm, dissection, vasculitis or significant stenosis. SMA: Patent without evidence of aneurysm, dissection, vasculitis or significant stenosis. Renals: Early branching single right and dual left renal arteries are patent without evidence of aneurysm, dissection, vasculitis, fibromuscular dysplasia or significant stenosis. IMA: Patent without evidence of aneurysm, dissection, vasculitis or significant stenosis. Inflow: Patent without  evidence of aneurysm, dissection, vasculitis or significant stenosis. Proximal Outflow: Bilateral common femoral and visualized portions of the superficial and profunda femoral arteries are patent without evidence of aneurysm, dissection, vasculitis or significant stenosis. Veins: The hepatic veins are poorly opacified, however appear patent. The intrahepatic portal veins, main vein, superior mesenteric vein, splenic vein, and inferior mesenteric vein is widely patent and normal in caliber. There is a large gastro renal shunt measuring up to 25 mm diameter at its junction with the left vein. Multiple large gastric varices are visualized. There are multiple smaller distal esophageal varices present. Mildly prominent left and posterior gastric veins. The renal  veins are patent bilaterally with retroaortic left renal vein. There is compression of left renal vein by the overlying aorta. No evidence of iliocaval thrombosis or anomaly. Moderate compression of left common iliac vein the overlying right common iliac artery. Review of the MIP images confirms the above findings. NON-VASCULAR Lower chest: No acute abnormality. Hepatobiliary: Mildly shrunken right lobe of the liver with left lobe hypertrophy. Diffuse macronodular contour. Scattered well-defined water density hypoattenuating rounded lesions throughout the hepatic parenchyma, the largest in segment 2 measuring up to 1.4 cm. The gallbladder is present and unremarkable. No intra or extrahepatic biliary ductal dilation. Pancreas: Unremarkable. No pancreatic ductal dilatation or surrounding inflammatory changes. Spleen: Measures up to 11 cm in maximum craniocaudal dimension. No focal mass. Adrenals/Urinary Tract: Adrenal glands are unremarkable. Similar appearing scattered simple cysts, largest in left interpolar region measuring 0.9 cm. Kidneys are otherwise normal, without renal calculi, focal lesion, or hydronephrosis. Bladder is unremarkable. Stomach/Bowel: Multiple large varices within the gastric cardia. The stomach is otherwise within limits. Appendix appears normal. Mild diffuse colonic mural thickening without surrounding inflammatory changes. No evidence of bowel wall thickening, distention, or inflammatory changes. Lymphatic: No abdominopelvic lymphadenopathy. Reproductive: Prostate gland measures up to 5.3 cm in maximum axial dimension. Other: Trace ascites, most prominent the bilateral upper quadrants. Tiny fat containing umbilical hernia. Musculoskeletal: No acute or significant osseous findings. IMPRESSION: VASCULAR 1. Portal hypertension as evidenced by large gastrorenal shunt and multiple prominent gastroesophageal varices in addition to trace ascites and colonic portal enteropathy. 2.  Aortic  Atherosclerosis (ICD10-I70.0). NON-VASCULAR 1. Morphologic changes of hepatic cirrhosis.  No hepatoma. 2. Prostatomegaly. Ruthann Cancer, MD [facility] Electronically Signed   By: Ruthann Cancer M.D.   On: 07/03/2022 10:46   DG CHEST PORT 1 VIEW  Result Date: 07/03/2022 CLINICAL DATA:  [id number] with ventilator dependent respiratory failure. EXAM: PORTABLE CHEST 1 VIEW COMPARISON:  Portable chest yesterday at 11:07 p.m. FINDINGS: 4:13 a.m. ETT interval insertion with tip 4.5 cm from the carina, mid tracheal. The cardiac size is normal. The mediastinum is normally outlined. No vascular congestion is seen. The lungs are clear. The sulci are sharp. The thoracic cage is intact. There are multiple overlying monitor wires. IMPRESSION: 1. No evidence of acute chest disease. 2. ETT interval insertion with tip 4.5 cm from the carina. Electronically Signed   By: Telford Nab M.D.   On: 07/03/2022 04:28   DG Abd Acute W/Chest  Result Date: 07/02/2022 CLINICAL DATA:  Hematemesis EXAM: DG ABDOMEN ACUTE WITH 1 VIEW CHEST COMPARISON:  05/20/2019 FINDINGS: Cardiac shadow is within normal limits. The lungs are clear bilaterally. Scattered large and small bowel gas is noted. No obstructive changes are seen. No free air is noted. No bony abnormality is seen. IMPRESSION: No acute abnormality noted. Electronically Signed   By: Linus Mako.D.  On: 07/02/2022 23:25    Labs:  CBC: Recent Labs    07/04/22 0420 07/04/22 1325 07/04/22 2306 07/05/22 0423  WBC 19.3* 18.7* 21.7* 13.4*  HGB 9.9* 9.5* 8.8* 7.5*  HCT 27.5* 26.5* 25.0* 22.8*  PLT 108* 110* 98* 66*    COAGS: Recent Labs    07/02/22 2300 07/03/22 0331 07/04/22 0420 07/05/22 0423  INR 1.9* 2.3* 1.6* 1.8*    BMP: Recent Labs    06/10/22 1230 07/02/22 2300 07/02/22 2310 07/04/22 0420 07/05/22 0423  NA 136 140 142 140 138  K 3.4* 4.1 4.1 3.8 3.6  CL 103 112* 110 114* 109  CO2 23  15*  --  21* 22  GLUCOSE 89 175* 166* 129* 118*  BUN <5* 23* 25* 22* 17  CALCIUM 8.9 7.8*  --  7.2* 7.7*  CREATININE 0.70 0.78 0.60* 0.79 0.83  GFRNONAA >60 >60  --  >60 >60    LIVER FUNCTION TESTS: Recent Labs    06/10/22 1230 07/02/22 2300 07/04/22 0420 07/05/22 0423  BILITOT 5.6* 2.4* 3.2* 3.1*  AST 59* 50* 50* 41  ALT '21 17 19 18  '$ ALKPHOS 77 57 37* 34*  PROT 8.2* 5.1* 4.5* 4.8*  ALBUMIN 3.4* 2.1* 2.0* 2.6*    Assessment and Plan:  51 y/o M with history of ETOH abuse, UGI bleeding, esophageal varices, duodenal varices, portal gastropathy who presented [facility] ED 07/02/22 with acute UGI bleeding and hemodynamic compromise. He underwent BRTO of gastrorenal shunt yesterday with Dr. Annamaria Boots.  Patient extubated successfully this morning, no further bleeding, right CFV puncture site unremarkable. CTA BRTO protocol this morning showed successful coil embolization of the left gastro renal shunt and intraluminal lipiodol obliteration of large gastric varices.   Plan: - May remove right CFV dressing and shower, do not submerge x 7 days - Recommend follow up EGD in 4-6 weeks with GI - Repeat CTA in 4 months with follow up in IR (Dr. Annamaria Boots) after completed. This has been ordered and IR scheduler will call patient to setup appointments. IR contact information in AVS should he have any questions before that time.  Further plans per primary team. IR remains available as needed.  Please call with questions or concerns.  Electronically Signed: Joaquim Nam, PA-C 07/05/2022, 2:26 PM   I spent a total of 15 Minutes at the the patient's bedside AND on the patient's hospital floor or unit, greater than 50% of which was counseling/coordinating care for BRTO follow up.

## 2022-07-05 NOTE — Progress Notes (Signed)
   07/05/22 2213  Assess: MEWS Score  Temp 97.8 F (36.6 C)  BP (!) 130/58  MAP (mmHg) 78  Pulse Rate (!) 112  ECG Heart Rate (!) 113  Resp 16  Level of Consciousness Alert  SpO2 97 %  O2 Device Room Air  Assess: MEWS Score  MEWS Temp 0  MEWS Systolic 0  MEWS Pulse 2  MEWS RR 0  MEWS LOC 0  MEWS Score 2  MEWS Score Color Yellow  Assess: if the MEWS score is Yellow or Red  Were vital signs taken at a resting state? Yes  Focused Assessment No change from prior assessment  Does the patient meet 2 or more of the SIRS criteria? No  MEWS guidelines implemented *See Row Information* No, previously yellow, continue vital signs every 4 hours  Treat  Pain Scale 0-10  Pain Score 0  Take Vital Signs  Increase Vital Sign Frequency  Yellow: Q 2hr X 2 then Q 4hr X 2, if remains yellow, continue Q 4hrs  Assess: SIRS CRITERIA  SIRS Temperature  0  SIRS Pulse 1  SIRS Respirations  0  SIRS WBC 0  SIRS Score Sum  1

## 2022-07-05 NOTE — TOC Initial Note (Signed)
Transition of Care [facility]) - Initial/Assessment Note    Patient Details  Name: Stephen Miranda MRN: [id number] Date of Birth: [birth year]  Transition of Care [facility]) CM/SW Contact:    Erenest Rasher, RN Phone Number: [phone] 07/05/2022, 4:32 PM  Clinical Narrative:                  TOC CM spoke to pt at bedside. States his disability is pending. Will send referral to Financial Counselor to screen Medicaid and CAFA.   Expected Discharge Plan: Home/Self Care Barriers to Discharge: Continued Medical Work up   Patient Goals and CMS Choice Patient states their goals for this hospitalization and ongoing recovery are:: wants to remain independent      Expected Discharge Plan and Services Expected Discharge Plan: Home/Self Care   Discharge Planning Services: CM Consult   Living arrangements for the past 2 months: Apartment                                      Prior Living Arrangements/Services Living arrangements for the past 2 months: Apartment Lives with:: Self Patient language and need for interpreter reviewed:: Yes Do you feel safe going back to the place where you live?: Yes      Need for Family Participation in Patient Care: No (Comment) Care giver support system in place?: Yes (comment)   Criminal Activity/Legal Involvement Pertinent to Current Situation/Hospitalization: No - Comment as needed  Activities of Daily Living      Permission Sought/Granted Permission sought to share information with : Case Manager, Family Supports, PCP Permission granted to share information with : Yes, Verbal Permission Granted  Share Information with NAME: Tionne Dayhoff     Permission granted to share info w Relationship: mother  Permission granted to share info w Contact Information: [phone]  Emotional Assessment Appearance:: Appears stated age Attitude/Demeanor/Rapport: Engaged Affect (typically observed): Accepting Orientation: : Oriented to Self, Oriented to  Place, Oriented to  Time, Oriented to Situation   Psych Involvement: No (comment)  Admission diagnosis:  Hematemesis [K92.0] Hematemesis with nausea [K92.0] Patient Active Problem List   Diagnosis Date Noted   Shock ([place]) 07/04/2022   Pressure injury of skin 07/04/2022   Hematemesis 07/03/2022   Portal hypertension ([place]) 07/03/2022   Bleeding gastric varices 07/03/2022   Esophageal and gastric varices ([place]) 07/03/2022   Skin lesion of cheek 12/45/8099   Umbilical hernia without obstruction and without gangrene 02/15/2020   Edema 83/38/2505   Alcoholic cirrhosis ([place]) 39/76/7341   Folic acid deficiency 93/79/0240   Upper GI bleed 05/20/2019   Abnormal liver function test    Acute post-hemorrhagic anemia    Alcohol abuse    Acute upper GI bleeding 05/19/2019   Macrocytic anemia 05/19/2019   Thrombocytopenia ([place]) 05/19/2019   Coagulopathy ([place]) 05/19/2019   History of alcohol abuse 05/19/2019   Tobacco use 05/23/2016   Seborrheic dermatitis 05/23/2016   Restless legs 05/23/2016   Chronic insomnia 05/23/2016   Malignant melanoma of back ([place]) 01/02/2016   Elevated liver enzymes 04/24/2015   PCP:  Mack Hook, MD Pharmacy:   [place], [place] - [address] [place] [address] Phone: [phone] Fax: [phone]  [facility] [id number] - [place], [place] [address] [place] [address] [place] [address] Phone: [phone] Fax: [phone]  [place], [place] -  [place] [address] Phone: [phone] Fax: [place] [address], [place] [address] [place] [address] Phone: [phone] Fax: [phone]     Social Determinants of Health (SDOH) Interventions    Readmission Risk Interventions     No data to display

## 2022-07-05 NOTE — Telephone Encounter (Signed)
The pt has been scheduled to see Stephen Miranda on 07/24/22 at 130 pm.  He will be given appt information at discharge.

## 2022-07-05 NOTE — Progress Notes (Signed)
Nutrition Follow-up  DOCUMENTATION CODES:   Non-severe (moderate) malnutrition in context of chronic illness  INTERVENTION:   Recommend advancing diet as tolerated to GI soft.   Ensure Enlive po TID, each supplement provides 350 kcal and 20 grams of protein. Ok to provide [employer] supplement until diet advanced past CL  MVI with Minerals daily, Thiamine 100 mg daily x 7 days (Noted pt takes B complex and MVI at home)  No phosphorus this admission; recommend checking   NUTRITION DIAGNOSIS:   Moderate Malnutrition related to chronic illness as evidenced by moderate muscle depletion, moderate fat depletion.  GOAL:   Patient will meet greater than or equal to 90% of their needs  MONITOR:   Vent status, Labs, Skin  REASON FOR ASSESSMENT:   Ventilator    ASSESSMENT:   42 you male admitted with recurrent acute upper GI bleed. PMH includes EtOH abuse, portal HTN, hx of GI bleed with esophageal, IGV and duodenal varices, cirrhosis  12/13 EGD: esophageal varices, possible Barrett's , gastroesophageal varices. CTA with portal HTN, trace ascites and large gastrorenal shunt  12/14 IR: successful BRTO for bleeding GVs 12/15 Extubated  NPO, GI ok with clears today per chart review, discussed with Dr. Tamala Julian and ok to place order for CL today and Ok to advance tomorrow if no issues  Current wt 86.21 kg, wt yesterday 84.8 kg. Net positive with edema still present on exam  Noted Hgb trending down but >7  Pt with hx of EtOH abuse but sober for 4 years per chart review. Noted pt takes B complex in addition to standard MVI at home  Labs: reviewed Meds: octreotide, miralax, colace   Diet Order:   Diet Order             Diet clear liquid Room service appropriate? Yes; Fluid consistency: Thin  Diet effective now                   EDUCATION NEEDS:   Not appropriate for education at this time  Skin:  Skin Assessment: Skin Integrity Issues: Skin Integrity Issues::  Stage I Stage I: sacrum  Last BM:  12/13  Height:   Ht Readings from Last 1 Encounters:  07/03/22 '6\' 1"'$  (1.854 m)    Weight:   Wt Readings from Last 1 Encounters:  07/05/22 86.1 kg     BMI:  Body mass index is 25.04 kg/m.  Estimated Nutritional Needs:   Kcal:  2300-2500 kcals  Protein:  125-150 g  Fluid:  >/= 2 L   Kerman Passey MS, RDN, LDN, CNSC Registered Dietitian 3 Clinical Nutrition RD Pager and On-Call Pager Number Located in [place]

## 2022-07-05 NOTE — Progress Notes (Signed)
Progress Note   Subjective  Patient stable overnight. No further bleeding. Had BRTO per IR yesterday. Remains on pressor and given albumin overnight for hypotension. Hgb has drifted but no overt bleeding.    Objective   Vital signs in last 24 hours: Temp:  [97.4 F (36.3 C)-99.7 F (37.6 C)] 98.7 F (37.1 C) (12/15 0330) Pulse Rate:  [84-103] 96 (12/15 0700) Resp:  [15-22] 21 (12/15 0700) BP: (101-151)/(35-78) 151/60 (12/15 0700) SpO2:  [92 %-100 %] 92 % (12/15 0700) Arterial Line BP: (94-170)/(35-67) 135/55 (12/15 0700) FiO2 (%):  [40 %] 40 % (12/15 0408) Weight:  [86.1 kg] 86.1 kg (12/15 0400) Last BM Date : 07/03/22 General:    white male in NAD on vent but nods to questioning Psych:  Cooperative. Normal mood and affect.  Intake/Output from previous day: 12/14 0701 - 12/15 0700 In: 3613.3 [I.V.:3064.7; IV Piggyback:548.7] Out: 1750 [Urine:1750] Intake/Output this shift: No intake/output data recorded.  Lab Results: Recent Labs    07/04/22 1325 07/04/22 2306 07/05/22 0423  WBC 18.7* 21.7* 13.4*  HGB 9.5* 8.8* 7.5*  HCT 26.5* 25.0* 22.8*  PLT 110* 98* 66*   BMET Recent Labs    07/02/22 2300 07/02/22 2310 07/04/22 0420 07/05/22 0423  NA 140 142 140 138  K 4.1 4.1 3.8 3.6  CL 112* 110 114* 109  CO2 15*  --  21* 22  GLUCOSE 175* 166* 129* 118*  BUN 23* 25* 22* 17  CREATININE 0.78 0.60* 0.79 0.83  CALCIUM 7.8*  --  7.2* 7.7*   LFT Recent Labs    07/05/22 0423  PROT 4.8*  ALBUMIN 2.6*  AST 41  ALT 18  ALKPHOS 34*  BILITOT 3.1*   PT/INR Recent Labs    07/04/22 0420 07/05/22 0423  LABPROT 19.1* 20.3*  INR 1.6* 1.8*    Studies/Results:      Assessment / Plan:    51 y/o male admitted with the following:  Severe upper GI bleed secondary to gastric varices Cirrhosis secondary to alcohol use  He is now s/p BRTO yesterday with IR which appears to have gone well. Hgb has drifted likely due to equilibration, he has not had any overt  bleeding and BUN continues to downtrend which is reassuring. He remains on octreotide drip and IV protonix. Remains on some pressor and given albumin overnight, trying to wean off pressors. Is alert and responsive, hopefully extubated today.   Recommend 5 days of octreotide drip total and then stop. Protonix drip can go 72 hours and then can switch protonix to '40mg'$  BID as well. IR recommending repeat CTA in 48 hours to assess response to BRTO and a repeat EGD in 4-6 weeks to reassess gastric varices as an outpatient. Upon discharge, if his blood pressure allows he can be placed on Coreg, however will see how he does the rest of the hospital course and if he can tolerate it. He has not established with [place] as outpatient yet, he has seen Dr. Rush Landmark in the hospital previously. He will need close outpatient follow up and we can coordinate that if he chooses to see [place] post hospitalization. Otherwise continue supportive care for now.   PLAN: - monitor Hgb and for recurrent bleeding - continue octreotide drip for 5 days total from start time - continue protonix drip for total 72 hours, then can transition protonix to '40mg'$  BID and continue upon discharge - timing of extubation per primary team - if otherwise stable, clear liquids  would be okay today once extubated - repeat CTA in 48 hours per IR - repeat EGD in 4-6 weeks as outpatient - upon discharge can start Coreg for portal hypertension if BP allows - he will need close follow up upon discharge. He has not established with [place] yet but will make him an appointment should he choose to continue care with [place] - Dr. Rush Landmark  We will sign off for now, please call with questions / concerns in the interim during the remainder of his hospitalization.  Jolly Mango, MD [facility]

## 2022-07-05 NOTE — Telephone Encounter (Signed)
-----   Message from Yetta Flock, MD sent at 07/05/2022  7:49 AM EST ----- Regarding: GM follow up This patient will be discharged within the next week or so likely. He will need close outpatient follow up with GM or APP in the next 2-3 weeks, if you can help coordinate. Thanks

## 2022-07-06 DIAGNOSIS — I8500 Esophageal varices without bleeding: Secondary | ICD-10-CM

## 2022-07-06 DIAGNOSIS — K920 Hematemesis: Secondary | ICD-10-CM

## 2022-07-06 DIAGNOSIS — I864 Gastric varices: Secondary | ICD-10-CM

## 2022-07-06 DIAGNOSIS — E440 Moderate protein-calorie malnutrition: Secondary | ICD-10-CM | POA: Insufficient documentation

## 2022-07-06 DIAGNOSIS — R579 Shock, unspecified: Secondary | ICD-10-CM

## 2022-07-06 DIAGNOSIS — K766 Portal hypertension: Secondary | ICD-10-CM

## 2022-07-06 LAB — TYPE AND SCREEN
ABO/RH(D): O POS
Antibody Screen: NEGATIVE
Unit division: 0
Unit division: 0
Unit division: 0
Unit division: 0
Unit division: 0
Unit division: 0
Unit division: 0
Unit division: 0
Unit division: 0
Unit division: 0
Unit division: 0

## 2022-07-06 LAB — BPAM RBC
Blood Product Expiration Date: 202312262359
Blood Product Expiration Date: 202401012359
Blood Product Expiration Date: 202401022359
Blood Product Expiration Date: 202401052359
Blood Product Expiration Date: 202401052359
Blood Product Expiration Date: 202401072359
Blood Product Expiration Date: 202401072359
Blood Product Expiration Date: 202401072359
Blood Product Expiration Date: 202401082359
Blood Product Expiration Date: 202401082359
Blood Product Expiration Date: 202401122359
ISSUE DATE / TIME: 202312122253
ISSUE DATE / TIME: 202312122253
ISSUE DATE / TIME: 202312122341
ISSUE DATE / TIME: 202312122341
ISSUE DATE / TIME: 202312130210
ISSUE DATE / TIME: 202312130210
ISSUE DATE / TIME: 202312130246
ISSUE DATE / TIME: 202312130246
ISSUE DATE / TIME: 202312141422
ISSUE DATE / TIME: 202312142141
ISSUE DATE / TIME: 202312151533
Unit Type and Rh: 5100
Unit Type and Rh: 5100
Unit Type and Rh: 5100
Unit Type and Rh: 5100
Unit Type and Rh: 5100
Unit Type and Rh: 5100
Unit Type and Rh: 5100
Unit Type and Rh: 5100
Unit Type and Rh: 5100
Unit Type and Rh: 5100
Unit Type and Rh: 5100

## 2022-07-06 LAB — COMPREHENSIVE METABOLIC PANEL
ALT: 19 U/L (ref 0–44)
AST: 50 U/L — ABNORMAL HIGH (ref 15–41)
Albumin: 2.5 g/dL — ABNORMAL LOW (ref 3.5–5.0)
Alkaline Phosphatase: 36 U/L — ABNORMAL LOW (ref 38–126)
Anion gap: 7 (ref 5–15)
BUN: 15 mg/dL (ref 6–20)
CO2: 22 mmol/L (ref 22–32)
Calcium: 7.9 mg/dL — ABNORMAL LOW (ref 8.9–10.3)
Chloride: 108 mmol/L (ref 98–111)
Creatinine, Ser: 0.7 mg/dL (ref 0.61–1.24)
GFR, Estimated: >60 mL/min (ref >60)
Glucose, Bld: 106 mg/dL — ABNORMAL HIGH (ref 70–99)
Potassium: 3.4 mmol/L — ABNORMAL LOW (ref 3.5–5.1)
Sodium: 137 mmol/L (ref 135–145)
Total Bilirubin: 4.8 mg/dL — ABNORMAL HIGH (ref 0.3–1.2)
Total Protein: 5 g/dL — ABNORMAL LOW (ref 6.5–8.1)

## 2022-07-06 LAB — CBC
HCT: 23.5 % — ABNORMAL LOW (ref 39.0–52.0)
Hemoglobin: 7.8 g/dL — ABNORMAL LOW (ref 13.0–17.0)
MCH: 32.2 pg (ref 26.0–34.0)
MCHC: 33.2 g/dL (ref 30.0–36.0)
MCV: 97.1 fL (ref 80.0–100.0)
Platelets: 56 10*3/uL — ABNORMAL LOW (ref 150–400)
RBC: 2.42 MIL/uL — ABNORMAL LOW (ref 4.22–5.81)
RDW: 20.5 % — ABNORMAL HIGH (ref 11.5–15.5)
WBC: 9.5 10*3/uL (ref 4.0–10.5)
nRBC: 0.2 % (ref 0.0–0.2)

## 2022-07-06 LAB — PROTIME-INR
INR: 1.6 — ABNORMAL HIGH (ref 0.8–1.2)
Prothrombin Time: 19 seconds — ABNORMAL HIGH (ref 11.4–15.2)

## 2022-07-06 MED ORDER — POTASSIUM CHLORIDE CRYS ER 20 MEQ PO TBCR
40.0000 meq | EXTENDED_RELEASE_TABLET | Freq: Once | ORAL | Status: AC
  Administered 2022-07-06: 40 meq via ORAL
  Filled 2022-07-06: qty 2

## 2022-07-06 MED ORDER — CARVEDILOL 3.125 MG PO TABS
3.1250 mg | ORAL_TABLET | Freq: Two times a day (BID) | ORAL | Status: DC
  Administered 2022-07-06 – 2022-07-08 (×5): 3.125 mg via ORAL
  Filled 2022-07-06 (×5): qty 1

## 2022-07-06 NOTE — Progress Notes (Signed)
At 2:17 am- Bladder scan volume:-774m Patient self void;-252mPatient stated he would like to try and void by himself after an hour,unable to void ,did  in and out catheterization,could only drain 8080mf urine. Repeated bladder scan volume;-488m61malled E-link,asked to monitor the pt for now as long as he is comfortable and repeat bladder scan one more time before the end of shift.Pt stated he is comfortable.

## 2022-07-06 NOTE — Evaluation (Signed)
Physical Therapy Evaluation/ Discharge Patient Details Name: Stephen Miranda MRN: [id number] DOB: 06/21/1971 Today's Date: 07/06/2022  History of Present Illness  51 yo male admitted 12/12 with hematemesis with gastric and esophageal varices. Intubated 12/13-12/15,  12/14 IR TIPS. Acute urinary retention.  PMhx: dermatitis, melanoma, insomnia  Clinical Impression  Pt pleasant, reports living alone in 2nd floor apartment with parents able to assist at D/C. Pt able to perform tranfers and gait without assist. Pt with HR 120 with gait and SpO2 maintained 98% on RA. Pt encouraged to continue hall ambulation acutely and does not demonstrate further need for acute therapy with pt aware and in agreement. Pt able to stand to void at urinal and brush teeth in standing without assist.        Recommendations for follow up therapy are one component of a multi-disciplinary discharge planning process, led by the attending physician.  Recommendations may be updated based on patient status, additional functional criteria and insurance authorization.  Follow Up Recommendations No PT follow up      Assistance Recommended at Discharge PRN  Patient can return home with the following       Equipment Recommendations None recommended by PT  Recommendations for Other Services       Functional Status Assessment Patient has not had a recent decline in their functional status     Precautions / Restrictions Precautions Precautions: None Restrictions Weight Bearing Restrictions: No      Mobility  Bed Mobility Overal bed mobility: Modified Independent                  Transfers Overall transfer level: Independent                      Ambulation/Gait Ambulation/Gait assistance: Independent Gait Distance (Feet): 400 Feet Assistive device: None Gait Pattern/deviations: WFL(Within Functional Limits)   Gait velocity interpretation: >4.37 ft/sec, indicative of normal walking speed       Stairs            Wheelchair Mobility    Modified Rankin (Stroke Patients Only)       Balance Overall balance assessment: No apparent balance deficits (not formally assessed)                                           Pertinent Vitals/Pain Pain Assessment Pain Assessment: No/denies pain    Home Living Family/patient expects to be discharged to:: Private residence Living Arrangements: Alone Available Help at Discharge: Family;Available 24 hours/day Type of Home: House Home Access: Stairs to enter   [employer] of Steps: 3   Home Layout: Two level;Able to live on main level with bedroom/bathroom Home Equipment: None Additional Comments: pt lives in 2nd floor apt but plans to stay on first floor of parents house at D/C, currently unemployed    Prior Function Prior Level of Function : Independent/Modified Independent                     Hand Dominance        Extremity/Trunk Assessment   Upper Extremity Assessment Upper Extremity Assessment: Overall WFL for tasks assessed    Lower Extremity Assessment Lower Extremity Assessment: Overall WFL for tasks assessed    Cervical / Trunk Assessment Cervical / Trunk Assessment: Normal  Communication   Communication: No difficulties  Cognition Arousal/Alertness: Awake/alert Behavior During Therapy: [facility]  for tasks assessed/performed Overall Cognitive Status: Within Functional Limits for tasks assessed                                          General Comments      Exercises     Assessment/Plan    PT Assessment Patient does not need any further PT services  PT Problem List         PT Treatment Interventions      PT Goals (Current goals can be found in the Care Plan section)  Acute Rehab PT Goals PT Goal Formulation: All assessment and education complete, DC therapy    Frequency       Co-evaluation               AM-PAC PT "6 Clicks"  Mobility  Outcome Measure Help needed turning from your back to your side while in a flat bed without using bedrails?: None Help needed moving from lying on your back to sitting on the side of a flat bed without using bedrails?: None Help needed moving to and from a bed to a chair (including a wheelchair)?: None Help needed standing up from a chair using your arms (e.g., wheelchair or bedside chair)?: None Help needed to walk in hospital room?: None Help needed climbing 3-5 steps with a railing? : None 6 Click Score: 24    End of Session   Activity Tolerance: Patient tolerated treatment well Patient left: in chair;with call bell/phone within reach Nurse Communication: Mobility status PT Visit Diagnosis: Other abnormalities of gait and mobility (R26.89)    Time: 2409-7353 PT Time Calculation (min) (ACUTE ONLY): 28 min   Charges:   PT Evaluation $PT Eval Low Complexity: 1 Low          [place], PT Acute Rehabilitation Services Office: [place] 07/06/2022, 10:00 AM

## 2022-07-06 NOTE — Plan of Care (Signed)

## 2022-07-06 NOTE — Progress Notes (Signed)
PROGRESS NOTE    Stephen Miranda  JOA:[id number] DOB: 08-28-1970 DOA: 07/02/2022 PCP: Stephen Hook, MD    Chief Complaint  Patient presents with   Hematemesis    Brief Narrative:   Stephen Miranda is a 51 yo man with hx of etoh use (sober 4 years), hx of gastric duodenal and esophageal varices here  with hematemesis.  Began at noon.  Dizzy and weak.  EMS reported an estimated 1L blood on floor.   Hx of melena.   Initially tachycardic and hypotensive.   In ED given protonix infusion, octreotide, reglan, fentanyl 50 x 2, ctx.  +1L  3 unit blood given, 4th stopped due to possible local reaction.  Patient became more altered, more lethargic, hypotensive.  Responded to rapid infusion of blood (4 u and ffp).     Multiple large episodes bright red emesis, large volume, also large volume melena.   12/13 admit, EGD 12/15 BRTO   Assessment & Plan:   Principal Problem:   Hematemesis Active Problems:   Alcoholic cirrhosis ([place])   Portal hypertension (HCC)   Bleeding gastric varices   Esophageal and gastric varices (HCC)   Shock (HCC)   Pressure injury of skin   Malnutrition of moderate degree  Acute blood loss anemia Severe upper GI bleed secondary to gastric varices Cirrhosis to history of alcohol abuse(sober for last 4 years) Hemorrhagic shock Respiratory failure, requiring ventilation for airway protection Thrombocytopenia -Intubated initially for airway protection, given cute blood loss anemia in the setting of GI bleed. -GI input greatly appreciated, continue octreotide x 5 days, continue with IV Rocephin, continue Protonix drip for total 72 hours then changed to 40 mg p.o. twice daily. - S/P BRTO 12/14 afternoon, management per IR, plan to repeat CTA in 48 hours per IR -Will need EGD in 4 to 6 weeks -Will need Coreg upon discharge if blood pressure allows -Continue to monitor hemoglobin and transfuse as needed, -Received 3 units so far of PRBCs -Clear liquid diet, will  advance to full liquid today.  Hypokalemia -Repleted   Urinary retention -Remains with high post residual volume readings, he was encouraged to ambulate, continue to monitor her skin closely.     DVT prophylaxis: SCD Code Status: Full Family Communication: None at bedside Disposition:   Status is: Inpatient    Consultants:  PCCM IR GI  Subjective:  Ports bowel movements overnight, normal in color, no blood present,  Objective: Vitals:   07/06/22 0434 07/06/22 0500 07/06/22 0800 07/06/22 0958  BP: (!) 115/50  119/60   Pulse: 95  (!) 103 (!) 120  Resp: 14  15   Temp: 98.2 F (36.8 C)  98.4 F (36.9 C)   TempSrc: Oral  Oral   SpO2: 96%  96% 98%  Weight:  93.7 kg    Height:        Intake/Output Summary (Last 24 hours) at 07/06/2022 1054 Last data filed at 07/06/2022 0930 Gross per 24 hour  Intake 1369.57 ml  Output 2611 ml  Net -1241.43 ml   Filed Weights   07/04/22 0500 07/05/22 0400 07/06/22 0500  Weight: 84.8 kg 86.1 kg 93.7 kg    Examination:  Awake Alert, Oriented X 3, No new F.N deficits, Normal affect Symmetrical Chest wall movement, Good air movement bilaterally, CTAB RRR,No Gallops,Rubs or new Murmurs, No Parasternal Heave +ve B.Sounds, Abd Soft, No tenderness, No rebound - guarding or rigidity. No Cyanosis, Clubbing or edema, No new Rash or bruise  Data Reviewed: I have personally reviewed following labs and imaging studies  CBC: Recent Labs  Lab 07/02/22 2300 07/02/22 2310 07/03/22 0331 07/03/22 1037 07/04/22 1325 07/04/22 2306 07/05/22 0423 07/05/22 1444 07/05/22 1928 07/06/22 0205  WBC 25.3*  --  18.7*   < > 18.7* 21.7* 13.4* 9.1  --  9.5  NEUTROABS 21.8*  --  16.8*  --   --   --   --   --   --   --   HGB 5.9*   < > 9.7*   < > 9.5* 8.8* 7.5* 6.8* 7.3* 7.8*  HCT 17.7*   < > 27.4*   < > 26.5* 25.0* 22.8* 20.4* 21.4* 23.5*  MCV 106.6*  --  91.9   < > 92.0 92.6 97.9 99.5  --  97.1  PLT 147*  --  53*   < > 110* 98* 66* 47*   --  56*   < > = values in this interval not displayed.    Basic Metabolic Panel: Recent Labs  Lab 07/02/22 2300 07/02/22 2310 07/04/22 0420 07/05/22 0423 07/05/22 0424 07/06/22 0205  NA 140 142 140 138  --  137  K 4.1 4.1 3.8 3.6  --  3.4*  CL 112* 110 114* 109  --  108  CO2 15*  --  21* 22  --  22  GLUCOSE 175* 166* 129* 118*  --  106*  BUN 23* 25* 22* 17  --  15  CREATININE 0.78 0.60* 0.79 0.83  --  0.70  CALCIUM 7.8*  --  7.2* 7.7*  --  7.9*  PHOS  --   --   --   --  2.9  --     GFR: Estimated Creatinine Clearance: 124.8 mL/min (by C-G formula based on SCr of 0.7 mg/dL).  Liver Function Tests: Recent Labs  Lab 07/02/22 2300 07/04/22 0420 07/05/22 0423 07/06/22 0205  AST 50* 50* 41 50*  ALT '17 19 18 19  '$ ALKPHOS 57 37* 34* 36*  BILITOT 2.4* 3.2* 3.1* 4.8*  PROT 5.1* 4.5* 4.8* 5.0*  ALBUMIN 2.1* 2.0* 2.6* 2.5*    CBG: Recent Labs  Lab 07/03/22 0513 07/03/22 1147 07/03/22 1641 07/04/22 0347  GLUCAP 150* 122* 120* 101*     Recent Results (from the past 240 hour(s))  Culture, blood (Routine X 2) w Reflex to ID Panel     Status: None (Preliminary result)   Collection Time: 07/03/22  3:45 AM   Specimen: BLOOD  Result Value Ref Range Status   Specimen Description BLOOD SITE NOT SPECIFIED  Final   Special Requests   Final    BOTTLES DRAWN AEROBIC AND ANAEROBIC Blood Culture adequate volume   Culture   Final    NO GROWTH 3 DAYS Performed at [facility], [place] [address] [place], [place] [address]    Report Status PENDING  Incomplete  Culture, blood (Routine X 2) w Reflex to ID Panel     Status: None (Preliminary result)   Collection Time: 07/03/22  6:19 AM   Specimen: BLOOD RIGHT FOREARM  Result Value Ref Range Status   Specimen Description BLOOD RIGHT FOREARM  Final   Special Requests   Final    BOTTLES DRAWN AEROBIC AND ANAEROBIC Blood Culture adequate volume   Culture   Final    NO GROWTH 3 DAYS Performed at [facility], [place]  [address] [place],  [address]    Report Status PENDING  Incomplete  Radiology Studies: CT Angio Abd/Pel w/ and/or w/o  Result Date: 07/05/2022 CLINICAL DATA:  51 year old male with history of portal hypertension hematemesis status post transvenous obliteration of gastroesophageal varices on 07/04/2022. EXAM: CTA ABDOMEN AND PELVIS WITHOUT AND WITH CONTRAST TECHNIQUE: Multidetector CT imaging of the abdomen and pelvis was performed using the standard protocol during bolus administration of intravenous contrast. Multiplanar reconstructed images and MIPs were obtained and reviewed to evaluate the vascular anatomy. RADIATION DOSE REDUCTION: This exam was performed according to the departmental dose-optimization program which includes automated exposure control, adjustment of the mA and/or kV according to patient size and/or use of iterative reconstruction technique. CONTRAST:  51m OMNIPAQUE IOHEXOL 350 MG/ML SOLN COMPARISON:  07/04/2022, 07/03/2022 FINDINGS: VASCULAR Aorta: Normal caliber aorta without aneurysm, dissection, vasculitis or significant stenosis. Mild scattered infrarenal atherosclerotic calcification. Celiac: Patent without evidence of aneurysm, dissection, vasculitis or significant stenosis. SMA: Patent without evidence of aneurysm, dissection, vasculitis or significant stenosis. Renals: Both renal arteries are patent without evidence of aneurysm, dissection, vasculitis, fibromuscular dysplasia or significant stenosis. IMA: Patent without evidence of aneurysm, dissection, vasculitis or significant stenosis. Inflow: Patent without evidence of aneurysm, dissection, vasculitis or significant stenosis. Proximal Outflow: Bilateral common femoral and visualized portions of the superficial and profunda femoral arteries are patent without evidence of aneurysm, dissection, vasculitis or significant stenosis. Veins: The hepatic veins are poorly opacified, however appear patent. The intrahepatic  portal veins, main vein, superior mesenteric vein, and inferior mesenteric vein are widely patent and normal in caliber. Limited visualization of the splenic vein due to streak artifact. Status post coil assisted transvenous obliteration via left gastro renal shunt with radiopaque left fide all material within esophageal and gastric varices. Large coil pack about the inferior aspect of the gastro renal shunt. The left gastric vein is patent. Previously visualized posterior gastric vein is difficult to visualize due to streak artifact. The renal veins are patent bilaterally with retroaortic left renal vein. There is similar appearing compression of left renal vein by the overlying aorta. No evidence of iliocaval thrombosis or anomaly. Similar appearing moderate compression of left common iliac vein the overlying right common iliac artery. Review of the MIP images confirms the above findings. NON-VASCULAR Lower chest: Bibasilar subsegmental atelectasis. Hepatobiliary: Mildly shrunken right lobe of the liver with left lobe hypertrophy. Diffuse macronodular contour. Scattered well-defined water density hypoattenuating rounded lesions throughout the hepatic parenchyma, unchanged. The gallbladder is present and unremarkable. No intra or extrahepatic biliary ductal dilation. Pancreas: Unremarkable. No pancreatic ductal dilatation or surrounding inflammatory changes. Spleen: Normal in size without focal abnormality. Adrenals/Urinary Tract: Adrenal glands are unremarkable. Unchanged scattered simple renal cysts. Kidneys are otherwise normal, without renal calculi, focal lesion, or hydronephrosis. Bladder is unremarkable. Stomach/Bowel: Interval transvenous obliteration of previously visualized large gastric varices with intraluminal lipiodol visualized in the medial gastric cardia. The remaining stomach is within normal limits. No evidence of significant bowel distension or wall thickening. Lymphatic: No abdominopelvic  lymphadenopathy. Reproductive: Similar mild prostatomegaly. Other: Similar trace ascites. Musculoskeletal: No acute or significant osseous findings. IMPRESSION: 1. Postprocedural changes status post coil assisted transvenous obliteration of gastroesophageal varices, no complicating features. 2. Similar appearing cirrhotic stigmata and trace ascites. DRuthann Cancer MD [facility] Electronically Signed   By: DRuthann CancerM.D.   On: 07/05/2022 12:37   IR Venogram Renal Uni Left  Result Date: 07/04/2022 CLINICAL DATA:  LARGE BLEEDING GASTRIC VARICES RESULTING IN ACUTE BLOOD LOSS ANEMIA EXAM: ULTRASOUND GUIDANCE FOR VASCULAR ACCESS LEFT RENAL CATHETERIZATION  AND VENOGRAM LEFT GASTRO RENAL SHUNT CATHETERIZATION, VENOGRAM BALLOON OCCLUDED RETROGRADE TRANSVENOUS OBLITERATION (BRTO OF THE GASTRIC VARICES MEDICATIONS: As antibiotic prophylaxis, 2 G ANCEF was ordered pre-procedure and administered intravenously within one hour of incision. ANESTHESIA/SEDATION: General - as administered by the Anesthesia department CONTRAST:  65 cc Isovue 300 FLUOROSCOPY: Radiation Exposure Index (as provided by the fluoroscopic device): 540 mGy Kerma COMPLICATIONS: None immediate. PROCEDURE: Informed written consent was obtained from the patient's mother Gwenette Greet Kosch) after a thorough discussion of the procedural risks, benefits and alternatives. All questions were addressed. Maximal Sterile Barrier Technique was utilized including caps, mask, sterile gowns, sterile gloves, sterile drape, hand hygiene and skin antiseptic. A timeout was performed prior to the initiation of the procedure. Previous imaging reviewed. Under sterile conditions and general anesthesia, right common femoral vein micropuncture access performed with ultrasound. Images obtained for documentation of the patent right common femoral vein. Guidewire inserted easily followed by the micro dilator set. Tract  dilatation performed to insert the 35 cm 10 French braided TIPS sheath into the IVC. Five [place] Kumpe catheter advanced into the left renal vein. Initial left renal venogram performed. Left renal vein is widely patent. Large gastro renal shunt noted superiorly correlating with the CT findings. Kumpe catheter and Glidewire utilized to access the gastro renal shunt. Five French catheter advanced into the gastro renal shunt over a Glidewire. Initial shunt venogram performed. Gastro renal shunt is incompletely opacified but widely patent. Amplatz guidewire inserted. Ten French sheath advanced into the orifice of the gastro renal shunt. 11.5 mm balloon occlusion catheter advanced over the Amplatz guidewire into the gastro renal shunt past the level of the outflow valve. 11.5 mm balloon was inflated for balloon occlusion. Retrograde CO2 balloon occluded venogram performed. CO2 venogram: This confirms the large gastric varices with retrograde communication visualized via the posterior gastric to the splenic vein and the left gastric vein to the portal mesenteric confluence. Mesenteric and portal veins are also patent and normal in caliber. The double marker lantern microcatheter was advanced through the balloon occlusion catheter into the gastric varix. Contrast injection confirms position. Balloon occluded retrograde transvenous obliteration performed of the gastric varices by slowly instilling approximally 40 cc total volume of foam sclerotherapy (mixture of 6 cc air, 4 cc 3% ST S, and 2 cc Lipiodol). Reflux was visualized within the posterior gastric vein communicating with the splenic vein. At this point, no further sclerotherapy injected. Lantern catheter was removed and flushed. This was replaced within the gastro renal shunt above the balloon occlusion catheter. Coil embolization performed by deploying a total of 14 Ruby coils ranging in size from 28-40 mm and 60 cm length. Several additional packing coils were  also deployed for the coil pack. Following this, the balloon was left inflated for 10 minutes. Under fluoroscopy, the balloon was slowly deflated without any coil pack migration. Repeat venogram confirms occlusion of the gastro renal shunt and the gastric varices. Orifice of the gastro renal shunt into the renal vein remains patent. Balloon occlusion catheter removed. Final venogram performed confirming preserved patency of the left renal vein. Access removed. Hemostasis obtained with manual compression. Patient tolerated the procedure well. IMPRESSION: Successful balloon occluded retrograde transvenous obliteration (BRTO) of the gastric varices as detailed above. Follow-up CTA (BRTO protocol) will be performed 48 hours to assess for complete GV occlusion. Electronically Signed   By: Jerilynn Mages.  Shick M.D.   On: 07/04/2022 17:34   IR US Guide Vasc Access Right  Result Date: 07/04/2022 CLINICAL DATA:  LARGE BLEEDING GASTRIC VARICES RESULTING IN ACUTE BLOOD LOSS ANEMIA EXAM: ULTRASOUND GUIDANCE FOR VASCULAR ACCESS LEFT RENAL CATHETERIZATION AND VENOGRAM LEFT GASTRO RENAL SHUNT CATHETERIZATION, VENOGRAM BALLOON OCCLUDED RETROGRADE TRANSVENOUS OBLITERATION (BRTO OF THE GASTRIC VARICES MEDICATIONS: As antibiotic prophylaxis, 2 G ANCEF was ordered pre-procedure and administered intravenously within one hour of incision. ANESTHESIA/SEDATION: General - as administered by the Anesthesia department CONTRAST:  65 cc Isovue 300 FLUOROSCOPY: Radiation Exposure Index (as provided by the fluoroscopic device): 242 mGy Kerma COMPLICATIONS: None immediate. PROCEDURE: Informed written consent was obtained from the patient's mother Gwenette Greet Glazier) after a thorough discussion of the procedural risks, benefits and alternatives. All questions were addressed. Maximal Sterile Barrier Technique was utilized including caps, mask, sterile gowns, sterile gloves, sterile drape, hand hygiene and skin antiseptic. A timeout was performed prior to the  initiation of the procedure. Previous imaging reviewed. Under sterile conditions and general anesthesia, right common femoral vein micropuncture access performed with ultrasound. Images obtained for documentation of the patent right common femoral vein. Guidewire inserted easily followed by the micro dilator set. Tract dilatation performed to insert the 35 cm 10 French braided TIPS sheath into the IVC. Five [place] Kumpe catheter advanced into the left renal vein. Initial left renal venogram performed. Left renal vein is widely patent. Large gastro renal shunt noted superiorly correlating with the CT findings. Kumpe catheter and Glidewire utilized to access the gastro renal shunt. Five French catheter advanced into the gastro renal shunt over a Glidewire. Initial shunt venogram performed. Gastro renal shunt is incompletely opacified but widely patent. Amplatz guidewire inserted. Ten French sheath advanced into the orifice of the gastro renal shunt. 11.5 mm balloon occlusion catheter advanced over the Amplatz guidewire into the gastro renal shunt past the level of the outflow valve. 11.5 mm balloon was inflated for balloon occlusion. Retrograde CO2 balloon occluded venogram performed. CO2 venogram: This confirms the large gastric varices with retrograde communication visualized via the posterior gastric to the splenic vein and the left gastric vein to the portal mesenteric confluence. Mesenteric and portal veins are also patent and normal in caliber. The double marker lantern microcatheter was advanced through the balloon occlusion catheter into the gastric varix. Contrast injection confirms position. Balloon occluded retrograde transvenous obliteration performed of the gastric varices by slowly instilling approximally 40 cc total volume of foam sclerotherapy (mixture of 6 cc air, 4 cc 3% ST S, and 2 cc Lipiodol). Reflux was visualized within the posterior gastric vein communicating with the splenic vein. At this  point, no further sclerotherapy injected. Lantern catheter was removed and flushed. This was replaced within the gastro renal shunt above the balloon occlusion catheter. Coil embolization performed by deploying a total of 14 Ruby coils ranging in size from 28-40 mm and 60 cm length. Several additional packing coils were also deployed for the coil pack. Following this, the balloon was left inflated for 10 minutes. Under fluoroscopy, the balloon was slowly deflated without any coil pack migration. Repeat venogram confirms occlusion of the gastro renal shunt and the gastric varices. Orifice of the gastro renal shunt into the renal vein remains patent. Balloon occlusion catheter removed. Final venogram performed confirming preserved patency of the left renal vein. Access removed. Hemostasis obtained with manual compression. Patient tolerated the procedure well. IMPRESSION: Successful balloon occluded retrograde transvenous obliteration (BRTO) of the gastric varices as detailed above. Follow-up CTA (BRTO protocol) will be performed 48 hours to assess for complete GV occlusion. Electronically Signed   By: Jerilynn Mages.  Shick M.D.  On: 07/04/2022 17:34   IR Angiogram Selective Each Additional Vessel  Result Date: 07/04/2022 CLINICAL DATA:  LARGE BLEEDING GASTRIC VARICES RESULTING IN ACUTE BLOOD LOSS ANEMIA EXAM: ULTRASOUND GUIDANCE FOR VASCULAR ACCESS LEFT RENAL CATHETERIZATION AND VENOGRAM LEFT GASTRO RENAL SHUNT CATHETERIZATION, VENOGRAM BALLOON OCCLUDED RETROGRADE TRANSVENOUS OBLITERATION (BRTO OF THE GASTRIC VARICES MEDICATIONS: As antibiotic prophylaxis, 2 G ANCEF was ordered pre-procedure and administered intravenously within one hour of incision. ANESTHESIA/SEDATION: General - as administered by the Anesthesia department CONTRAST:  65 cc Isovue 300 FLUOROSCOPY: Radiation Exposure Index (as provided by the fluoroscopic device): 818 mGy Kerma COMPLICATIONS: None immediate. PROCEDURE: Informed written consent was obtained  from the patient's mother Gwenette Greet Hazell) after a thorough discussion of the procedural risks, benefits and alternatives. All questions were addressed. Maximal Sterile Barrier Technique was utilized including caps, mask, sterile gowns, sterile gloves, sterile drape, hand hygiene and skin antiseptic. A timeout was performed prior to the initiation of the procedure. Previous imaging reviewed. Under sterile conditions and general anesthesia, right common femoral vein micropuncture access performed with ultrasound. Images obtained for documentation of the patent right common femoral vein. Guidewire inserted easily followed by the micro dilator set. Tract dilatation performed to insert the 35 cm 10 French braided TIPS sheath into the IVC. Five [place] Kumpe catheter advanced into the left renal vein. Initial left renal venogram performed. Left renal vein is widely patent. Large gastro renal shunt noted superiorly correlating with the CT findings. Kumpe catheter and Glidewire utilized to access the gastro renal shunt. Five French catheter advanced into the gastro renal shunt over a Glidewire. Initial shunt venogram performed. Gastro renal shunt is incompletely opacified but widely patent. Amplatz guidewire inserted. Ten French sheath advanced into the orifice of the gastro renal shunt. 11.5 mm balloon occlusion catheter advanced over the Amplatz guidewire into the gastro renal shunt past the level of the outflow valve. 11.5 mm balloon was inflated for balloon occlusion. Retrograde CO2 balloon occluded venogram performed. CO2 venogram: This confirms the large gastric varices with retrograde communication visualized via the posterior gastric to the splenic vein and the left gastric vein to the portal mesenteric confluence. Mesenteric and portal veins are also patent and normal in caliber. The double marker lantern microcatheter was advanced through the balloon occlusion catheter into the gastric varix. Contrast injection  confirms position. Balloon occluded retrograde transvenous obliteration performed of the gastric varices by slowly instilling approximally 40 cc total volume of foam sclerotherapy (mixture of 6 cc air, 4 cc 3% ST S, and 2 cc Lipiodol). Reflux was visualized within the posterior gastric vein communicating with the splenic vein. At this point, no further sclerotherapy injected. Lantern catheter was removed and flushed. This was replaced within the gastro renal shunt above the balloon occlusion catheter. Coil embolization performed by deploying a total of 14 Ruby coils ranging in size from 28-40 mm and 60 cm length. Several additional packing coils were also deployed for the coil pack. Following this, the balloon was left inflated for 10 minutes. Under fluoroscopy, the balloon was slowly deflated without any coil pack migration. Repeat venogram confirms occlusion of the gastro renal shunt and the gastric varices. Orifice of the gastro renal shunt into the renal vein remains patent. Balloon occlusion catheter removed. Final venogram performed confirming preserved patency of the left renal vein. Access removed. Hemostasis obtained with manual compression. Patient tolerated the procedure well. IMPRESSION: Successful balloon occluded retrograde transvenous obliteration (BRTO) of the gastric varices as detailed above. Follow-up CTA (BRTO protocol) will be performed  48 hours to assess for complete GV occlusion. Electronically Signed   By: Jerilynn Mages.  Shick M.D.   On: 07/04/2022 17:34   IR EMBO ART  VEN HEMORR LYMPH EXTRAV  INC GUIDE ROADMAPPING  Result Date: 07/04/2022 CLINICAL DATA:  LARGE BLEEDING GASTRIC VARICES RESULTING IN ACUTE BLOOD LOSS ANEMIA EXAM: ULTRASOUND GUIDANCE FOR VASCULAR ACCESS LEFT RENAL CATHETERIZATION AND VENOGRAM LEFT GASTRO RENAL SHUNT CATHETERIZATION, VENOGRAM BALLOON OCCLUDED RETROGRADE TRANSVENOUS OBLITERATION (BRTO OF THE GASTRIC VARICES MEDICATIONS: As antibiotic prophylaxis, 2 G ANCEF was ordered  pre-procedure and administered intravenously within one hour of incision. ANESTHESIA/SEDATION: General - as administered by the Anesthesia department CONTRAST:  65 cc Isovue 300 FLUOROSCOPY: Radiation Exposure Index (as provided by the fluoroscopic device): 237 mGy Kerma COMPLICATIONS: None immediate. PROCEDURE: Informed written consent was obtained from the patient's mother Gwenette Greet Mojica) after a thorough discussion of the procedural risks, benefits and alternatives. All questions were addressed. Maximal Sterile Barrier Technique was utilized including caps, mask, sterile gowns, sterile gloves, sterile drape, hand hygiene and skin antiseptic. A timeout was performed prior to the initiation of the procedure. Previous imaging reviewed. Under sterile conditions and general anesthesia, right common femoral vein micropuncture access performed with ultrasound. Images obtained for documentation of the patent right common femoral vein. Guidewire inserted easily followed by the micro dilator set. Tract dilatation performed to insert the 35 cm 10 French braided TIPS sheath into the IVC. Five [place] Kumpe catheter advanced into the left renal vein. Initial left renal venogram performed. Left renal vein is widely patent. Large gastro renal shunt noted superiorly correlating with the CT findings. Kumpe catheter and Glidewire utilized to access the gastro renal shunt. Five French catheter advanced into the gastro renal shunt over a Glidewire. Initial shunt venogram performed. Gastro renal shunt is incompletely opacified but widely patent. Amplatz guidewire inserted. Ten French sheath advanced into the orifice of the gastro renal shunt. 11.5 mm balloon occlusion catheter advanced over the Amplatz guidewire into the gastro renal shunt past the level of the outflow valve. 11.5 mm balloon was inflated for balloon occlusion. Retrograde CO2 balloon occluded venogram performed. CO2 venogram: This confirms the large gastric varices with  retrograde communication visualized via the posterior gastric to the splenic vein and the left gastric vein to the portal mesenteric confluence. Mesenteric and portal veins are also patent and normal in caliber. The double marker lantern microcatheter was advanced through the balloon occlusion catheter into the gastric varix. Contrast injection confirms position. Balloon occluded retrograde transvenous obliteration performed of the gastric varices by slowly instilling approximally 40 cc total volume of foam sclerotherapy (mixture of 6 cc air, 4 cc 3% ST S, and 2 cc Lipiodol). Reflux was visualized within the posterior gastric vein communicating with the splenic vein. At this point, no further sclerotherapy injected. Lantern catheter was removed and flushed. This was replaced within the gastro renal shunt above the balloon occlusion catheter. Coil embolization performed by deploying a total of 14 Ruby coils ranging in size from 28-40 mm and 60 cm length. Several additional packing coils were also deployed for the coil pack. Following this, the balloon was left inflated for 10 minutes. Under fluoroscopy, the balloon was slowly deflated without any coil pack migration. Repeat venogram confirms occlusion of the gastro renal shunt and the gastric varices. Orifice of the gastro renal shunt into the renal vein remains patent. Balloon occlusion catheter removed. Final venogram performed confirming preserved patency of the left renal vein. Access removed. Hemostasis obtained with manual compression. Patient tolerated  the procedure well. IMPRESSION: Successful balloon occluded retrograde transvenous obliteration (BRTO) of the gastric varices as detailed above. Follow-up CTA (BRTO protocol) will be performed 48 hours to assess for complete GV occlusion. Electronically Signed   By: Jerilynn Mages.  Shick M.D.   On: 07/04/2022 17:34        Scheduled Meds:  Chlorhexidine Gluconate Cloth  6 each Topical Daily   feeding supplement  237  mL Oral TID BM   multivitamin with minerals  1 tablet Oral Daily   pantoprazole  40 mg Intravenous Q12H   thiamine  100 mg Oral Daily   Continuous Infusions:  sodium chloride Stopped (07/05/22 1252)   cefTRIAXone (ROCEPHIN)  IV 1 g (07/05/22 2143)   fentaNYL infusion INTRAVENOUS Stopped (07/05/22 0747)   octreotide (SANDOSTATIN) 500 mcg in sodium chloride 0.9 % 250 mL (2 mcg/mL) infusion 50 mcg/hr (07/06/22 0815)     LOS: 3 days       Phillips Climes, MD [facility]   To contact the attending provider between 7A-7P or the covering provider during after hours 7P-7A, please log into the web site [email or web address] and access using universal [place] password for that web site. If you do not have the password, please call the hospital operator.  07/06/2022, 10:54 AM

## 2022-07-06 NOTE — Plan of Care (Signed)
  Problem: Education: Goal: Knowledge of General Education information will improve Description: Including pain rating scale, medication(s)/side effects and non-pharmacologic comfort measures Outcome: Progressing   Problem: Health Behavior/Discharge Planning: Goal: Ability to manage health-related needs will improve Outcome: Progressing   Problem: Clinical Measurements: Goal: Ability to maintain clinical measurements within normal limits will improve Outcome: Progressing   Problem: Activity: Goal: Risk for activity intolerance will decrease Outcome: Progressing   Problem: Nutrition: Goal: Adequate nutrition will be maintained Outcome: Progressing   Problem: Elimination: Goal: Will not experience complications related to bowel motility Outcome: Progressing   Problem: Safety: Goal: Ability to remain free from injury will improve Outcome: Progressing   Problem: Skin Integrity: Goal: Risk for impaired skin integrity will decrease Outcome: Progressing   Problem: Activity: Goal: Ability to return to baseline activity level will improve Outcome: Progressing

## 2022-07-06 NOTE — Progress Notes (Signed)
Pre void Bladder scan volume is 526m. Patient voided 2015mPost void Bladder scan volume is 49345m

## 2022-07-06 NOTE — Progress Notes (Signed)
OT Cancellation Note  Patient Details Name: Stephen Miranda MRN: [id number] DOB: 03-May-1971   Cancelled Treatment:    Reason Eval/Treat Not Completed: OT screened, no needs identified, will sign off  Metta Clines 07/06/2022, 11:20 AM 07/06/2022  RP, OTR/L  [facility]  Office:  [phone]

## 2022-07-07 ENCOUNTER — Encounter (HOSPITAL_COMMUNITY): Payer: Self-pay | Admitting: Internal Medicine

## 2022-07-07 LAB — PROTIME-INR
INR: 1.7 — ABNORMAL HIGH (ref 0.8–1.2)
Prothrombin Time: 20.1 seconds — ABNORMAL HIGH (ref 11.4–15.2)

## 2022-07-07 LAB — COMPREHENSIVE METABOLIC PANEL
ALT: 20 U/L (ref 0–44)
AST: 50 U/L — ABNORMAL HIGH (ref 15–41)
Albumin: 2.4 g/dL — ABNORMAL LOW (ref 3.5–5.0)
Alkaline Phosphatase: 51 U/L (ref 38–126)
Anion gap: 6 (ref 5–15)
BUN: 14 mg/dL (ref 6–20)
CO2: 22 mmol/L (ref 22–32)
Calcium: 7.6 mg/dL — ABNORMAL LOW (ref 8.9–10.3)
Chloride: 105 mmol/L (ref 98–111)
Creatinine, Ser: 0.68 mg/dL (ref 0.61–1.24)
GFR, Estimated: >60 mL/min (ref >60)
Glucose, Bld: 96 mg/dL (ref 70–99)
Potassium: 3.4 mmol/L — ABNORMAL LOW (ref 3.5–5.1)
Sodium: 133 mmol/L — ABNORMAL LOW (ref 135–145)
Total Bilirubin: 3.3 mg/dL — ABNORMAL HIGH (ref 0.3–1.2)
Total Protein: 4.9 g/dL — ABNORMAL LOW (ref 6.5–8.1)

## 2022-07-07 LAB — CBC
HCT: 21.4 % — ABNORMAL LOW (ref 39.0–52.0)
HCT: 24.6 % — ABNORMAL LOW (ref 39.0–52.0)
Hemoglobin: 7.2 g/dL — ABNORMAL LOW (ref 13.0–17.0)
Hemoglobin: 8.2 g/dL — ABNORMAL LOW (ref 13.0–17.0)
MCH: 32.9 pg (ref 26.0–34.0)
MCH: 32.9 pg (ref 26.0–34.0)
MCHC: 33.6 g/dL (ref 30.0–36.0)
MCHC: 33.3 g/dL (ref 30.0–36.0)
MCV: 97.7 fL (ref 80.0–100.0)
MCV: 98.8 fL (ref 80.0–100.0)
Platelets: 66 10*3/uL — ABNORMAL LOW (ref 150–400)
Platelets: 83 10*3/uL — ABNORMAL LOW (ref 150–400)
RBC: 2.19 MIL/uL — ABNORMAL LOW (ref 4.22–5.81)
RBC: 2.49 MIL/uL — ABNORMAL LOW (ref 4.22–5.81)
RDW: 20.1 % — ABNORMAL HIGH (ref 11.5–15.5)
RDW: 19.7 % — ABNORMAL HIGH (ref 11.5–15.5)
WBC: 10.2 10*3/uL (ref 4.0–10.5)
WBC: 9.8 10*3/uL (ref 4.0–10.5)
nRBC: 0 % (ref 0.0–0.2)
nRBC: 0 % (ref 0.0–0.2)

## 2022-07-07 MED ORDER — TAMSULOSIN HCL 0.4 MG PO CAPS
0.4000 mg | ORAL_CAPSULE | Freq: Every day | ORAL | Status: DC
  Administered 2022-07-07: 0.4 mg via ORAL
  Filled 2022-07-07: qty 1

## 2022-07-07 MED ORDER — POTASSIUM CHLORIDE CRYS ER 20 MEQ PO TBCR
40.0000 meq | EXTENDED_RELEASE_TABLET | Freq: Four times a day (QID) | ORAL | Status: AC
  Administered 2022-07-07 (×2): 40 meq via ORAL
  Filled 2022-07-07 (×2): qty 2

## 2022-07-07 MED ORDER — PHYTONADIONE 5 MG PO TABS
5.0000 mg | ORAL_TABLET | Freq: Once | ORAL | Status: AC
  Administered 2022-07-07: 5 mg via ORAL
  Filled 2022-07-07: qty 1

## 2022-07-07 MED ORDER — TAMSULOSIN HCL 0.4 MG PO CAPS: 0.4000 mg | ORAL_CAPSULE | Freq: Every day | ORAL | Status: DC

## 2022-07-07 NOTE — Progress Notes (Signed)
Pt ambulated independently on hallway

## 2022-07-07 NOTE — Plan of Care (Signed)

## 2022-07-07 NOTE — Progress Notes (Signed)
PROGRESS NOTE    Stephen Miranda  GYJ:[id number] DOB: [birth year] DOA: 07/02/2022 PCP: Mack Hook, MD    Chief Complaint  Patient presents with   Hematemesis    Brief Narrative:   [name] is a 51 yo man with hx of etoh use (sober 4 years), hx of gastric duodenal and esophageal varices here  with hematemesis.  Began at noon.  Dizzy and weak.  EMS reported an estimated 1L blood on floor.   Hx of melena.   Initially tachycardic and hypotensive.   In ED given protonix infusion, octreotide, reglan, fentanyl 50 x 2, ctx.  +1L  3 unit blood given, 4th stopped due to possible local reaction.  Patient became more altered, more lethargic, hypotensive.  Responded to rapid infusion of blood (4 u and ffp).     Multiple large episodes bright red emesis, large volume, also large volume melena.   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 -GI input greatly appreciated, continue octreotide x 5 days, continue with IV Rocephin, continue Protonix drip for total 72 hours then changed to 40 mg p.o. twice daily. - S/P BRTO 12/14 afternoon, management per IR, plan to repeat CTA in 48 hours per IR -Will need EGD in 4 to 6 weeks -Will need Coreg upon discharge if blood pressure allows -Continue to monitor hemoglobin and transfuse as needed, -Received 3 units so far of PRBCs -On full liquid  diet today, if hemoglobin this afternoon is stable will advance to soft diet -Some lower extremity edema, but blood pressure is soft, he is on Coreg as well given known varices and significant tachycardia, no room and blood pressure to add Aldactone or Lasix currently. -INR is 1.6, started on oral vitamin K -Does report dark BM overnight, hemoglobin slowly trended down to 7.2 today, will recheck CBC this afternoon  Hypokalemia -Repleted   Urinary retention/BPH -Need to monitor bladder scan closely, encouraged to ambulate, recent imaging significant for mild enlarged prostate, started on Flomax.     DVT prophylaxis: SCD, he is ambulating Code Status: Full Family Communication: None at bedside Disposition:   Status is: Inpatient    Consultants:  PCCM IR GI  Subjective:  Ports bowel movement yesterday which was dark in color, but no blood or clots in it. Objective: Vitals:   07/06/22 1956 07/06/22 2329 07/07/22 0422 07/07/22 0849  BP: 109/62 108/63 (!) 118/59 99/68  Pulse: 97 97 87 88  Resp: '18 18 20 19  '$ Temp: 99.2 F (37.3 C) 99.5 F (37.5 C) 99 F (37.2 C) 98.6 F (37 C)  TempSrc: Oral Oral Oral Oral  SpO2: 96% 94% 93% 96%  Weight:      Height:        Intake/Output Summary (Last 24 hours) at 07/07/2022 1138 Last data filed at 07/07/2022 1000 Gross per 24 hour  Intake --  Output 810 ml  Net -810 ml   [employer]  07/04/22 0500 07/05/22 0400 07/06/22 0500  Weight: 84.8 kg 86.1 kg 93.7 kg    Examination:  Awake Alert, Oriented X 3, No new F.N deficits, Normal affect Symmetrical Chest wall movement, Good air movement bilaterally, CTAB RRR,No Gallops,Rubs or new Murmurs, No Parasternal Heave +ve B.Sounds, Abd Soft, No tenderness, No rebound - guarding or rigidity. No Cyanosis, Clubbing ,+1  edema, No new Rash or bruise         Data Reviewed: I have personally reviewed following labs and imaging studies  CBC: Recent Labs  Lab 07/02/22 2300  07/02/22 2310 07/03/22 0331 07/03/22 1037 07/04/22 2306 07/05/22 0423 07/05/22 1444 07/05/22 1928 07/06/22 0205 07/07/22 0157  WBC 25.3*  --  18.7*   < > 21.7* 13.4* 9.1  --  9.5 10.2  NEUTROABS 21.8*  --  16.8*  --   --   --   --   --   --   --   HGB 5.9*   < > 9.7*   < > 8.8* 7.5* 6.8* 7.3* 7.8* 7.2*  HCT 17.7*   < > 27.4*   < > 25.0* 22.8* 20.4* 21.4* 23.5* 21.4*  MCV 106.6*  --  91.9   < > 92.6 97.9 99.5  --  97.1 97.7  PLT 147*  --  53*   < > 98* 66* 47*  --  56* 66*   < > = values in this interval not displayed.    Basic Metabolic Panel: Recent Labs  Lab 07/02/22 2300 07/02/22 2310 07/04/22 0420 07/05/22 0423 07/05/22 0424 07/06/22 0205 07/07/22 0157  NA 140 142 140 138  --  137 133*  K 4.1 4.1 3.8 3.6  --  3.4* 3.4*  CL 112* 110 114* 109  --  108 105  CO2 15*  --  21* 22  --  22 22  GLUCOSE 175* 166* 129* 118*  --  106* 96  BUN 23* 25* 22* 17  --  15 14  CREATININE 0.78 0.60* 0.79 0.83  --  0.70 0.68  CALCIUM 7.8*  --  7.2* 7.7*  --  7.9* 7.6*  PHOS  --   --   --   --  2.9  --   --     GFR: Estimated Creatinine Clearance: 124.8 mL/min (by C-G formula based on SCr of 0.68 mg/dL).  Liver Function Tests: Recent Labs  Lab 07/02/22 2300 07/04/22 0420 07/05/22 0423 07/06/22 0205 07/07/22 0157  AST 50* 50* 41 50* 50*  ALT '17 19 18 19 20  '$ ALKPHOS 57 37* 34* 36* 51  BILITOT 2.4* 3.2* 3.1* 4.8* 3.3*  PROT 5.1* 4.5* 4.8* 5.0* 4.9*  ALBUMIN 2.1* 2.0* 2.6* 2.5* 2.4*    CBG: Recent Labs  Lab 07/03/22 0513 07/03/22 1147 07/03/22 1641 07/04/22 0347  GLUCAP 150* 122* 120* 101*     Recent Results (from the past 240 hour(s))  Culture, blood (Routine X 2) w Reflex to ID Panel     Status: None (Preliminary result)   Collection Time: 07/03/22  3:45 AM   Specimen: BLOOD  Result Value Ref Range Status   Specimen Description BLOOD SITE NOT SPECIFIED  Final   Special Requests   Final    BOTTLES DRAWN AEROBIC AND ANAEROBIC Blood Culture adequate volume    Culture   Final    NO GROWTH 4 DAYS Performed at [facility] [address], [place], [place] [address]    Report Status PENDING  Incomplete  Culture, blood (  Routine X 2) w Reflex to ID Panel     Status: None (Preliminary result)   Collection Time: 07/03/22  6:19 AM   Specimen: BLOOD RIGHT FOREARM  Result Value Ref Range Status   Specimen Description BLOOD RIGHT FOREARM  Final   Special Requests   Final    BOTTLES DRAWN AEROBIC AND ANAEROBIC Blood Culture adequate volume   Culture   Final    NO GROWTH 4 DAYS Performed at [facility], [address], [place], [place] [address]    Report Status PENDING  Incomplete         Radiology Studies: CT Angio Abd/Pel w/ and/or w/o  Result Date: 07/05/2022 CLINICAL DATA:  51 year old male with history of portal hypertension hematemesis status post transvenous obliteration of gastroesophageal varices on 07/04/2022. EXAM: CTA ABDOMEN AND PELVIS WITHOUT AND WITH CONTRAST TECHNIQUE: Multidetector CT imaging of the abdomen and pelvis was performed using the standard protocol during bolus administration of intravenous contrast. Multiplanar reconstructed images and MIPs were obtained and reviewed to evaluate the vascular anatomy. RADIATION DOSE REDUCTION: This exam was performed according to the departmental dose-optimization program which includes automated exposure control, adjustment of the mA and/or kV according to patient size and/or use of iterative reconstruction technique. CONTRAST:  43m OMNIPAQUE IOHEXOL 350 MG/ML SOLN COMPARISON:  07/04/2022, 07/03/2022 FINDINGS: VASCULAR Aorta: Normal caliber aorta without aneurysm, dissection, vasculitis or significant stenosis. Mild scattered infrarenal atherosclerotic calcification. Celiac: Patent without evidence of aneurysm, dissection, vasculitis or significant stenosis. SMA: Patent without evidence of aneurysm, dissection, vasculitis or significant stenosis. Renals: Both renal arteries are  patent without evidence of aneurysm, dissection, vasculitis, fibromuscular dysplasia or significant stenosis. IMA: Patent without evidence of aneurysm, dissection, vasculitis or significant stenosis. Inflow: Patent without evidence of aneurysm, dissection, vasculitis or significant stenosis. Proximal Outflow: Bilateral common femoral and visualized portions of the superficial and profunda femoral arteries are patent without evidence of aneurysm, dissection, vasculitis or significant stenosis. Veins: The hepatic veins are poorly opacified, however appear patent. The intrahepatic portal veins, main vein, superior mesenteric vein, and inferior mesenteric vein are widely patent and normal in caliber. Limited visualization of the splenic vein due to streak artifact. Status post coil assisted transvenous obliteration via left gastro renal shunt with radiopaque left fide all material within esophageal and gastric varices. Large coil pack about the inferior aspect of the gastro renal shunt. The left gastric vein is patent. Previously visualized posterior gastric vein is difficult to visualize due to streak artifact. The renal veins are patent bilaterally with retroaortic left renal vein. There is similar appearing compression of left renal vein by the overlying aorta. No evidence of iliocaval thrombosis or anomaly. Similar appearing moderate compression of left common iliac vein the overlying right common iliac artery. Review of the MIP images confirms the above findings. NON-VASCULAR Lower chest: Bibasilar subsegmental atelectasis. Hepatobiliary: Mildly shrunken right lobe of the liver with left lobe hypertrophy. Diffuse macronodular contour. Scattered well-defined water density hypoattenuating rounded lesions throughout the hepatic parenchyma, unchanged. The gallbladder is present and unremarkable. No intra or extrahepatic biliary ductal dilation. Pancreas: Unremarkable. No pancreatic ductal dilatation or surrounding  inflammatory changes. Spleen: Normal in size without focal abnormality. Adrenals/Urinary Tract: Adrenal glands are unremarkable. Unchanged scattered simple renal cysts. Kidneys are otherwise normal, without renal calculi, focal lesion, or hydronephrosis. Bladder is unremarkable. Stomach/Bowel: Interval transvenous obliteration of previously visualized large gastric varices with intraluminal lipiodol visualized in the medial gastric cardia. The remaining stomach is within normal limits. No evidence of  significant bowel distension or wall thickening. Lymphatic: No abdominopelvic lymphadenopathy. Reproductive: Similar mild prostatomegaly. Other: Similar trace ascites. Musculoskeletal: No acute or significant osseous findings. IMPRESSION: 1. Postprocedural changes status post coil assisted transvenous obliteration of gastroesophageal varices, no complicating features. 2. Similar appearing cirrhotic stigmata and trace ascites. Ruthann Cancer, MD [facility] Radiology Electronically Signed   By: Ruthann Cancer M.D.   On: 07/05/2022 12:37        Scheduled Meds:  carvedilol  3.125 mg Oral BID WC   Chlorhexidine Gluconate Cloth  6 each Topical Daily   feeding supplement  237 mL Oral TID BM   multivitamin with minerals  1 tablet Oral Daily   pantoprazole  40 mg Intravenous Q12H   potassium chloride  40 mEq Oral Q6H   tamsulosin  0.4 mg Oral QPC supper   thiamine  100 mg Oral Daily   Continuous Infusions:  sodium chloride Stopped (07/05/22 1252)   cefTRIAXone (ROCEPHIN)  IV 1 g (07/06/22 2110)   fentaNYL infusion INTRAVENOUS Stopped (07/05/22 0747)   octreotide (SANDOSTATIN) 500 mcg in sodium chloride 0.9 % 250 mL (2 mcg/mL) infusion 50 mcg/hr (07/07/22 0545)     LOS: 4 days       Phillips Climes, MD [facility]   To contact the attending provider between 7A-7P or the covering provider during after hours 7P-7A, please log into the web site  [email or web address] and access using universal [place] password for that web site. If you do not have the password, please call the hospital operator.  07/07/2022, 11:38 AM

## 2022-07-08 ENCOUNTER — Other Ambulatory Visit (HOSPITAL_COMMUNITY): Payer: Self-pay

## 2022-07-08 DIAGNOSIS — K7030 Alcoholic cirrhosis of liver without ascites: Principal | ICD-10-CM

## 2022-07-08 LAB — COMPREHENSIVE METABOLIC PANEL
ALT: 21 U/L (ref 0–44)
AST: 53 U/L — ABNORMAL HIGH (ref 15–41)
Albumin: 2.2 g/dL — ABNORMAL LOW (ref 3.5–5.0)
Alkaline Phosphatase: 44 U/L (ref 38–126)
Anion gap: 9 (ref 5–15)
BUN: 13 mg/dL (ref 6–20)
CO2: 21 mmol/L — ABNORMAL LOW (ref 22–32)
Calcium: 7.5 mg/dL — ABNORMAL LOW (ref 8.9–10.3)
Chloride: 100 mmol/L (ref 98–111)
Creatinine, Ser: 0.73 mg/dL (ref 0.61–1.24)
GFR, Estimated: >60 mL/min (ref >60)
Glucose, Bld: 96 mg/dL (ref 70–99)
Potassium: 3.9 mmol/L (ref 3.5–5.1)
Sodium: 130 mmol/L — ABNORMAL LOW (ref 135–145)
Total Bilirubin: 3.3 mg/dL — ABNORMAL HIGH (ref 0.3–1.2)
Total Protein: 4.7 g/dL — ABNORMAL LOW (ref 6.5–8.1)

## 2022-07-08 LAB — PROTIME-INR
INR: 1.7 — ABNORMAL HIGH (ref 0.8–1.2)
Prothrombin Time: 20 seconds — ABNORMAL HIGH (ref 11.4–15.2)

## 2022-07-08 LAB — CBC
HCT: 21.4 % — ABNORMAL LOW (ref 39.0–52.0)
Hemoglobin: 7 g/dL — ABNORMAL LOW (ref 13.0–17.0)
MCH: 32.3 pg (ref 26.0–34.0)
MCHC: 32.7 g/dL (ref 30.0–36.0)
MCV: 98.6 fL (ref 80.0–100.0)
Platelets: 88 10*3/uL — ABNORMAL LOW (ref 150–400)
RBC: 2.17 MIL/uL — ABNORMAL LOW (ref 4.22–5.81)
RDW: 19.3 % — ABNORMAL HIGH (ref 11.5–15.5)
WBC: 9.2 10*3/uL (ref 4.0–10.5)
nRBC: 0 % (ref 0.0–0.2)

## 2022-07-08 LAB — CULTURE, BLOOD (ROUTINE X 2)
Culture: NO GROWTH
Culture: NO GROWTH
Special Requests: ADEQUATE
Special Requests: ADEQUATE

## 2022-07-08 LAB — HEMOGLOBIN AND HEMATOCRIT, BLOOD
HCT: 26.9 % — ABNORMAL LOW (ref 39.0–52.0)
Hemoglobin: 9 g/dL — ABNORMAL LOW (ref 13.0–17.0)

## 2022-07-08 LAB — PREPARE RBC (CROSSMATCH)

## 2022-07-08 MED ORDER — SODIUM CHLORIDE 0.9% IV SOLUTION: Freq: Once | INTRAVENOUS | Status: AC

## 2022-07-08 MED ORDER — CARVEDILOL 3.125 MG PO TABS
3.1250 mg | ORAL_TABLET | Freq: Two times a day (BID) | ORAL | 0 refills | Status: DC
  Filled 2022-07-08: qty 60, 30d supply, fill #0

## 2022-07-08 MED ORDER — PANTOPRAZOLE SODIUM 40 MG PO TBEC
40.0000 mg | DELAYED_RELEASE_TABLET | Freq: Two times a day (BID) | ORAL | 0 refills | Status: DC
  Filled 2022-07-08: qty 60, 30d supply, fill #0

## 2022-07-08 MED ORDER — TAMSULOSIN HCL 0.4 MG PO CAPS
0.4000 mg | ORAL_CAPSULE | Freq: Every day | ORAL | 0 refills | Status: DC
  Filled 2022-07-08: qty 30, 30d supply, fill #0

## 2022-07-08 NOTE — Discharge Instructions (Signed)
Follow with Primary MD Mack Hook, MD in 7 days   Get CBC, CMP, checked  by Primary MD next visit.    Activity: As tolerated with Full fall precautions use walker/cane & assistance as needed   Disposition Home    Diet: Heart Healthy /low salt   On your next visit with your primary care physician please Get Medicines reviewed and adjusted.   Please request your Prim.MD to go over all Hospital Tests and Procedure/Radiological results at the follow up, please get all Hospital records sent to your Prim MD by signing hospital release before you go home.   If you experience worsening of your admission symptoms, develop shortness of breath, life threatening emergency, suicidal or homicidal thoughts you must seek medical attention immediately by calling 911 or calling your MD immediately  if symptoms less severe.  You Must read complete instructions/literature along with all the possible adverse reactions/side effects for all the Medicines you take and that have been prescribed to you. Take any new Medicines after you have completely understood and accpet all the possible adverse reactions/side effects.   Do not drive, operating heavy machinery, perform activities at heights, swimming or participation in water activities or provide baby sitting services if your were admitted for syncope or siezures until you have seen by Primary MD or a Neurologist and advised to do so again.  Do not drive when taking Pain medications.    Do not take more than prescribed Pain, Sleep and Anxiety Medications  Special Instructions: If you have smoked or chewed Tobacco  in the last 2 yrs please stop smoking, stop any regular Alcohol  and or any Recreational drug use.  Wear Seat belts while driving.   Please note  You were cared for by a hospitalist during your hospital stay. If you have any questions about your discharge medications or the care you received while you were in the hospital after you  are discharged, you can call the unit and asked to speak with the hospitalist on call if the hospitalist that took care of you is not available. Once you are discharged, your primary care physician will handle any further medical issues. Please note that NO REFILLS for any discharge medications will be authorized once you are discharged, as it is imperative that you return to your primary care physician (or establish a relationship with a primary care physician if you do not have one) for your aftercare needs so that they can reassess your need for medications and monitor your lab values.

## 2022-07-08 NOTE — Discharge Summary (Signed)
Physician Discharge Summary  Stephen Miranda SEG:[id number] DOB: 09/28/1970 DOA: 07/02/2022  PCP: Mack Hook, MD  Admit date: 07/02/2022 Discharge date: 07/08/2022  Admitted From: (Home) Disposition:  (Home )  Recommendations for Outpatient Follow-up:  Follow up with PCP in 1-2 weeks Please obtain BMP/CBC in one week Patient to follow with GI, appointment scheduled for 07/24/2022   Discharge Condition: (Stable) CODE STATUS: (FULL) Diet recommendation: low sodium  Brief/Interim Summary:  Stephen Miranda is a 51 yo man with hx of etoh use (sober 4 years), hx of gastric duodenal and esophageal varices here  with hematemesis.  Began at noon.  Dizzy and weak.  EMS reported an estimated 1L blood on floor.   Hx of melena.   Initially tachycardic and hypotensive.   In ED given protonix infusion, octreotide, reglan, fentanyl 50 x 2, ctx.  +1L  3 unit blood given, 4th stopped due to possible local reaction.  Patient became more altered, more lethargic, hypotensive.  Responded to rapid infusion of blood (4 u and ffp).     Multiple large episodes bright red emesis, large volume, also large volume melena.     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 -GI input greatly appreciated, continue octreotide x 5 days, continue with IV Rocephin, continue Protonix drip for total 72 hours then changed to 40 mg p.o. twice daily. - S/P BRTO 12/14 afternoon, management per IR, plan to repeat CTA in 48 hours per IR -Will need EGD in 4 to 6 weeks -Darted on Coreg prior to discharge -Continue to monitor hemoglobin and transfuse as needed, -Received 4 units so far of  PRBCs, most recent received 1 unit today, with hemoglobin of 7 earlier today, it is 9 at time of discharge, patient with BM, normal in color today. -Some lower extremity edema, but blood pressure is soft, he is on Coreg as well given known varices and significant tachycardia, no room and blood pressure to add Aldactone or Lasix currently. -Received vitamin K for coagulopathy   Hypokalemia -Repleted     Urinary retention/BPH -Need to monitor bladder scan closely, encouraged to ambulate, recent imaging significant for mild enlarged prostate, started on Flomax.    Discharge Diagnoses:  Principal Problem:   Hematemesis Active Problems:   Alcoholic cirrhosis (HCC)   Portal hypertension (HCC)   Bleeding gastric varices   Esophageal and gastric varices (HCC)   Shock (HCC)   Pressure injury of skin   Malnutrition of moderate degree    Discharge Instructions  Discharge Instructions     Diet - low sodium heart healthy   Complete by: As directed    Discharge instructions   Complete by: As directed    Follow with Primary MD Mack Hook, MD in 7 days   Get CBC, CMP, checked  by Primary MD next visit.    Activity: As tolerated with Full fall precautions use walker/cane & assistance as needed   Disposition Home    Diet: Heart Healthy /low salt   On your next visit with your primary care physician please Get Medicines reviewed and adjusted.   Please request your Prim.MD to go over all Hospital Tests and Procedure/Radiological results at the follow up, please get all Hospital records sent  to your Prim MD by signing hospital release before you go home.   If you experience worsening of your admission symptoms, develop shortness of breath, life threatening emergency, suicidal or homicidal thoughts you must seek medical attention immediately by calling 911 or calling your MD immediately  if symptoms less severe.  You Must read complete instructions/literature along with all  the possible adverse reactions/side effects for all the Medicines you take and that have been prescribed to you. Take any new Medicines after you have completely understood and accpet all the possible adverse reactions/side effects.   Do not drive, operating heavy machinery, perform activities at heights, swimming or participation in water activities or provide baby sitting services if your were admitted for syncope or siezures until you have seen by Primary MD or a Neurologist and advised to do so again.  Do not drive when taking Pain medications.    Do not take more than prescribed Pain, Sleep and Anxiety Medications  Special Instructions: If you have smoked or chewed Tobacco  in the last 2 yrs please stop smoking, stop any regular Alcohol  and or any Recreational drug use.  Wear Seat belts while driving.   Please note  You were cared for by a hospitalist during your hospital stay. If you have any questions about your discharge medications or the care you received while you were in the hospital after you are discharged, you can call the unit and asked to speak with the hospitalist on call if the hospitalist that took care of you is not available. Once you are discharged, your primary care physician will handle any further medical issues. Please note that NO REFILLS for any discharge medications will be authorized once you are discharged, as it is imperative that you return to your primary care physician (or establish a relationship with a primary care physician if you do not have one) for your aftercare needs so that they can reassess your need for medications and monitor your lab values.   Increase activity slowly   Complete by: As directed    No wound care   Complete by: As directed       Allergies as of 07/08/2022   No Known Allergies      Medication List     STOP taking these medications    furosemide 20 MG tablet Commonly known as: LASIX   gabapentin 100 MG  capsule Commonly known as: NEURONTIN       TAKE these medications    carvedilol 3.125 MG tablet Commonly known as: COREG Take 1 tablet (3.125 mg total) by mouth 2 (two) times daily with a meal.   multivitamin with minerals Tabs tablet Take 1 tablet by mouth daily.   pantoprazole 40 MG tablet Commonly known as: Protonix Take 1 tablet (40 mg total) by mouth 2 (two) times daily.   tamsulosin 0.4 MG Caps capsule Commonly known as: FLOMAX Take 1 capsule (0.4 mg total) by mouth daily after supper.        Follow-up Information     Greggory Keen, MD Follow up.   Specialties: Interventional Radiology, Radiology Why: IR scheduler will call you to schedule your repeat CT scan and follow up appointment with Dr. Annamaria Boots in about 3 months. If you do not hear from someone or have questions/concerns please call [phone] or [phone] Contact information: [address] [place]  [address] [phone]         Willia Craze, NP Follow up on  07/24/2022.   Specialty: Gastroenterology Why: The pt has been scheduled to see Tye Savoy on 07/24/22 at 130 pm.  He will be given appt information at discharge Contact information: [place] [address] [phone]                No Known Allergies  Consultations: IR GI PCCM   Procedures/Studies: CT Angio Abd/Pel w/ and/or w/o  Result Date: 07/05/2022 CLINICAL DATA:  51 year old male with history of portal hypertension hematemesis status post transvenous obliteration of gastroesophageal varices on 07/04/2022. EXAM: CTA ABDOMEN AND PELVIS WITHOUT AND WITH CONTRAST TECHNIQUE: Multidetector CT imaging of the abdomen and pelvis was performed using the standard protocol during bolus administration of intravenous contrast. Multiplanar reconstructed images and MIPs were obtained and reviewed to evaluate the vascular anatomy. RADIATION DOSE REDUCTION: This exam was performed according to the  departmental dose-optimization program which includes automated exposure control, adjustment of the mA and/or kV according to patient size and/or use of iterative reconstruction technique. CONTRAST:  22m OMNIPAQUE IOHEXOL 350 MG/ML SOLN COMPARISON:  07/04/2022, 07/03/2022 FINDINGS: VASCULAR Aorta: Normal caliber aorta without aneurysm, dissection, vasculitis or significant stenosis. Mild scattered infrarenal atherosclerotic calcification. Celiac: Patent without evidence of aneurysm, dissection, vasculitis or significant stenosis. SMA: Patent without evidence of aneurysm, dissection, vasculitis or significant stenosis. Renals: Both renal arteries are patent without evidence of aneurysm, dissection, vasculitis, fibromuscular dysplasia or significant stenosis. IMA: Patent without evidence of aneurysm, dissection, vasculitis or significant stenosis. Inflow: Patent without evidence of aneurysm, dissection, vasculitis or significant stenosis. Proximal Outflow: Bilateral common femoral and visualized portions of the superficial and profunda femoral arteries are patent without evidence of aneurysm, dissection, vasculitis or significant stenosis. Veins: The hepatic veins are poorly opacified, however appear patent. The intrahepatic portal veins, main vein, superior mesenteric vein, and inferior mesenteric vein are widely patent and normal in caliber. Limited visualization of the splenic vein due to streak artifact. Status post coil assisted transvenous obliteration via left gastro renal shunt with radiopaque left fide all material within esophageal and gastric varices. Large coil pack about the inferior aspect of the gastro renal shunt. The left gastric vein is patent. Previously visualized posterior gastric vein is difficult to visualize due to streak artifact. The renal veins are patent bilaterally with retroaortic left renal vein. There is similar appearing compression of left renal vein by the overlying aorta. No  evidence of iliocaval thrombosis or anomaly. Similar appearing moderate compression of left common iliac vein the overlying right common iliac artery. Review of the MIP images confirms the above findings. NON-VASCULAR Lower chest: Bibasilar subsegmental atelectasis. Hepatobiliary: Mildly shrunken right lobe of the liver with left lobe hypertrophy. Diffuse macronodular contour. Scattered well-defined water density hypoattenuating rounded lesions throughout the hepatic parenchyma, unchanged. The gallbladder is present and unremarkable. No intra or extrahepatic biliary ductal dilation. Pancreas: Unremarkable. No pancreatic ductal dilatation or surrounding inflammatory changes. Spleen: Normal in size without focal abnormality. Adrenals/Urinary Tract: Adrenal glands are unremarkable. Unchanged scattered simple renal cysts. Kidneys are otherwise normal, without renal calculi, focal lesion, or hydronephrosis. Bladder is unremarkable. Stomach/Bowel: Interval transvenous obliteration of previously visualized large gastric varices with intraluminal lipiodol visualized in the medial gastric cardia. The remaining stomach is within normal limits. No evidence of significant bowel distension or wall thickening. Lymphatic: No abdominopelvic lymphadenopathy. Reproductive: Similar mild prostatomegaly. Other: Similar trace ascites. Musculoskeletal: No acute or significant osseous findings. IMPRESSION: 1. Postprocedural changes status post coil assisted transvenous obliteration of gastroesophageal varices, no complicating features. 2. Similar  appearing cirrhotic stigmata and trace ascites. Ruthann Cancer, MD [facility] Radiology Electronically Signed   By: Ruthann Cancer M.D.   On: 07/05/2022 12:37   IR Venogram Renal Uni Left  Result Date: 07/04/2022 CLINICAL DATA:  LARGE BLEEDING GASTRIC VARICES RESULTING IN ACUTE BLOOD LOSS ANEMIA EXAM: ULTRASOUND GUIDANCE FOR VASCULAR ACCESS LEFT  RENAL CATHETERIZATION AND VENOGRAM LEFT GASTRO RENAL SHUNT CATHETERIZATION, VENOGRAM BALLOON OCCLUDED RETROGRADE TRANSVENOUS OBLITERATION (BRTO OF THE GASTRIC VARICES MEDICATIONS: As antibiotic prophylaxis, 2 G ANCEF was ordered pre-procedure and administered intravenously within one hour of incision. ANESTHESIA/SEDATION: General - as administered by the Anesthesia department CONTRAST:  65 cc Isovue 300 FLUOROSCOPY: Radiation Exposure Index (as provided by the fluoroscopic device): 161 mGy Kerma COMPLICATIONS: None immediate. PROCEDURE: Informed written consent was obtained from the patient's mother Gwenette Greet Mersereau) after a thorough discussion of the procedural risks, benefits and alternatives. All questions were addressed. Maximal Sterile Barrier Technique was utilized including caps, mask, sterile gowns, sterile gloves, sterile drape, hand hygiene and skin antiseptic. A timeout was performed prior to the initiation of the procedure. Previous imaging reviewed. Under sterile conditions and general anesthesia, right common femoral vein micropuncture access performed with ultrasound. Images obtained for documentation of the patent right common femoral vein. Guidewire inserted easily followed by the micro dilator set. Tract dilatation performed to insert the 35 cm 10 French braided TIPS sheath into the IVC. Five [place] Kumpe catheter advanced into the left renal vein. Initial left renal venogram performed. Left renal vein is widely patent. Large gastro renal shunt noted superiorly correlating with the CT findings. Kumpe catheter and Glidewire utilized to access the gastro renal shunt. Five French catheter advanced into the gastro renal shunt over a Glidewire. Initial shunt venogram performed. Gastro renal shunt is incompletely opacified but widely patent. Amplatz guidewire inserted. Ten French sheath advanced into the orifice of the gastro renal shunt. 11.5 mm balloon occlusion catheter advanced over the Amplatz  guidewire into the gastro renal shunt past the level of the outflow valve. 11.5 mm balloon was inflated for balloon occlusion. Retrograde CO2 balloon occluded venogram performed. CO2 venogram: This confirms the large gastric varices with retrograde communication visualized via the posterior gastric to the splenic vein and the left gastric vein to the portal mesenteric confluence. Mesenteric and portal veins are also patent and normal in caliber. The double marker lantern microcatheter was advanced through the balloon occlusion catheter into the gastric varix. Contrast injection confirms position. Balloon occluded retrograde transvenous obliteration performed of the gastric varices by slowly instilling approximally 40 cc total volume of foam sclerotherapy (mixture of 6 cc air, 4 cc 3% ST S, and 2 cc Lipiodol). Reflux was visualized within the posterior gastric vein communicating with the splenic vein. At this point, no further sclerotherapy injected. Lantern catheter was removed and flushed. This was replaced within the gastro renal shunt above the balloon occlusion catheter. Coil embolization performed by deploying a total of 14 Ruby coils ranging in size from 28-40 mm and 60 cm length. Several additional packing coils were also deployed for the coil pack. Following this, the balloon was left inflated for 10 minutes. Under fluoroscopy, the balloon was slowly deflated without any coil pack migration. Repeat venogram confirms occlusion of the gastro renal shunt and the gastric varices. Orifice of the gastro renal shunt into the renal vein remains patent. Balloon occlusion catheter removed. Final venogram performed confirming preserved patency of the left renal vein. Access removed. Hemostasis obtained with manual compression. Patient  tolerated the procedure well. IMPRESSION: Successful balloon occluded retrograde transvenous obliteration (BRTO) of the gastric varices as detailed above. Follow-up CTA (BRTO protocol)  will be performed 48 hours to assess for complete GV occlusion. Electronically Signed   By: Jerilynn Mages.  Shick M.D.   On: 07/04/2022 17:34   IR US Guide Vasc Access Right  Result Date: 07/04/2022 CLINICAL DATA:  LARGE BLEEDING GASTRIC VARICES RESULTING IN ACUTE BLOOD LOSS ANEMIA EXAM: ULTRASOUND GUIDANCE FOR VASCULAR ACCESS LEFT RENAL CATHETERIZATION AND VENOGRAM LEFT GASTRO RENAL SHUNT CATHETERIZATION, VENOGRAM BALLOON OCCLUDED RETROGRADE TRANSVENOUS OBLITERATION (BRTO OF THE GASTRIC VARICES MEDICATIONS: As antibiotic prophylaxis, 2 G ANCEF was ordered pre-procedure and administered intravenously within one hour of incision. ANESTHESIA/SEDATION: General - as administered by the Anesthesia department CONTRAST:  65 cc Isovue 300 FLUOROSCOPY: Radiation Exposure Index (as provided by the fluoroscopic device): 277 mGy Kerma COMPLICATIONS: None immediate. PROCEDURE: Informed written consent was obtained from the patient's mother Gwenette Greet Giambalvo) after a thorough discussion of the procedural risks, benefits and alternatives. All questions were addressed. Maximal Sterile Barrier Technique was utilized including caps, mask, sterile gowns, sterile gloves, sterile drape, hand hygiene and skin antiseptic. A timeout was performed prior to the initiation of the procedure. Previous imaging reviewed. Under sterile conditions and general anesthesia, right common femoral vein micropuncture access performed with ultrasound. Images obtained for documentation of the patent right common femoral vein. Guidewire inserted easily followed by the micro dilator set. Tract dilatation performed to insert the 35 cm 10 French braided TIPS sheath into the IVC. Five [place] Kumpe catheter advanced into the left renal vein. Initial left renal venogram performed. Left renal vein is widely patent. Large gastro renal shunt noted superiorly correlating with the CT findings. Kumpe catheter and Glidewire utilized to access the gastro renal shunt. Five French  catheter advanced into the gastro renal shunt over a Glidewire. Initial shunt venogram performed. Gastro renal shunt is incompletely opacified but widely patent. Amplatz guidewire inserted. Ten French sheath advanced into the orifice of the gastro renal shunt. 11.5 mm balloon occlusion catheter advanced over the Amplatz guidewire into the gastro renal shunt past the level of the outflow valve. 11.5 mm balloon was inflated for balloon occlusion. Retrograde CO2 balloon occluded venogram performed. CO2 venogram: This confirms the large gastric varices with retrograde communication visualized via the posterior gastric to the splenic vein and the left gastric vein to the portal mesenteric confluence. Mesenteric and portal veins are also patent and normal in caliber. The double marker lantern microcatheter was advanced through the balloon occlusion catheter into the gastric varix. Contrast injection confirms position. Balloon occluded retrograde transvenous obliteration performed of the gastric varices by slowly instilling approximally 40 cc total volume of foam sclerotherapy (mixture of 6 cc air, 4 cc 3% ST S, and 2 cc Lipiodol). Reflux was visualized within the posterior gastric vein communicating with the splenic vein. At this point, no further sclerotherapy injected. Lantern catheter was removed and flushed. This was replaced within the gastro renal shunt above the balloon occlusion catheter. Coil embolization performed by deploying a total of 14 Ruby coils ranging in size from 28-40 mm and 60 cm length. Several additional packing coils were also deployed for the coil pack. Following this, the balloon was left inflated for 10 minutes. Under fluoroscopy, the balloon was slowly deflated without any coil pack migration. Repeat venogram confirms occlusion of the gastro renal shunt and the gastric varices. Orifice of the gastro renal shunt into the renal vein remains patent. Balloon occlusion catheter removed.  Final  venogram performed confirming preserved patency of the left renal vein. Access removed. Hemostasis obtained with manual compression. Patient tolerated the procedure well. IMPRESSION: Successful balloon occluded retrograde transvenous obliteration (BRTO) of the gastric varices as detailed above. Follow-up CTA (BRTO protocol) will be performed 48 hours to assess for complete GV occlusion. Electronically Signed   By: Jerilynn Mages.  Shick M.D.   On: 07/04/2022 17:34   IR Angiogram Selective Each Additional Vessel  Result Date: 07/04/2022 CLINICAL DATA:  LARGE BLEEDING GASTRIC VARICES RESULTING IN ACUTE BLOOD LOSS ANEMIA EXAM: ULTRASOUND GUIDANCE FOR VASCULAR ACCESS LEFT RENAL CATHETERIZATION AND VENOGRAM LEFT GASTRO RENAL SHUNT CATHETERIZATION, VENOGRAM BALLOON OCCLUDED RETROGRADE TRANSVENOUS OBLITERATION (BRTO OF THE GASTRIC VARICES MEDICATIONS: As antibiotic prophylaxis, 2 G ANCEF was ordered pre-procedure and administered intravenously within one hour of incision. ANESTHESIA/SEDATION: General - as administered by the Anesthesia department CONTRAST:  65 cc Isovue 300 FLUOROSCOPY: Radiation Exposure Index (as provided by the fluoroscopic device): 401 mGy Kerma COMPLICATIONS: None immediate. PROCEDURE: Informed written consent was obtained from the patient's mother Gwenette Greet Berrones) after a thorough discussion of the procedural risks, benefits and alternatives. All questions were addressed. Maximal Sterile Barrier Technique was utilized including caps, mask, sterile gowns, sterile gloves, sterile drape, hand hygiene and skin antiseptic. A timeout was performed prior to the initiation of the procedure. Previous imaging reviewed. Under sterile conditions and general anesthesia, right common femoral vein micropuncture access performed with ultrasound. Images obtained for documentation of the patent right common femoral vein. Guidewire inserted easily followed by the micro dilator set. Tract dilatation performed to insert the 35 cm  10 French braided TIPS sheath into the IVC. Five [place] Kumpe catheter advanced into the left renal vein. Initial left renal venogram performed. Left renal vein is widely patent. Large gastro renal shunt noted superiorly correlating with the CT findings. Kumpe catheter and Glidewire utilized to access the gastro renal shunt. Five French catheter advanced into the gastro renal shunt over a Glidewire. Initial shunt venogram performed. Gastro renal shunt is incompletely opacified but widely patent. Amplatz guidewire inserted. Ten French sheath advanced into the orifice of the gastro renal shunt. 11.5 mm balloon occlusion catheter advanced over the Amplatz guidewire into the gastro renal shunt past the level of the outflow valve. 11.5 mm balloon was inflated for balloon occlusion. Retrograde CO2 balloon occluded venogram performed. CO2 venogram: This confirms the large gastric varices with retrograde communication visualized via the posterior gastric to the splenic vein and the left gastric vein to the portal mesenteric confluence. Mesenteric and portal veins are also patent and normal in caliber. The double marker lantern microcatheter was advanced through the balloon occlusion catheter into the gastric varix. Contrast injection confirms position. Balloon occluded retrograde transvenous obliteration performed of the gastric varices by slowly instilling approximally 40 cc total volume of foam sclerotherapy (mixture of 6 cc air, 4 cc 3% ST S, and 2 cc Lipiodol). Reflux was visualized within the posterior gastric vein communicating with the splenic vein. At this point, no further sclerotherapy injected. Lantern catheter was removed and flushed. This was replaced within the gastro renal shunt above the balloon occlusion catheter. Coil embolization performed by deploying a total of 14 Ruby coils ranging in size from 28-40 mm and 60 cm length. Several additional packing coils were also deployed for the coil pack. Following  this, the balloon was left inflated for 10 minutes. Under fluoroscopy, the balloon was slowly deflated without any coil pack migration. Repeat venogram confirms occlusion of the gastro renal shunt  and the gastric varices. Orifice of the gastro renal shunt into the renal vein remains patent. Balloon occlusion catheter removed. Final venogram performed confirming preserved patency of the left renal vein. Access removed. Hemostasis obtained with manual compression. Patient tolerated the procedure well. IMPRESSION: Successful balloon occluded retrograde transvenous obliteration (BRTO) of the gastric varices as detailed above. Follow-up CTA (BRTO protocol) will be performed 48 hours to assess for complete GV occlusion. Electronically Signed   By: Jerilynn Mages.  Shick M.D.   On: 07/04/2022 17:34   IR EMBO ART  VEN HEMORR LYMPH EXTRAV  INC GUIDE ROADMAPPING  Result Date: 07/04/2022 CLINICAL DATA:  LARGE BLEEDING GASTRIC VARICES RESULTING IN ACUTE BLOOD LOSS ANEMIA EXAM: ULTRASOUND GUIDANCE FOR VASCULAR ACCESS LEFT RENAL CATHETERIZATION AND VENOGRAM LEFT GASTRO RENAL SHUNT CATHETERIZATION, VENOGRAM BALLOON OCCLUDED RETROGRADE TRANSVENOUS OBLITERATION (BRTO OF THE GASTRIC VARICES MEDICATIONS: As antibiotic prophylaxis, 2 G ANCEF was ordered pre-procedure and administered intravenously within one hour of incision. ANESTHESIA/SEDATION: General - as administered by the Anesthesia department CONTRAST:  65 cc Isovue 300 FLUOROSCOPY: Radiation Exposure Index (as provided by the fluoroscopic device): 854 mGy Kerma COMPLICATIONS: None immediate. PROCEDURE: Informed written consent was obtained from the patient's mother Gwenette Greet Abruzzese) after a thorough discussion of the procedural risks, benefits and alternatives. All questions were addressed. Maximal Sterile Barrier Technique was utilized including caps, mask, sterile gowns, sterile gloves, sterile drape, hand hygiene and skin antiseptic. A timeout was performed prior to the initiation of  the procedure. Previous imaging reviewed. Under sterile conditions and general anesthesia, right common femoral vein micropuncture access performed with ultrasound. Images obtained for documentation of the patent right common femoral vein. Guidewire inserted easily followed by the micro dilator set. Tract dilatation performed to insert the 35 cm 10 French braided TIPS sheath into the IVC. Five [place] Kumpe catheter advanced into the left renal vein. Initial left renal venogram performed. Left renal vein is widely patent. Large gastro renal shunt noted superiorly correlating with the CT findings. Kumpe catheter and Glidewire utilized to access the gastro renal shunt. Five French catheter advanced into the gastro renal shunt over a Glidewire. Initial shunt venogram performed. Gastro renal shunt is incompletely opacified but widely patent. Amplatz guidewire inserted. Ten French sheath advanced into the orifice of the gastro renal shunt. 11.5 mm balloon occlusion catheter advanced over the Amplatz guidewire into the gastro renal shunt past the level of the outflow valve. 11.5 mm balloon was inflated for balloon occlusion. Retrograde CO2 balloon occluded venogram performed. CO2 venogram: This confirms the large gastric varices with retrograde communication visualized via the posterior gastric to the splenic vein and the left gastric vein to the portal mesenteric confluence. Mesenteric and portal veins are also patent and normal in caliber. The double marker lantern microcatheter was advanced through the balloon occlusion catheter into the gastric varix. Contrast injection confirms position. Balloon occluded retrograde transvenous obliteration performed of the gastric varices by slowly instilling approximally 40 cc total volume of foam sclerotherapy (mixture of 6 cc air, 4 cc 3% ST S, and 2 cc Lipiodol). Reflux was visualized within the posterior gastric vein communicating with the splenic vein. At this point, no further  sclerotherapy injected. Lantern catheter was removed and flushed. This was replaced within the gastro renal shunt above the balloon occlusion catheter. Coil embolization performed by deploying a total of 14 Ruby coils ranging in size from 28-40 mm and 60 cm length. Several additional packing coils were also deployed for the coil pack. Following this, the balloon was left  inflated for 10 minutes. Under fluoroscopy, the balloon was slowly deflated without any coil pack migration. Repeat venogram confirms occlusion of the gastro renal shunt and the gastric varices. Orifice of the gastro renal shunt into the renal vein remains patent. Balloon occlusion catheter removed. Final venogram performed confirming preserved patency of the left renal vein. Access removed. Hemostasis obtained with manual compression. Patient tolerated the procedure well. IMPRESSION: Successful balloon occluded retrograde transvenous obliteration (BRTO) of the gastric varices as detailed above. Follow-up CTA (BRTO protocol) will be performed 48 hours to assess for complete GV occlusion. Electronically Signed   By: Jerilynn Mages.  Shick M.D.   On: 07/04/2022 17:34   CT Angio Abd/Pel w/ and/or w/o  Result Date: 07/03/2022 CLINICAL DATA:  51 year old male with history of portal hypertension and hematemesis. EXAM: CTA ABDOMEN AND PELVIS WITHOUT AND WITH CONTRAST TECHNIQUE: Multidetector CT imaging of the abdomen and pelvis was performed using the standard protocol during bolus administration of intravenous contrast. Multiplanar reconstructed images and MIPs were obtained and reviewed to evaluate the vascular anatomy. RADIATION DOSE REDUCTION: This exam was performed according to the departmental dose-optimization program which includes automated exposure control, adjustment of the mA and/or kV according to patient size and/or use of iterative reconstruction technique. CONTRAST:  91m OMNIPAQUE IOHEXOL 350 MG/ML SOLN COMPARISON:  05/20/2019 FINDINGS: VASCULAR  Aorta: Patent normal caliber throughout very mild infrarenal atherosclerotic calcifications. Celiac: Patent without evidence of aneurysm, dissection, vasculitis or significant stenosis. SMA: Patent without evidence of aneurysm, dissection, vasculitis or significant stenosis. Renals: Early branching single right and dual left renal arteries are patent without evidence of aneurysm, dissection, vasculitis, fibromuscular dysplasia or significant stenosis. IMA: Patent without evidence of aneurysm, dissection, vasculitis or significant stenosis. Inflow: Patent without evidence of aneurysm, dissection, vasculitis or significant stenosis. Proximal Outflow: Bilateral common femoral and visualized portions of the superficial and profunda femoral arteries are patent without evidence of aneurysm, dissection, vasculitis or significant stenosis. Veins: The hepatic veins are poorly opacified, however appear patent. The intrahepatic portal veins, main vein, superior mesenteric vein, splenic vein, and inferior mesenteric vein is widely patent and normal in caliber. There is a large gastro renal shunt measuring up to 25 mm diameter at its junction with the left vein. Multiple large gastric varices are visualized. There are multiple smaller distal esophageal varices present. Mildly prominent left and posterior gastric veins. The renal veins are patent bilaterally with retroaortic left renal vein. There is compression of left renal vein by the overlying aorta. No evidence of iliocaval thrombosis or anomaly. Moderate compression of left common iliac vein the overlying right common iliac artery. Review of the MIP images confirms the above findings. NON-VASCULAR Lower chest: No acute abnormality. Hepatobiliary: Mildly shrunken right lobe of the liver with left lobe hypertrophy. Diffuse macronodular contour. Scattered well-defined water density hypoattenuating rounded lesions throughout the hepatic parenchyma, the largest in segment 2  measuring up to 1.4 cm. The gallbladder is present and unremarkable. No intra or extrahepatic biliary ductal dilation. Pancreas: Unremarkable. No pancreatic ductal dilatation or surrounding inflammatory changes. Spleen: Measures up to 11 cm in maximum craniocaudal dimension. No focal mass. Adrenals/Urinary Tract: Adrenal glands are unremarkable. Similar appearing scattered simple cysts, largest in left interpolar region measuring 0.9 cm. Kidneys are otherwise normal, without renal calculi, focal lesion, or hydronephrosis. Bladder is unremarkable. Stomach/Bowel: Multiple large varices within the gastric cardia. The stomach is otherwise within limits. Appendix appears normal. Mild diffuse colonic mural thickening without surrounding inflammatory changes. No evidence of bowel wall thickening, distention,  or inflammatory changes. Lymphatic: No abdominopelvic lymphadenopathy. Reproductive: Prostate gland measures up to 5.3 cm in maximum axial dimension. Other: Trace ascites, most prominent the bilateral upper quadrants. Tiny fat containing umbilical hernia. Musculoskeletal: No acute or significant osseous findings. IMPRESSION: VASCULAR 1. Portal hypertension as evidenced by large gastrorenal shunt and multiple prominent gastroesophageal varices in addition to trace ascites and colonic portal enteropathy. 2.  Aortic Atherosclerosis (ICD10-I70.0). NON-VASCULAR 1. Morphologic changes of hepatic cirrhosis.  No hepatoma. 2. Prostatomegaly. Ruthann Cancer, MD [facility] Electronically Signed   By: Ruthann Cancer M.D.   On: 07/03/2022 10:46   DG CHEST PORT 1 VIEW  Result Date: 07/03/2022 CLINICAL DATA:  [id number] with ventilator dependent respiratory failure. EXAM: PORTABLE CHEST 1 VIEW COMPARISON:  Portable chest yesterday at 11:07 p.m. FINDINGS: 4:13 a.m. ETT interval insertion with tip 4.5 cm from the carina, mid tracheal. The cardiac size is normal. The mediastinum  is normally outlined. No vascular congestion is seen. The lungs are clear. The sulci are sharp. The thoracic cage is intact. There are multiple overlying monitor wires. IMPRESSION: 1. No evidence of acute chest disease. 2. ETT interval insertion with tip 4.5 cm from the carina. Electronically Signed   By: Telford Nab M.D.   On: 07/03/2022 04:28   DG Abd Acute W/Chest  Result Date: 07/02/2022 CLINICAL DATA:  Hematemesis EXAM: DG ABDOMEN ACUTE WITH 1 VIEW CHEST COMPARISON:  05/20/2019 FINDINGS: Cardiac shadow is within normal limits. The lungs are clear bilaterally. Scattered large and small bowel gas is noted. No obstructive changes are seen. No free air is noted. No bony abnormality is seen. IMPRESSION: No acute abnormality noted. Electronically Signed   By: Inez Catalina M.D.   On: 07/02/2022 23:25      Subjective:  Denies any complaints today, reports light brown BM this morning, denies any melena, nausea or vomiting, eager to go home today. Discharge Exam: Vitals:   07/08/22 1104 07/08/22 1120  BP: 115/70 118/66  Pulse: 90 94  Resp: 18 18  Temp: 98.4 F (36.9 C) 98.1 F (36.7 C)  SpO2: 95%    Vitals:   07/08/22 0733 07/08/22 0854 07/08/22 1104 07/08/22 1120  BP: 102/62 110/68 115/70 118/66  Pulse: 86 87 90 94  Resp:  '18 18 18  '$ Temp: 98.7 F (37.1 C) 98.6 F (37 C) 98.4 F (36.9 C) 98.1 F (36.7 C)  TempSrc: Oral Oral Oral   SpO2: 92% 96% 95%   Weight:      Height:        General: Pt is alert, awake, not in acute distress Cardiovascular: RRR, S1/S2 +, no rubs, no gallops Respiratory: CTA bilaterally, no wheezing, no rhonchi Abdominal: Soft, NT, ND, bowel sounds + Extremities: no edema, no cyanosis    The results of significant diagnostics from this hospitalization (including imaging, microbiology, ancillary and laboratory) are listed below for reference.     Microbiology: Recent Results (from the past 240 hour(s))  Culture, blood (Routine X 2) w Reflex to ID  Panel     Status: None   Collection Time: 07/03/22  3:45 AM   Specimen: BLOOD  Result Value Ref Range Status   Specimen Description BLOOD SITE NOT SPECIFIED  Final   Special Requests   Final    BOTTLES DRAWN AEROBIC AND ANAEROBIC Blood Culture adequate volume   Culture   Final    NO GROWTH 5 DAYS Performed at [facility], [address], [place], [place]  [address]    Report Status 07/08/2022 FINAL  Final  Culture, blood (Routine X 2) w Reflex to ID Panel     Status: None   Collection Time: 07/03/22  6:19 AM   Specimen: BLOOD RIGHT FOREARM  Result Value Ref Range Status   Specimen Description BLOOD RIGHT FOREARM  Final   Special Requests   Final    BOTTLES DRAWN AEROBIC AND ANAEROBIC Blood Culture adequate volume   Culture   Final    NO GROWTH 5 DAYS Performed at [facility], [place] [address] [place], [place] [address]    Report Status 07/08/2022 FINAL  Final     Labs: BNP (last 3 results) Recent Labs    06/10/22 1300  BNP 93.7   Basic Metabolic Panel: Recent Labs  Lab 07/04/22 0420 07/05/22 0423 07/05/22 0424 07/06/22 0205 07/07/22 0157 07/08/22 0436  NA 140 138  --  137 133* 130*  K 3.8 3.6  --  3.4* 3.4* 3.9  CL 114* 109  --  108 105 100  CO2 21* 22  --  22 22 21*  GLUCOSE 129* 118*  --  106* 96 96  BUN 22* 17  --  '15 14 13  '$ CREATININE 0.79 0.83  --  0.70 0.68 0.73  CALCIUM 7.2* 7.7*  --  7.9* 7.6* 7.5*  PHOS  --   --  2.9  --   --   --    Liver Function Tests: Recent Labs  Lab 07/04/22 0420 07/05/22 0423 07/06/22 0205 07/07/22 0157 07/08/22 0436  AST 50* 41 50* 50* 53*  ALT '19 18 19 20 21  '$ ALKPHOS 37* 34* 36* 51 44  BILITOT 3.2* 3.1* 4.8* 3.3* 3.3*  PROT 4.5* 4.8* 5.0* 4.9* 4.7*  ALBUMIN 2.0* 2.6* 2.5* 2.4* 2.2*   Recent Labs  Lab 07/02/22 2300  LIPASE 28   No results for input(s): "AMMONIA" in the last 168 hours. CBC: Recent Labs  Lab 07/02/22 2300 07/02/22 2310 07/03/22 0331 07/03/22 1037 07/05/22 1444 07/05/22 1928  07/06/22 0205 07/07/22 0157 07/07/22 1400 07/08/22 0436 07/08/22 1609  WBC 25.3*  --  18.7*   < > 9.1  --  9.5 10.2 9.8 9.2  --   NEUTROABS 21.8*  --  16.8*  --   --   --   --   --   --   --   --   HGB 5.9*   < > 9.7*   < > 6.8*   < > 7.8* 7.2* 8.2* 7.0* 9.0*  HCT 17.7*   < > 27.4*   < > 20.4*   < > 23.5* 21.4* 24.6* 21.4* 26.9*  MCV 106.6*  --  91.9   < > 99.5  --  97.1 97.7 98.8 98.6  --   PLT 147*  --  53*   < > 47*  --  56* 66* 83* 88*  --    < > = values in this interval not displayed.   Cardiac Enzymes: No results for input(s): "CKTOTAL", "CKMB", "CKMBINDEX", "TROPONINI" in the last 168 hours. BNP: Invalid input(s): "POCBNP" CBG: Recent Labs  Lab 07/03/22 0513 07/03/22 1147 07/03/22 1641 07/04/22 0347  GLUCAP 150* 122* 120* 101*   D-Dimer No results for input(s): "DDIMER" in the last 72 hours. Hgb A1c No results for input(s): "HGBA1C" in the last 72 hours. Lipid Profile No results for input(s): "CHOL", "HDL", "LDLCALC", "TRIG", "CHOLHDL", "LDLDIRECT" in the last 72 hours. Thyroid function studies No results for  input(s): "TSH", "T4TOTAL", "T3FREE", "THYROIDAB" in the last 72 hours.  Invalid input(s): "FREET3" Anemia work up No results for input(s): "VITAMINB12", "FOLATE", "FERRITIN", "TIBC", "IRON", "RETICCTPCT" in the last 72 hours. Urinalysis No results found for: "COLORURINE", "APPEARANCEUR", "LABSPEC", "PHURINE", "GLUCOSEU", "HGBUR", "BILIRUBINUR", "KETONESUR", "PROTEINUR", "UROBILINOGEN", "NITRITE", "LEUKOCYTESUR" Sepsis Labs Recent Labs  Lab 07/06/22 0205 07/07/22 0157 07/07/22 1400 07/08/22 0436  WBC 9.5 10.2 9.8 9.2   Microbiology Recent Results (from the past 240 hour(s))  Culture, blood (Routine X 2) w Reflex to ID Panel     Status: None   Collection Time: 07/03/22  3:45 AM   Specimen: BLOOD  Result Value Ref Range Status   Specimen Description BLOOD SITE NOT SPECIFIED  Final   Special Requests   Final    BOTTLES DRAWN AEROBIC AND ANAEROBIC  Blood Culture adequate volume   Culture   Final    NO GROWTH 5 DAYS Performed at [facility], [address] [place], [place] [address]    Report Status 07/08/2022 FINAL  Final  Culture, blood (Routine X 2) w Reflex to ID Panel     Status: None   Collection Time: 07/03/22  6:19 AM   Specimen: BLOOD RIGHT FOREARM  Result Value Ref Range Status   Specimen Description BLOOD RIGHT FOREARM  Final   Special Requests   Final    BOTTLES DRAWN AEROBIC AND ANAEROBIC Blood Culture adequate volume   Culture   Final    NO GROWTH 5 DAYS Performed at [facility], [place] [address], [place], [place] [address]    Report Status 07/08/2022 FINAL  Final     Time coordinating discharge: Over 30 minutes  SIGNED:   Phillips Climes, MD  [facility] 07/08/2022, 4:44 PM Pager   If 7PM-7AM, please contact night-coverage [email or web address] Password [email or web address]

## 2022-07-08 NOTE — TOC Progression Note (Signed)
Transition of Care [facility]) - Progression Note    Patient Details  Name: Stephen Miranda MRN: [id number] Date of Birth: Aug 12, 1970  [facility] CM/SW Contact  Pollie Friar, RN Phone Number: 07/08/2022, 12:52 PM  Clinical Narrative:    Pt is from home alone. He goes to the [facility] clinic for his PCP. Pt states they assist him with the cost of his medications.  Pt drives self as needed. He states he has family that can check on him. No DME.  CM following for d/c needs and for MATCH at discharge.    Expected Discharge Plan: Home/Self Care Barriers to Discharge: Continued Medical Work up  Expected Discharge Plan and Services Expected Discharge Plan: Home/Self Care   Discharge Planning Services: CM Consult   Living arrangements for the past 2 months: Apartment                                       Social Determinants of Health (SDOH) Interventions    Readmission Risk Interventions     No data to display

## 2022-07-08 NOTE — TOC Transition Note (Signed)
Transition of Care [facility]) - CM/SW Discharge Note   Patient Details  Name: Stephen Miranda MRN: [id number] Date of Birth: [birth year]  Transition of Care [facility]) CM/SW Contact:  Pollie Friar, RN Phone Number: 07/08/2022, 4:23 PM   Clinical Narrative:    Pt is discharging home with self care. No f/u per PT.  MATCH provided for assistance with d/c medications.  Pt has transportation home.    Final next level of care: Home/Self Care Barriers to Discharge: No Barriers Identified   Patient Goals and CMS Choice Patient states their goals for this hospitalization and ongoing recovery are:: wants to remain independent      Discharge Placement                       Discharge Plan and Services   Discharge Planning Services: CM Consult                                 Social Determinants of Health (SDOH) Interventions     Readmission Risk Interventions     No data to display

## 2022-07-08 NOTE — Plan of Care (Signed)

## 2022-07-09 LAB — BPAM RBC
Blood Product Expiration Date: 202401122359
ISSUE DATE / TIME: 202312181100
Unit Type and Rh: 5100

## 2022-07-09 LAB — TYPE AND SCREEN
ABO/RH(D): O POS
Antibody Screen: NEGATIVE
Unit division: 0

## 2022-07-10 ENCOUNTER — Ambulatory Visit (HOSPITAL_COMMUNITY)
Admission: EM | Admit: 2022-07-10 | Discharge: 2022-07-10 | Disposition: A | Discharge status: Home / Self Care | Payer: Self-pay

## 2022-07-10 ENCOUNTER — Other Ambulatory Visit: Payer: Self-pay

## 2022-07-10 ENCOUNTER — Encounter (HOSPITAL_BASED_OUTPATIENT_CLINIC_OR_DEPARTMENT_OTHER): Payer: Self-pay

## 2022-07-10 ENCOUNTER — Emergency Department (HOSPITAL_BASED_OUTPATIENT_CLINIC_OR_DEPARTMENT_OTHER)
Admission: EM | Admit: 2022-07-10 | Discharge: 2022-07-10 | Disposition: A | Discharge status: Home / Self Care | Payer: Medicaid Other | Attending: Emergency Medicine | Admitting: Emergency Medicine

## 2022-07-10 ENCOUNTER — Emergency Department (HOSPITAL_BASED_OUTPATIENT_CLINIC_OR_DEPARTMENT_OTHER): Payer: Medicaid Other

## 2022-07-10 DIAGNOSIS — K7031 Alcoholic cirrhosis of liver with ascites: Secondary | ICD-10-CM | POA: Insufficient documentation

## 2022-07-10 DIAGNOSIS — R69 Illness, unspecified: Secondary | ICD-10-CM

## 2022-07-10 DIAGNOSIS — R34 Anuria and oliguria: Secondary | ICD-10-CM | POA: Diagnosis not present

## 2022-07-10 DIAGNOSIS — R600 Localized edema: Secondary | ICD-10-CM | POA: Diagnosis not present

## 2022-07-10 DIAGNOSIS — R339 Retention of urine, unspecified: Secondary | ICD-10-CM | POA: Diagnosis present

## 2022-07-10 LAB — COMPREHENSIVE METABOLIC PANEL
ALT: 24 U/L (ref 0–44)
AST: 59 U/L — ABNORMAL HIGH (ref 15–41)
Albumin: 3.1 g/dL — ABNORMAL LOW (ref 3.5–5.0)
Alkaline Phosphatase: 58 U/L (ref 38–126)
Anion gap: 9 (ref 5–15)
BUN: 8 mg/dL (ref 6–20)
CO2: 22 mmol/L (ref 22–32)
Calcium: 8.1 mg/dL — ABNORMAL LOW (ref 8.9–10.3)
Chloride: 101 mmol/L (ref 98–111)
Creatinine, Ser: 0.55 mg/dL — ABNORMAL LOW (ref 0.61–1.24)
GFR, Estimated: >60 mL/min (ref >60)
Glucose, Bld: 106 mg/dL — ABNORMAL HIGH (ref 70–99)
Potassium: 4 mmol/L (ref 3.5–5.1)
Sodium: 132 mmol/L — ABNORMAL LOW (ref 135–145)
Total Bilirubin: 4.1 mg/dL — ABNORMAL HIGH (ref 0.3–1.2)
Total Protein: 5.9 g/dL — ABNORMAL LOW (ref 6.5–8.1)

## 2022-07-10 LAB — URINALYSIS, ROUTINE W REFLEX MICROSCOPIC
Bilirubin Urine: NEGATIVE
Glucose, UA: NEGATIVE mg/dL
Hgb urine dipstick: NEGATIVE
Ketones, ur: NEGATIVE mg/dL
Leukocytes,Ua: NEGATIVE
Nitrite: NEGATIVE
Protein, ur: NEGATIVE mg/dL
Specific Gravity, Urine: 1.018 (ref 1.005–1.030)
pH: 6 (ref 5.0–8.0)

## 2022-07-10 LAB — CBC WITH DIFFERENTIAL/PLATELET
Abs Immature Granulocytes: 0.1 10*3/uL — ABNORMAL HIGH (ref 0.00–0.07)
Basophils Absolute: 0.1 10*3/uL (ref 0.0–0.1)
Basophils Relative: 1 %
Eosinophils Absolute: 0.2 10*3/uL (ref 0.0–0.5)
Eosinophils Relative: 2 %
HCT: 28.1 % — ABNORMAL LOW (ref 39.0–52.0)
Hemoglobin: 9.2 g/dL — ABNORMAL LOW (ref 13.0–17.0)
Immature Granulocytes: 1 %
Lymphocytes Relative: 10 %
Lymphs Abs: 1.2 10*3/uL (ref 0.7–4.0)
MCH: 31.7 pg (ref 26.0–34.0)
MCHC: 32.7 g/dL (ref 30.0–36.0)
MCV: 96.9 fL (ref 80.0–100.0)
Monocytes Absolute: 1.4 10*3/uL — ABNORMAL HIGH (ref 0.1–1.0)
Monocytes Relative: 12 %
Neutro Abs: 9 10*3/uL — ABNORMAL HIGH (ref 1.7–7.7)
Neutrophils Relative %: 74 %
Platelets: 170 10*3/uL (ref 150–400)
RBC: 2.9 MIL/uL — ABNORMAL LOW (ref 4.22–5.81)
RDW: 19.2 % — ABNORMAL HIGH (ref 11.5–15.5)
WBC: 11.9 10*3/uL — ABNORMAL HIGH (ref 4.0–10.5)
nRBC: 0 % (ref 0.0–0.2)

## 2022-07-10 LAB — PROTIME-INR
INR: 1.6 — ABNORMAL HIGH (ref 0.8–1.2)
Prothrombin Time: 18.9 seconds — ABNORMAL HIGH (ref 11.4–15.2)

## 2022-07-10 LAB — LIPASE, BLOOD: Lipase: 70 U/L — ABNORMAL HIGH (ref 11–51)

## 2022-07-10 MED ORDER — FUROSEMIDE 40 MG PO TABS
40.0000 mg | ORAL_TABLET | Freq: Once | ORAL | Status: AC
  Administered 2022-07-10: 40 mg via ORAL
  Filled 2022-07-10: qty 1

## 2022-07-10 NOTE — ED Provider Notes (Signed)
[facility] EMERGENCY DEPT Provider Note   CSN: [id number] Arrival date & time: 07/10/22  1236     History {Add pertinent medical, surgical, social history, OB history to HPI:1} Chief Complaint  Patient presents with   Urinary Retention    Stephen Miranda is a 51 y.o. male.  HPI Patient discharge from the hospital 2 days ago.  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  He denies pain with urination or significant suprapubic pain but does feel he is generally becoming more distended in the abdomen.  Reports he has a pressure and bloating sensation.  He advises he has been staying hydrated and taking in fluids but is not putting out very much urine.  He does note he has had ongoing swelling now of his legs and abdomen and scrotum.  He denies fever or chills.  Reports he does not have pain specifically just general distention bloating perception.  No fevers.  Reports he has been compliant with medications.    Home Medications Prior to Admission medications   Medication Sig Start Date End Date Taking? Authorizing Provider  carvedilol (COREG) 3.125 MG tablet Take 1 tablet (3.125 mg total) by mouth 2 (two) times daily with a meal. 07/08/22   Elgergawy, Silver Huguenin, MD  Multiple Vitamin (MULTIVITAMIN WITH MINERALS) TABS tablet Take 1 tablet by mouth daily. Patient not taking: Reported on 07/02/2022 05/23/19   Debbe Odea, MD  pantoprazole (PROTONIX) 40 MG tablet Take 1 tablet (40 mg total) by mouth 2 (two) times daily. 07/08/22 08/07/22  Elgergawy, Silver Huguenin, MD  tamsulosin (FLOMAX) 0.4 MG CAPS capsule Take 1 capsule (0.4 mg total) by mouth  daily after supper. 07/08/22   Elgergawy, Silver Huguenin, MD      Allergies    Patient has no known allergies.    Review of Systems   Review of Systems  Physical Exam Updated Vital Signs BP 110/67   Pulse 84   Temp 98.3 F (36.8 C) (Oral)   Resp 18   Ht '6\' 1"'$  (1.854 m)   Wt 93.7 kg   SpO2 95%   BMI 27.25 kg/m  Physical Exam Constitutional:      Comments: Clear mental status.  Patient is alert and nontoxic.  No respiratory distress at rest.  Slightly pale in appearance.  No acute distress.  HENT:     Head: Normocephalic and atraumatic.     Mouth/Throat:     Pharynx: Oropharynx is clear.  Eyes:     Comments: Mild scleral icterus  Cardiovascular:     Rate and Rhythm: Normal rate and regular rhythm.  Pulmonary:     Comments: No respiratory distress.  Breath sounds are slightly diminished at the bases.  No crackles or rhonchi. Abdominal:     Comments: Abdomen moderately distended.  Domino wall is not taut.  No guarding.  Moderate tenderness in the right upper quadrant.  No significant suprapubic tenderness or palpable bladder.   Musculoskeletal:     Comments: Patient has pitting edema up to the high thigh and hip area.  Lower legs have some skin thinning and diffuse erythema consistent with chronic venous stasis.  Above the knees to the hips patient has pitting with normal skin quality.  Muscular  atrophy of trunk and upper extremities.  Difficult to assess for lower extremities given edema.  Neurological:     General: No focal deficit present.     Mental Status: He is oriented to person, place, and time.     Comments: No focal weakness.  Patient's mental status is clear.  Follows commands appropriately.  Psychiatric:        Mood and Affect: Mood normal.     ED Results / Procedures / Treatments   Labs (all labs ordered are listed, but only abnormal results are displayed) Labs Reviewed  COMPREHENSIVE METABOLIC PANEL - Abnormal; Notable for the following components:      Result  Value   Sodium 132 (*)    Glucose, Bld 106 (*)    Creatinine, Ser 0.55 (*)    Calcium 8.1 (*)    Total Protein 5.9 (*)    Albumin 3.1 (*)    AST 59 (*)    Total Bilirubin 4.1 (*)    All other components within normal limits  LIPASE, BLOOD - Abnormal; Notable for the following components:   Lipase 70 (*)    All other components within normal limits  CBC WITH DIFFERENTIAL/PLATELET - Abnormal; Notable for the following components:   WBC 11.9 (*)    RBC 2.90 (*)    Hemoglobin 9.2 (*)    HCT 28.1 (*)    RDW 19.2 (*)    Neutro Abs 9.0 (*)    Monocytes Absolute 1.4 (*)    Abs Immature Granulocytes 0.10 (*)    All other components within normal limits  PROTIME-INR - Abnormal; Notable for the following components:   Prothrombin Time 18.9 (*)    INR 1.6 (*)    All other components within normal limits  URINALYSIS, ROUTINE W REFLEX MICROSCOPIC    EKG None  Radiology CT ABDOMEN PELVIS WO CONTRAST  Result Date: 07/10/2022 CLINICAL DATA:  A 51 year old male presents with decreased urine output and ascites. EXAM: CT ABDOMEN AND PELVIS WITHOUT CONTRAST TECHNIQUE: Multidetector CT imaging of the abdomen and pelvis was performed following the standard protocol without IV contrast. RADIATION DOSE REDUCTION: This exam was performed according to the departmental dose-optimization program which includes automated exposure control, adjustment of the mA and/or kV according to patient size and/or use of iterative reconstruction technique. COMPARISON:  July 05, 2022. FINDINGS: Lower chest: Basilar atelectasis without effusion. Hepatobiliary: Shrunken and nodular appearance of the liver. Areas of geographic low attenuation favored to represent findings of fibrosis but not well assessed. Sludge and/or stones in the gallbladder. Nodular hepatic contours. Hepatic lesions with low-density, likely cysts which are not well assessed. These lesions are pre-existing dating back to 2020 however Pancreas: Grossly  unremarkable. Generalized edema throughout the abdomen. No focal pancreatic fluid or inflammation. Spleen: Mild splenomegaly, unchanged. 14 cm greatest craniocaudal dimension. Adrenals/Urinary Tract: Adrenal glands are normal. No signs of hydronephrosis. No nephrolithiasis. Urinary bladder is collapsed. Stomach/Bowel: Normal appendix. Thickening of the stomach with stable appearance following transvenous obliteration of gastroesophageal varices. Stomach less distended than on previous imaging. Streak artifact limiting assessment. Generalized bowel edema in the setting of portal hypertension. No pericolonic stranding. Vascular/Lymphatic: Post occlusion of gastric, perigastric and periesophageal varices with embolization material. Streak artifact in the upper abdomen as result of this procedure. Normal caliber of the abdominal aorta. Retroaortic LEFT renal vein with occluded splenorenal/gastro renal shunting. No adenopathy in the abdomen. No adenopathy in the pelvis. Reproductive: Unremarkable by CT, not well assessed. Other: Increase in ascites now with  moderately large volume of ascites mainly in the lower abdomen and pelvis. Small umbilical hernia contains a knuckle of small bowel. No sign of obstruction. Musculoskeletal: Body wall edema as on previous imaging. No acute bone finding. No destructive bone process. IMPRESSION: 1. Increase in ascites, now with moderately large volume of ascites mainly in the lower abdomen and pelvis. 2. Marked cirrhosis and evidence of portal hypertension with changes of occlusion of varices about the stomach and esophagus. 3. Umbilical hernia now contains a small knuckle of small bowel. No sign of obstruction. Would however correlate with any worsening abdominal symptoms about the umbilicus given that this may represent a Richter's type hernia which has a higher incidence of complication than a hernia which contains the entire bowel loop. These results will be called to the ordering  clinician or representative by the Radiologist Assistant, and communication documented in the PACS or [employer]. Electronically Signed   By: Zetta Bills M.D.   On: 07/10/2022 16:18    Procedures Procedures  {Document cardiac monitor, telemetry assessment procedure when appropriate:1}  Medications Ordered in ED Medications  furosemide (LASIX) tablet 40 mg (has no administration in time range)    ED Course/ Medical Decision Making/ A&P                           Medical Decision Making Amount and/or Complexity of Data Reviewed Labs: ordered. Radiology: ordered.  Risk Prescription drug management.   Patient presents as outlined having noted decreased urine output.  He had severe illness requiring hospitalization with discharge 2 days ago.  Patient had acute GI bleed including esophageal varices.  Today's review of systems does not indicate any vomiting and he has not noted dark stool.  At this time appears lower risk for acute GI bleed.  Will need to proceed with checking CBC and coagulation panels.  Will obtain urinalysis.  On exam patient does have abdominal distention and lower extremity edema consistent with anasarca.  Will proceed with CT abdomen to evaluate ascites and possible bladder obstruction.  {Document critical care time when appropriate:1} {Document review of labs and clinical decision tools ie heart score, Chads2Vasc2 etc:1}  {Document your independent review of radiology images, and any outside records:1} {Document your discussion with family members, caretakers, and with consultants:1} {Document social determinants of health affecting pt's care:1} {Document your decision making why or why not admission, treatments were needed:1} Final Clinical Impression(s) / ED Diagnoses Final diagnoses:  Ascites due to alcoholic cirrhosis ([place])  Decreased urination  Severe comorbid illness    Rx / DC Orders ED Discharge Orders     None

## 2022-07-10 NOTE — ED Triage Notes (Signed)
C/O urinary retention since last night; states has taken in approx 1061m fluids PO, but only has had approx 3058moutput; c/o feeling pressure.  Pt reports having had "emergency liver surgery last week" for internal bleeding; hx esophageal varices.  Discussed with Dr HaMannie StabileInstructed pt to be seen in ED. [facility]  Patient is being discharged from the Urgent Care and sent to the Emergency Department via private vehicle with friend . Per Dr HaMannie Stabilepatient is in need of higher level of care due to urinary retention, S/P IR with anesthesia for GI Bleed. Patient is aware and verbalizes understanding of plan of care.  Vitals:   07/10/22 1205  BP: 116/62  Pulse: 90  SpO2: 100%

## 2022-07-10 NOTE — Discharge Instructions (Signed)
1.  You are building up excess fluid in the abdomen from liver failure.  2.  At this time your evaluation does not suggest that your bladder is obstructed.  Your kidney function is still normal. 3.  You have been given a dose of Lasix in the emergency department to help decrease your fluid volume.  This should make you urinate more frequently. 4.  When you follow-up with your doctor tomorrow, discuss ongoing management of this body fluid due to liver failure.  You may need several more days of Lasix and close follow-up with her gastroenterologist. 5.  Return if you develop a fever, return if you develop abdominal pain, pain with urination or other concerning changes.

## 2022-07-10 NOTE — ED Triage Notes (Signed)
Patient here POV from UC.  Endorses Emergency Liver Surgery 1 Week ago. Discharged 2 Days ago. Has not been able to void properly since. Had a "Trickle" 1 few hours ago.   Sent by UC for Evaluation  NAD Noted during Triage. A&Ox4. Gcs 15. Ambulatory.

## 2022-07-10 NOTE — ED Notes (Signed)
Pt awake and alert lying in bed; GCS 15.  Pt denies c/o abd pain; denies n/v/d; denies sob.  BLE nonpitting edema noted with LE petechiae. .   RR even and unlabored on RA with symmetrical rise and fall of chest.  Pt agreeable with d/c plan as discussed by provider- this nurse has verbally reinforced d/c instructions and provided pt with written copy - pt acknowledges verbal understanding and denies any addl questions, concerns, needs- pt declines w/c; ambulatory at d/c independently with steady gait accompanied by family member; vitals stable; no acute distress or changes noted.

## 2022-07-10 NOTE — ED Notes (Signed)
Bladder Scan: >95m.

## 2022-07-11 ENCOUNTER — Encounter: Payer: Self-pay | Admitting: Internal Medicine

## 2022-07-11 ENCOUNTER — Telehealth: Payer: Self-pay

## 2022-07-11 ENCOUNTER — Ambulatory Visit (INDEPENDENT_AMBULATORY_CARE_PROVIDER_SITE_OTHER): Payer: Self-pay | Admitting: Internal Medicine

## 2022-07-11 VITALS — BP 112/66 | HR 92 | Resp 12 | Ht 73.5 in | Wt 208.0 lb

## 2022-07-11 DIAGNOSIS — K7031 Alcoholic cirrhosis of liver with ascites: Secondary | ICD-10-CM

## 2022-07-11 DIAGNOSIS — K922 Gastrointestinal hemorrhage, unspecified: Secondary | ICD-10-CM

## 2022-07-11 DIAGNOSIS — R188 Other ascites: Secondary | ICD-10-CM

## 2022-07-11 MED ORDER — CARVEDILOL 3.125 MG PO TABS: 3.1250 mg | ORAL_TABLET | Freq: Two times a day (BID) | ORAL | 11 refills | Status: DC

## 2022-07-11 MED ORDER — TAMSULOSIN HCL 0.4 MG PO CAPS: 0.4000 mg | ORAL_CAPSULE | Freq: Every day | ORAL | 11 refills | Status: DC

## 2022-07-11 MED ORDER — FUROSEMIDE 20 MG PO TABS: ORAL_TABLET | ORAL | 11 refills | Status: DC

## 2022-07-11 MED ORDER — SPIRONOLACTONE 50 MG PO TABS: ORAL_TABLET | ORAL | 11 refills | Status: DC

## 2022-07-11 MED ORDER — PANTOPRAZOLE SODIUM 40 MG PO TBEC: 40.0000 mg | DELAYED_RELEASE_TABLET | Freq: Two times a day (BID) | ORAL | 11 refills | Status: DC

## 2022-07-11 NOTE — Telephone Encounter (Signed)
Received a call from Dr. Amil Amen regarding mutual patient. Dr. Amil Amen states that she recently saw patient he has developed ascites and lower extremity edema up to his wait. While hospitalized patient was started on Lasix 20 mg daily, PCP added Spironolactone 50 mg to be taken in addition to Lasix daily. Dr. Amil Amen states that patient has tolerated this diuretic regimen in the past she just wants to be sure that it is OK to continue due to BRTO that was performed. Please advise, thanks.

## 2022-07-11 NOTE — Progress Notes (Signed)
Subjective:    Patient ID: Stephen Miranda, male   DOB: 29-Jan-1971, 51 y.o.   MRN: [id number]   HPI  Recent hospitalization 07/02/22 through 07/08/22 for large variceal/UGI bleed.  Gastric fundus veins were found to be the culprit on EGD.   Received 4 U PRBCs and FFP.  Ultimately, underwent gastro-renal shunt with Interventional Radiology and gastric veins were obliterated. Hemoglobin at discharge 12/18:  9.0 Sodium:  130 L Alb:  2.2 L AST:  53 H Tbil:  3.3 H States when discharged on the 18th, noted he did have some edema of legs up above knees.  He did not note the abdominal tightness. Not clear how he and family assessing sodium intake. He was found to have an enlarged prostate on CT and started on Tamsulosin.  States was having good UO at time of discharge.    Yesterday, returned to ED as abdomen became tight with increasing LE edema and decreased UO.  States was a bit short of breath at times. States following a low sodium diet. Hemoglobin stable at 9.2 INR stable at 1.6 Sodium a bit better at 132  Potassium 4.0 BUN:  8 Crea 0.55 CT scan showed moderately large volume of ascites.  He also now has a knuckle of small bowel in umbilical hernia. Was noted to have edematous bowel, but given oral Furosemide 40 mg.  Did respond with increased urine output.  He denies discomfort from this area.   States swelling in legs and abdominal tightness a bit better today. Breathing at times short with abdominal tightness.  No melena or hematochezia. No nausea or vomiting. No fever.     Current Meds  Medication Sig   carvedilol (COREG) 3.125 MG tablet Take 1 tablet (3.125 mg total) by mouth 2 (two) times daily with a meal.   Multiple Vitamin (MULTIVITAMIN WITH MINERALS) TABS tablet Take 1 tablet by mouth daily.   pantoprazole (PROTONIX) 40 MG tablet Take 1 tablet (40 mg total) by mouth 2 (two) times daily.   tamsulosin (FLOMAX) 0.4 MG CAPS capsule Take 1 capsule (0.4 mg total) by mouth  daily after supper.   No Known Allergies   Review of Systems    Objective:   BP 112/66 (BP Location: Right Arm, Patient Position: Sitting, Cuff Size: Normal)   Pulse 92   Resp 12   Ht 6' 1.5" (1.867 m)   Wt 208 lb (94.3 kg)   BMI 27.07 kg/m   Physical Exam NAD, pale and chronically ill appearing. Lungs:  CTA CV:  RRR without murmur or rub.  Radial pulses normal and equal Abd:  NT, + BS, No HSM or mass.  + shifting dullness and abdomen protuberant.   LE:  Mild pitting edema of legs to lower sacral area.     Assessment & Plan   Cirrhosis with ascites and recent hospitalization for severe gastric variceal/UGI bleed.  History of ascites to which he responded well to spironolactone and furosemide and ultimately had to take down to low dosing before he self titrated off when lost to care.  Check CBC, CMP again following mild diuresis over past 24 hours.  Start spironolactone 50 and Furosemide 20 mg at same time daily in morning.  2g sodium diet.  Weigh daily and measure abdominal girth.  Discussed with patient and also called his mother, Stephen Miranda, who is a retired Scientist, research (physical sciences) to make sure they are aware of what he is to be doing.  To call over holiday if weight  and abdominal girth not decreasing or going up.  Call into GI to see if any contraindication for diuretics with shunt.  Follow up with labs and recheck 07/25/2021.  2.  UGI bleed:  as above.  CBC.   Addendum:  mother, Stephen Miranda called on 12/26 and stated while he does appear to have decreased edema, still uncomfortable with ascites.  Discussed increasing dose of Aldactone to 100 mg and Furosemide to 40 mg daily for 2 days and then follow up with phone call to see how he is doing.  Has GI follow up this week.

## 2022-07-11 NOTE — Telephone Encounter (Signed)
JMP, Thanks for taking this.  [place], Agree with increased diuretics. He is BMP should be checked in 1 week after the diuretics are increased. With the ascites being much larger, based on the CT scan from yesterday, I recommend an ascites tap be performed as well by interventional radiology.  Please send Cell count/Culture/total protein/albumin/LDH/cytology.  If more than 3 L of ascites is taken, then 25 g of albumin should be administered. Please ensure that he has a follow-up with our interventional radiologist in regards to portal hypertension clinic after his BRTO. Would recommend that he also get a liver Doppler ultrasound. Hopefully he will not need a TIPS. Agree with the current time for scheduled follow-up with NP Guenther on 07/24/2022 that is arranged. Thanks. GM

## 2022-07-11 NOTE — Telephone Encounter (Signed)
Lm on Dr. Melissa Noon cell requesting that she give me a call to discuss recommendations. I provided my direct office #.

## 2022-07-11 NOTE — Telephone Encounter (Signed)
Pt intially seen by GM. I did see him recently and I see he has followup with PG. Diuretic should be fine and if increasing ascites he needs to follow a strict 2 g sodium diet and would consider increasing furosemide to 40 mg daily and spironolactone to 100 mg daily.  Renal function and electrolytes need to be followed closely with diuretic titration Thanks JMP

## 2022-07-11 NOTE — Patient Instructions (Signed)
Measure abdomen at umbilical level daily Weigh daily Call if either increasing

## 2022-07-11 NOTE — Telephone Encounter (Signed)
Received vm from Dr. Amil Amen asking that I leave her a detailed vm with recommendations on her cell. Called Dr. Amil Amen on her cell again and left her a detailed vm with Dr. Vena Rua recommendations as outlined below. I informed her that I did not leave a detailed vm prior b/c I was not sure if her vm was secure. I advised Dr. Amil Amen to contact the office if she had any additional questions or concerns.

## 2022-07-12 ENCOUNTER — Other Ambulatory Visit: Payer: Self-pay

## 2022-07-12 DIAGNOSIS — R188 Other ascites: Secondary | ICD-10-CM

## 2022-07-12 LAB — COMPREHENSIVE METABOLIC PANEL
ALT: 27 IU/L (ref 0–44)
AST: 69 IU/L — ABNORMAL HIGH (ref 0–40)
Albumin/Globulin Ratio: 0.9 — ABNORMAL LOW (ref 1.2–2.2)
Albumin: 2.7 g/dL — ABNORMAL LOW (ref 4.1–5.1)
Alkaline Phosphatase: 84 IU/L (ref 44–121)
BUN/Creatinine Ratio: 11 (ref 9–20)
BUN: 8 mg/dL (ref 6–24)
Bilirubin Total: 3 mg/dL — ABNORMAL HIGH (ref 0.0–1.2)
CO2: 21 mmol/L (ref 20–29)
Calcium: 8.1 mg/dL — ABNORMAL LOW (ref 8.7–10.2)
Chloride: 100 mmol/L (ref 96–106)
Creatinine, Ser: 0.74 mg/dL — ABNORMAL LOW (ref 0.76–1.27)
Globulin, Total: 3.1 g/dL (ref 1.5–4.5)
Glucose: 87 mg/dL (ref 70–99)
Potassium: 4.7 mmol/L (ref 3.5–5.2)
Sodium: 134 mmol/L (ref 134–144)
Total Protein: 5.8 g/dL — ABNORMAL LOW (ref 6.0–8.5)
eGFR: 110 mL/min/{1.73_m2} (ref >59)

## 2022-07-12 LAB — CBC WITH DIFFERENTIAL/PLATELET
Basophils Absolute: 0.1 10*3/uL (ref 0.0–0.2)
Basos: 1 %
EOS (ABSOLUTE): 0.2 10*3/uL (ref 0.0–0.4)
Eos: 1 %
Hematocrit: 26.6 % — ABNORMAL LOW (ref 37.5–51.0)
Hemoglobin: 9.3 g/dL — ABNORMAL LOW (ref 13.0–17.7)
Immature Grans (Abs): 0.1 10*3/uL (ref 0.0–0.1)
Immature Granulocytes: 1 %
Lymphocytes Absolute: 1 10*3/uL (ref 0.7–3.1)
Lymphs: 9 %
MCH: 31.8 pg (ref 26.6–33.0)
MCHC: 35 g/dL (ref 31.5–35.7)
MCV: 91 fL (ref 79–97)
Monocytes Absolute: 1.1 10*3/uL — ABNORMAL HIGH (ref 0.1–0.9)
Monocytes: 10 %
Neutrophils Absolute: 9.2 10*3/uL — ABNORMAL HIGH (ref 1.4–7.0)
Neutrophils: 78 %
Platelets: 207 10*3/uL (ref 150–450)
RBC: 2.92 x10E6/uL — ABNORMAL LOW (ref 4.14–5.80)
RDW: 15.2 % (ref 11.6–15.4)
WBC: 11.6 10*3/uL — ABNORMAL HIGH (ref 3.4–10.8)

## 2022-07-12 NOTE — Telephone Encounter (Signed)
Attempted to reach pt and voice mail is full

## 2022-07-12 NOTE — Addendum Note (Signed)
Addended by: Timothy Lasso on: 07/12/2022 01:52 PM   Modules accepted: Orders

## 2022-07-12 NOTE — Telephone Encounter (Signed)
Lab order entered  Paracentesis and [place] order sent to the schedulers  Referral to IR portal hypertension entered   Left message on machine to call back

## 2022-07-18 ENCOUNTER — Ambulatory Visit (HOSPITAL_COMMUNITY)
Admission: RE | Admit: 2022-07-18 | Discharge: 2022-07-18 | Disposition: A | Discharge status: Home / Self Care | Payer: Medicaid Other | Source: Ambulatory Visit | Attending: Gastroenterology | Admitting: Gastroenterology

## 2022-07-18 DIAGNOSIS — R188 Other ascites: Secondary | ICD-10-CM

## 2022-07-18 HISTORY — PX: IR PARACENTESIS: IMG2679

## 2022-07-18 HISTORY — PX: PARACENTESIS: SHX844

## 2022-07-18 LAB — BODY FLUID CELL COUNT WITH DIFFERENTIAL
Eos, Fluid: 0 %
Lymphs, Fluid: 19 %
Monocyte-Macrophage-Serous Fluid: 67 % (ref 50–90)
Neutrophil Count, Fluid: 14 % (ref 0–25)
Total Nucleated Cell Count, Fluid: 149 cu mm (ref 0–1000)

## 2022-07-18 LAB — GRAM STAIN

## 2022-07-18 LAB — LACTATE DEHYDROGENASE, PLEURAL OR PERITONEAL FLUID: LD, Fluid: 31 U/L — ABNORMAL HIGH (ref 3–23)

## 2022-07-18 LAB — PROTEIN, PLEURAL OR PERITONEAL FLUID: Total protein, fluid: <3 g/dL

## 2022-07-18 LAB — ALBUMIN, PLEURAL OR PERITONEAL FLUID: Albumin, Fluid: <1.5 g/dL

## 2022-07-18 MED ORDER — LIDOCAINE HCL 1 % IJ SOLN
INTRAMUSCULAR | Status: AC
  Administered 2022-07-18: 8 mL
  Filled 2022-07-18: qty 20

## 2022-07-18 NOTE — Procedures (Signed)
PROCEDURE SUMMARY:  Successful US guided diagnostic and therapeutic paracentesis from RLQ.  Yielded 5.3 L of clear, yellow fluid.  No immediate complications.  Pt tolerated well.   Specimen was sent for labs.  EBL < 1 mL  Tyson Alias, AGNP 07/18/2022 1:44 PM

## 2022-07-19 ENCOUNTER — Telehealth: Payer: Self-pay | Admitting: Internal Medicine

## 2022-07-19 NOTE — Telephone Encounter (Signed)
Called and spoke with patient He had over 5 L removed via paracentesis yesterday with interventional Rads. Has good urine output Weight today is 182 lbs, which was the same as when returned home after fluid removed. Decrease Spironolactone to 50 mg and Furosemide 20 mg daily Continue daily weights and increase diuretics if begins gaining water weight again. Has an appt with me on the 4th and will get electrolytes then.

## 2022-07-19 NOTE — Telephone Encounter (Signed)
Pt called to ask if he should continue taking the increased dose of diuretics as intructed for two days or go back to the normal dose.

## 2022-07-19 NOTE — Telephone Encounter (Signed)
Pt did keep appt see results for further reqs

## 2022-07-23 ENCOUNTER — Encounter: Payer: Self-pay | Admitting: Gastroenterology

## 2022-07-23 LAB — CULTURE, BODY FLUID W GRAM STAIN -BOTTLE: Culture: NO GROWTH

## 2022-07-23 LAB — CYTOLOGY - NON PAP

## 2022-07-24 ENCOUNTER — Ambulatory Visit: Payer: Medicaid Other | Admitting: Nurse Practitioner

## 2022-07-24 ENCOUNTER — Other Ambulatory Visit (INDEPENDENT_AMBULATORY_CARE_PROVIDER_SITE_OTHER): Payer: Medicaid Other

## 2022-07-24 ENCOUNTER — Encounter: Payer: Self-pay | Admitting: Nurse Practitioner

## 2022-07-24 VITALS — BP 98/68 | HR 87 | Ht 74.0 in | Wt 186.0 lb

## 2022-07-24 DIAGNOSIS — K7031 Alcoholic cirrhosis of liver with ascites: Secondary | ICD-10-CM

## 2022-07-24 LAB — BASIC METABOLIC PANEL
BUN: 8 mg/dL (ref 6–23)
CO2: 28 mEq/L (ref 19–32)
Calcium: 8.9 mg/dL (ref 8.4–10.5)
Chloride: 102 mEq/L (ref 96–112)
Creatinine, Ser: 0.65 mg/dL (ref 0.40–1.50)
GFR: 109.57 mL/min (ref >60.00)
Glucose, Bld: 88 mg/dL (ref 70–99)
Potassium: 4.7 mEq/L (ref 3.5–5.1)
Sodium: 136 mEq/L (ref 135–145)

## 2022-07-24 LAB — IBC + FERRITIN
Ferritin: 41.1 ng/mL (ref 22.0–322.0)
Iron: 24 ug/dL — ABNORMAL LOW (ref 42–165)
Saturation Ratios: 6.7 % — ABNORMAL LOW (ref 20.0–50.0)
TIBC: 358.4 ug/dL (ref 250.0–450.0)
Transferrin: 256 mg/dL (ref 212.0–360.0)

## 2022-07-24 NOTE — Progress Notes (Signed)
Assessment    Patient profile:  Stephen Miranda is a 52 y.o. male known to Dr. Rush Landmark ( hospital) with a past medical history of alcohol abuse, cirrhosis with history of variceal bleeding, long segment Barrett's esophagus, enlarged prostate. See PMH /PSH for additional history  # Cirrhosis, presumably Etoh related and complicated by ascites , esophageal varices and recent gastric variceal bleeding. He is s/p recent BRTO.  Got 5.3 LVP last week. No SBP  Stable daily weights at home . Recently started Lasix 20 mg and Aldactone 50 mg daily.  He is compliant with 2 g sodium restricted diet.  Hgb improved from 6.8 to 9.3 after pRBC.  Etoh abuse in remission  # Colon cancer screening. He has not undergone prior screening.    # Long segment Barrett's esophagus  # Possible Richter's type hernia   Plan   Obtain complete hepatic serologic workup to evaluate for additional causes of cirrhosis.  No need for repeat paracentesis at this time. Ascites hasn't reaccumulated since LVP on 12/28 .  Continue Lasix 20 mg daily and aldactone 50 mg daily  Continue 2 gram sodium restricted diet Continue daily weights. Contact us if weight is increasing.  He will talk with PCP tomorrow about getting HAV and HBV vaccine ( no immunity based on labs) Continue BID PPI for now. Consider dose reduction when we see him in follow up Will discuss Coreg dose with Dr. Rush Landmark. He is taking only 3.25 mg BID (maybe BP was tenuous?).  He has grade II esophageal varices that were not banded. Need repeat EGD at some point or hope for upward titration of beta blocker at some point.   Follow up with Dr. Rush Landmark in March. Sooner appt if needed.    HPI    Chief complaint: hospital follow up   Stephen Miranda was initially seen by [place] in the hospital back in 2020 for upper GI bleeding at which time he had gastric and duodenal varices, portal gastropathy and possible Barrett's esophagus.  Stephen Miranda was seen again by [place] during a recent  hospital admission for a major upper GI bleeding with hemodynamic compromise.  He was transfused several units of PRBCs and FFP.  Emergent EGD showed Type 2 gastroesophageal varices (GOV2, esophageal varices which extend along the fundus), without active bleeding, but WITH stigmata of recent bleeding.  He underwent a BRTO on 07/04/2022. No Etoh use since hospital discharge on 12/18   07/10/22 ED visit for decreased urine output and abdominal distention. CT scan showed significant ascites. Given dose of lasix.   12/21 - PCP visit.  Started Aldactone 50 mg daily and Lasix 20 mg daily. Sent to LVP as well as US liver doppler which showed largea mount of ascites.  but BRTO was okay. Following doppler US he had 5.3 LVP done. No SBP  Interval History:  Stephen Miranda feels okay.  No further bleeding.  He has not had a reaccumulation of ascites since his LVP last week.  He is weighing himself daily and weight is stable.  He is consuming < /= 2 g of sodium daily.  He is still taking Lasix 20 mg daily and Aldactone 50 mg daily.   Previous GI Evaluation   Oct 2020 EGD -No gross lesions in esophagus. - Non-bleeding grade II and large (> 5 mm) esophageal varices. - Salmon-colored mucosa suggestive of long-segment Barrett's esophagus. - 3 cm hiatal hernia. - Type 1 isolated gastric varices (IGV1, varices located in the fundus), without bleeding. - Portal hypertensive  gastropathy. 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 - Normal mucosa was found in the duodenal bulb, in the first portion of the duodenum, in the second portion of the duodenum and in the third portion of the duodenum.   07/03/22 EGD -Grade II (medium-sized) esophageal varices with no bleeding and no stigmata of recent bleeding. - Esophageal mucosal changes suspicious for long-segment Barrett's esophagus. Biopsy is contraindicated. - 1-2 cm hiatal hernia. - Type 2 gastroesophageal varices (GOV2, esophageal varices which extend along the fundus), without  active bleeding, but WITH stigmata of recent bleeding. This is the source of recent hemodynamically significant GI bleeding. - Normal examined duodenum. - No specimens collected.   Labs:     Latest Ref Rng & Units 07/11/2022   11:42 AM 07/10/2022    3:54 PM 07/08/2022    4:09 PM  CBC  WBC 3.4 - 10.8 x10E3/uL 11.6  11.9    Hemoglobin 13.0 - 17.7 g/dL 9.3  9.2  9.0   Hematocrit 37.5 - 51.0 % 26.6  28.1  26.9   Platelets 150 - 450 x10E3/uL 207  170         Latest Ref Rng & Units 07/11/2022   11:42 AM 07/10/2022    3:54 PM 07/08/2022    4:36 AM  Hepatic Function  Total Protein 6.0 - 8.5 g/dL 5.8  5.9  4.7   Albumin 4.1 - 5.1 g/dL 2.7  3.1  2.2   AST 0 - 40 IU/L 69  59  53   ALT 0 - 44 IU/L _0 Alk Phosphatase 44 - 121 IU/L 84  58  44   Total Bilirubin 0.0 - 1.2 mg/dL 3.0  4.1  3.3      Past Medical History:  Diagnosis Date   Chronic insomnia 05/23/2016   Has stated in past since age 8 yo, but worse in recent years with restless legs.   Elevated liver enzymes 04/24/2015   04/18/2015:  AST:  115     ALT:  90. Likely due to alcohol intake   Malignant melanoma of back ([place]) 02/07/2015   Left upper back:  Dr. Sarajane Jews [facility]   Restless legs 05/23/2016   Seborrheic dermatitis 05/23/2016    Past Surgical History:  Procedure Laterality Date   ESOPHAGOGASTRODUODENOSCOPY N/A 07/03/2022   Procedure: ESOPHAGOGASTRODUODENOSCOPY (EGD);  Surgeon: Jerene Bears, MD;  Location: [facility];  Service: Gastroenterology;  Laterality: N/A;   ESOPHAGOGASTRODUODENOSCOPY (EGD) WITH PROPOFOL N/A 05/20/2019   Procedure: ESOPHAGOGASTRODUODENOSCOPY (EGD) WITH PROPOFOL;  Surgeon: Rush Landmark Telford Nab., MD;  Location: [place];  Service: Gastroenterology;  Laterality: N/A;   Excision Malignant Melanoma Left 02/07/2015   Left upper back   IR ANGIOGRAM SELECTIVE EACH ADDITIONAL VESSEL  07/04/2022   IR EMBO ART  VEN HEMORR LYMPH EXTRAV  INC GUIDE ROADMAPPING   07/04/2022   IR PARACENTESIS  07/18/2022   IR US GUIDE VASC ACCESS RIGHT  07/04/2022   IR VENOGRAM RENAL UNI LEFT  07/04/2022   PARACENTESIS  07/18/2022   RADIOLOGY WITH ANESTHESIA N/A 07/04/2022   Procedure: IR WITH ANESTHESIA - TIPS;  Surgeon: Radiologist, Medication, MD;  Location: [place];  Service: Radiology;  Laterality: N/A;    Current Medications, Allergies, Family History and Social History were reviewed in [employer] record.     Current Outpatient Medications  Medication Sig Dispense Refill   carvedilol (COREG) 3.125 MG tablet Take 1 tablet (3.125 mg total) by mouth 2 (two) times  daily with a meal. 60 tablet 11   furosemide (LASIX) 20 MG tablet 1 tab by mouth daily in morning with Spironolactone 30 tablet 11   Multiple Vitamin (MULTIVITAMIN WITH MINERALS) TABS tablet Take 1 tablet by mouth daily. 30 tablet 3   pantoprazole (PROTONIX) 40 MG tablet Take 1 tablet (40 mg total) by mouth 2 (two) times daily. 60 tablet 11   spironolactone (ALDACTONE) 50 MG tablet 1 tab by mouth in morning with furosemide 30 tablet 11   tamsulosin (FLOMAX) 0.4 MG CAPS capsule Take 1 capsule (0.4 mg total) by mouth daily after supper. 30 capsule 11   No current facility-administered medications for this visit.    Review of Systems: No chest pain. No shortness of breath. No urinary complaints.    Physical Exam  Wt Readings from Last 3 Encounters:  07/24/22 186 lb (84.4 kg)  07/11/22 208 lb (94.3 kg)  07/10/22 206 lb 9.1 oz (93.7 kg)    BP 98/68   Pulse 87   Ht _0  (1.88 m)   Wt 186 lb (84.4 kg)   SpO2 98%   BMI 23.88 kg/m  Constitutional:  Chronically ill appearing male in no acute distress. Psychiatric: Pleasant. Normal mood and affect. Behavior is normal. EENT: Pupils normal.  Conjunctivae are normal.  Neck supple.  Cardiovascular: Normal rate, regular rhythm.  Pulmonary/chest: Effort normal and breath sounds normal. No wheezing, rales or rhonchi. Abdominal:  Soft, nondistended, nontender. Bowel sounds active throughout. There are no masses palpable. No hepatomegaly. Neurological: Alert and oriented to person place and time. No asterixis.  Musculoskeletal: No edema Skin: Skin is warm and dry. No rashes noted.  Tye Savoy, NP  07/24/2022, 1:34 PM  Cc:  Mack Hook, MD

## 2022-07-24 NOTE — Patient Instructions (Addendum)
  Your provider has requested that you go to the basement level for lab work before leaving today. Press "B" on the elevator. The lab is located at the first door on the left as you exit the elevator.   You have been scheduled for an appointment with Dr. Rush Landmark on 09/26/22 at 1:30 am. Please arrive 10 minutes early for your appointment.   Continue current medication.  Continue 2 gram sodium diet.  Continue weighing yourself daily.  Talk with your primary doctor about getting Hepatitis A and Hepatitis B vaccines.   If you are age 51 or younger, your body mass index should be between 19-25. Your Body mass index is 23.88 kg/m. If this is out of the aformentioned range listed, please consider follow up with your Primary Care Provider.   __________________________________________________________  The [place] [facility] providers would like to encourage you to use [facility] to communicate with providers for non-urgent requests or questions.  Due to long hold times on the telephone, sending your provider a message by [facility] may be a faster and more efficient way to get a response.  Please allow 48 business hours for a response.  Please remember that this is for non-urgent requests.   Due to recent changes in healthcare laws, you may see the results of your imaging and laboratory studies on MyChart before your provider has had a chance to review them.  We understand that in some cases there may be results that are confusing or concerning to you. Not all laboratory results come back in the same time frame and the provider may be waiting for multiple results in order to interpret others.  Please give [place] 48 hours in order for your provider to thoroughly review all the results before contacting the office for clarification of your results.     Thank you for choosing me and [facility].  Tye Savoy, NP.

## 2022-07-25 ENCOUNTER — Ambulatory Visit (INDEPENDENT_AMBULATORY_CARE_PROVIDER_SITE_OTHER): Payer: Self-pay | Admitting: Internal Medicine

## 2022-07-25 VITALS — BP 108/60 | HR 88 | Resp 16 | Ht 74.0 in | Wt 183.0 lb

## 2022-07-25 DIAGNOSIS — I864 Gastric varices: Secondary | ICD-10-CM

## 2022-07-25 DIAGNOSIS — Z23 Encounter for immunization: Secondary | ICD-10-CM

## 2022-07-25 DIAGNOSIS — I8500 Esophageal varices without bleeding: Secondary | ICD-10-CM

## 2022-07-25 DIAGNOSIS — K7031 Alcoholic cirrhosis of liver with ascites: Secondary | ICD-10-CM

## 2022-07-25 MED ORDER — CARVEDILOL 3.125 MG PO TABS: ORAL_TABLET | ORAL | 11 refills | Status: DC

## 2022-07-25 NOTE — Progress Notes (Signed)
    Subjective:    Patient ID: Stephen Miranda, male   DOB: 01/05/1971, 52 y.o.   MRN: [id number]   HPI   Cirrhosis with ascites:  Currently taking 50 mg of spironolactone and 20 of Furosemide.  Was on twice those doses for 2 days prior to paracentesis.  Abdominal girth has been stable at 41" and weight stable at 181 lbs.  Still feels some abdominal pressure.  Minimal edema of legs--much better.    2.  Esophageal varices:  Has just been compliant with use of Carvedilol from what I can tell last visit end of December.  Tolerating well.  HR at 88 at rest.    2.  Non immune to Hep A and B:  vaccination initiated back in August with visit.  Needs 2nd Hep B.    Current Meds  Medication Sig   carvedilol (COREG) 3.125 MG tablet Take 1 tablet (3.125 mg total) by mouth 2 (two) times daily with a meal.   furosemide (LASIX) 20 MG tablet 1 tab by mouth daily in morning with Spironolactone   Multiple Vitamin (MULTIVITAMIN WITH MINERALS) TABS tablet Take 1 tablet by mouth daily.   pantoprazole (PROTONIX) 40 MG tablet Take 1 tablet (40 mg total) by mouth 2 (two) times daily.   spironolactone (ALDACTONE) 50 MG tablet 1 tab by mouth in morning with furosemide   tamsulosin (FLOMAX) 0.4 MG CAPS capsule Take 1 capsule (0.4 mg total) by mouth daily after supper.   No Known Allergies   Review of Systems    Objective:   BP 108/60 (BP Location: Right Arm, Patient Position: Sitting, Cuff Size: Normal)   Pulse 88   Resp 16   Ht 6\' 2"  (1.88 m)   Wt 183 lb (83 kg)   BMI 23.50 kg/m   Physical Exam Chronically ill appearing.  Pasty coloring. Lungs:  CTA.  Spider angiomata of upper chest/neck CV:  RRR without murmur or rub.  Radial and DP pulses normal and equal Abd:  S, Mild shifting dullness on percussion.  + BS, NT   Assessment & Plan    Cirrhosis with ascites:  Improved fluid status  2.  Esophageal varices:  increase carvedilol to 6.25 mg twice daily.  Follow up in 2-3 weeks to see if tolerating.     3.  Hep B non immune:  2nd Hep B today.    Return in 2 months for last Hep A and B vaccination.  4.  HM:  COVID vaccination when returns.

## 2022-07-26 NOTE — Progress Notes (Signed)
Attending Physician's Attestation   I have reviewed the chart.   I agree with the Advanced Practitioner's note, impression, and recommendations with any updates as below. If the patient has blood pressure room I certainly agree that increasing his Coreg makes sense.  Will need to monitor and see how his weights do but if his weights are continuing to increase we will have to be more aggressive with diuretics moving forward as well as periodic LVP's.   Justice Britain, MD [facility] Office # [phone]

## 2022-07-27 LAB — ANTI-NUCLEAR AB-TITER (ANA TITER): ANA Titer 1: 1:40 {titer} — ABNORMAL HIGH

## 2022-07-27 LAB — IGA: Immunoglobulin A: 684 mg/dL — ABNORMAL HIGH (ref 47–310)

## 2022-07-27 LAB — ANTI-SMOOTH MUSCLE ANTIBODY, IGG: Actin (Smooth Muscle) Antibody (IGG): 22 U — ABNORMAL HIGH (ref <20)

## 2022-07-27 LAB — MITOCHONDRIAL ANTIBODIES: Mitochondrial M2 Ab, IgG: <=20 U (ref <=20.0)

## 2022-07-27 LAB — ANA: Anti Nuclear Antibody (ANA): POSITIVE — AB

## 2022-07-27 LAB — TISSUE TRANSGLUTAMINASE, IGA: (tTG) Ab, IgA: 1.3 U/mL

## 2022-07-27 LAB — CERULOPLASMIN: Ceruloplasmin: 30 mg/dL (ref 18–36)

## 2022-07-27 LAB — AFP TUMOR MARKER: AFP-Tumor Marker: 5.1 ng/mL (ref <6.1)

## 2022-07-27 LAB — ALPHA-1-ANTITRYPSIN: A-1 Antitrypsin, Ser: 192 mg/dL (ref 83–199)

## 2022-07-30 ENCOUNTER — Inpatient Hospital Stay: Admission: RE | Admit: 2022-07-30 | Payer: Self-pay | Source: Ambulatory Visit

## 2022-08-01 ENCOUNTER — Other Ambulatory Visit: Payer: Self-pay

## 2022-08-16 ENCOUNTER — Other Ambulatory Visit: Payer: Self-pay

## 2022-08-16 MED ORDER — CARVEDILOL 6.25 MG PO TABS: ORAL_TABLET | ORAL | 11 refills | Status: DC

## 2022-08-17 ENCOUNTER — Other Ambulatory Visit: Payer: Self-pay | Admitting: Internal Medicine

## 2022-08-19 ENCOUNTER — Ambulatory Visit: Payer: Self-pay | Admitting: Internal Medicine

## 2022-08-19 ENCOUNTER — Encounter: Payer: Self-pay | Admitting: Internal Medicine

## 2022-08-19 VITALS — BP 94/52 | HR 68 | Resp 16 | Ht 74.0 in | Wt 174.0 lb

## 2022-08-19 DIAGNOSIS — I8500 Esophageal varices without bleeding: Secondary | ICD-10-CM

## 2022-08-19 DIAGNOSIS — Z23 Encounter for immunization: Secondary | ICD-10-CM

## 2022-08-19 DIAGNOSIS — I864 Gastric varices: Secondary | ICD-10-CM

## 2022-08-19 DIAGNOSIS — K7031 Alcoholic cirrhosis of liver with ascites: Secondary | ICD-10-CM

## 2022-08-19 DIAGNOSIS — D539 Nutritional anemia, unspecified: Secondary | ICD-10-CM

## 2022-08-19 MED ORDER — CARVEDILOL 3.125 MG PO TABS: ORAL_TABLET | ORAL | 3 refills | Status: DC

## 2022-08-19 MED ORDER — CARVEDILOL 6.25 MG PO TABS: ORAL_TABLET | ORAL | 11 refills | Status: DC

## 2022-08-19 NOTE — Progress Notes (Signed)
    Subjective:    Patient ID: Stephen Miranda, male   DOB: Sep 18, 1970, 52 y.o.   MRN: [id number]   HPI   Cirrhosis with ascites and varices:   With the increase in Carvedilol, he does feel at times when standing, everything will go black.  Has also noted significantly loss of energy. He is not having any issues with ascites/fluid retention at this time.  Appetite is okay.  No spontaneous bleeding.  No falls.    2.  Has not had influenza nor COVID booster this season.  Received 2nd Hep B last visit.     Current Meds  Medication Sig   carvedilol (COREG) 3.125 MG tablet 1 1/2 tabs by mouth twice daily.   furosemide (LASIX) 20 MG tablet 1 tab by mouth daily in morning with Spironolactone   Multiple Vitamin (MULTIVITAMIN WITH MINERALS) TABS tablet Take 1 tablet by mouth daily.   pantoprazole (PROTONIX) 40 MG tablet Take 1 tablet (40 mg total) by mouth 2 (two) times daily.   spironolactone (ALDACTONE) 50 MG tablet 1 tab by mouth in morning with furosemide   tamsulosin (FLOMAX) 0.4 MG CAPS capsule Take 1 capsule (0.4 mg total) by mouth daily after supper.   [DISCONTINUED] carvedilol (COREG) 6.25 MG tablet 1 tabs by mouth twice daily (Patient taking differently: 1 tabs by mouth twice daily. Currently still taking the 3.'125mg'$  dose 2 tabs twice daily)   No Known Allergies   Review of Systems    Objective:   BP (!) 94/52 (BP Location: Right Arm, Patient Position: Sitting, Cuff Size: Normal)   Pulse 68   Resp 16   Ht '6\' 2"'$  (1.88 m)   Wt 174 lb (78.9 kg)   BMI 22.34 kg/m   Physical Exam NAD Chronically ill appearing and pale Lungs:  CTA CV:  RRR without murmur or rub.  Radial and DP pulses normal and equal Abd:  S, NT, No fluid wave noted.  No HSM or mass LE:  knee high compression stockings and no edema   Assessment & Plan   Cirrhosis with ascites and esophageal/gastric varices.  Symptomatic with low blood pressure and pulse.  Decrease Carvedilol to 3/125 mg in the morning and 6.25 mg  at night.  To take time to gradually sit and then stand up.  Call if symtoms persist.  BP and pulse check in 2 weeks.  2.  Iron deficiency anemia after UGI bleed:  Not taking iron.  CBC today and start iron if still anemic.  Eating spinach daily.  3.  HM:  encouraged influenza vaccine at pharmacy or PHD.  Spikevax today.

## 2022-08-20 LAB — CBC WITH DIFFERENTIAL/PLATELET
Basophils Absolute: 0 10*3/uL (ref 0.0–0.2)
Basos: 1 %
EOS (ABSOLUTE): 0.1 10*3/uL (ref 0.0–0.4)
Eos: 4 %
Hematocrit: 26.8 % — ABNORMAL LOW (ref 37.5–51.0)
Hemoglobin: 9.1 g/dL — ABNORMAL LOW (ref 13.0–17.7)
Immature Grans (Abs): 0 10*3/uL (ref 0.0–0.1)
Immature Granulocytes: 0 %
Lymphocytes Absolute: 0.8 10*3/uL (ref 0.7–3.1)
Lymphs: 25 %
MCH: 29.8 pg (ref 26.6–33.0)
MCHC: 34 g/dL (ref 31.5–35.7)
MCV: 88 fL (ref 79–97)
Monocytes Absolute: 0.4 10*3/uL (ref 0.1–0.9)
Monocytes: 11 %
Neutrophils Absolute: 2 10*3/uL (ref 1.4–7.0)
Neutrophils: 59 %
Platelets: 104 10*3/uL — ABNORMAL LOW (ref 150–450)
RBC: 3.05 x10E6/uL — ABNORMAL LOW (ref 4.14–5.80)
RDW: 13.9 % (ref 11.6–15.4)
WBC: 3.4 10*3/uL (ref 3.4–10.8)

## 2022-08-20 LAB — BASIC METABOLIC PANEL
BUN/Creatinine Ratio: 12 (ref 9–20)
BUN: 9 mg/dL (ref 6–24)
CO2: 23 mmol/L (ref 20–29)
Calcium: 9.1 mg/dL (ref 8.7–10.2)
Chloride: 103 mmol/L (ref 96–106)
Creatinine, Ser: 0.78 mg/dL (ref 0.76–1.27)
Glucose: 97 mg/dL (ref 70–99)
Potassium: 4.2 mmol/L (ref 3.5–5.2)
Sodium: 138 mmol/L (ref 134–144)
eGFR: 109 mL/min/{1.73_m2} (ref >59)

## 2022-09-24 ENCOUNTER — Ambulatory Visit: Payer: Medicaid Other | Admitting: Internal Medicine

## 2022-09-24 ENCOUNTER — Ambulatory Visit (INDEPENDENT_AMBULATORY_CARE_PROVIDER_SITE_OTHER): Payer: Medicaid Other | Admitting: Internal Medicine

## 2022-09-24 DIAGNOSIS — Z23 Encounter for immunization: Secondary | ICD-10-CM | POA: Diagnosis not present

## 2022-09-26 ENCOUNTER — Ambulatory Visit: Payer: Medicaid Other | Admitting: Gastroenterology

## 2022-10-11 ENCOUNTER — Encounter: Payer: Self-pay | Admitting: Internal Medicine

## 2022-10-17 ENCOUNTER — Other Ambulatory Visit (HOSPITAL_COMMUNITY): Payer: Self-pay | Admitting: Interventional Radiology

## 2022-10-17 DIAGNOSIS — I8500 Esophageal varices without bleeding: Secondary | ICD-10-CM

## 2022-10-17 DIAGNOSIS — I864 Gastric varices: Secondary | ICD-10-CM

## 2022-11-18 ENCOUNTER — Encounter: Payer: Self-pay | Admitting: Internal Medicine

## 2022-11-18 ENCOUNTER — Ambulatory Visit: Payer: Medicaid Other | Admitting: Internal Medicine

## 2022-11-18 VITALS — BP 110/60 | HR 64 | Resp 16 | Ht 74.0 in | Wt 189.0 lb

## 2022-11-18 DIAGNOSIS — K7031 Alcoholic cirrhosis of liver with ascites: Secondary | ICD-10-CM

## 2022-11-18 DIAGNOSIS — D539 Nutritional anemia, unspecified: Secondary | ICD-10-CM | POA: Diagnosis not present

## 2022-11-18 DIAGNOSIS — R5383 Other fatigue: Secondary | ICD-10-CM | POA: Diagnosis not present

## 2022-11-18 DIAGNOSIS — L84 Corns and callosities: Secondary | ICD-10-CM

## 2022-11-18 DIAGNOSIS — F32A Depression, unspecified: Secondary | ICD-10-CM | POA: Diagnosis not present

## 2022-11-18 MED ORDER — ESCITALOPRAM OXALATE 5 MG PO TABS: 5.0000 mg | ORAL_TABLET | Freq: Every day | ORAL | 3 refills | Status: DC

## 2022-11-18 NOTE — Progress Notes (Signed)
    Subjective:    Patient ID: Stephen Miranda, male   DOB: 09-Dec-1970, 52 y.o.   MRN: [id number]   HPI   Fatigue:  no longer with light headedness when standing after decrease of morning Carvedilol, but fatigued all the time.  Feels he is sleeping okay.  Gets 6 hours at night and dozes off during the day.  Not necessarily refreshed in the morning.  His sense is that he has no energy and difficulty with concentration.   He does have anemia and was finally able to find the Ferrous gluconate once daily.   He feels bored, no interest or focus in doing things he used to like to do.   Spends time during day listening to podcasts and napping.   Has been on Prozac Has had ;ania when in high school, but has been on an  antidepressant without mania since.    2.  Pain on foot since October.  Tough callous spot.  Hurts.  3.  Sore nipples: Not sure if breast tissue   Current Meds  Medication Sig   carvedilol (COREG) 3.125 MG tablet 1 tab by mouth in morning and 2 tabs by mouth in evening.   ferrous gluconate (FERGON) 324 MG tablet Take 324 mg by mouth daily with breakfast.   furosemide (LASIX) 20 MG tablet 1 tab by mouth daily in morning with Spironolactone   Multiple Vitamin (MULTIVITAMIN WITH MINERALS) TABS tablet Take 1 tablet by mouth daily.   pantoprazole (PROTONIX) 40 MG tablet Take 1 tablet (40 mg total) by mouth 2 (two) times daily.   spironolactone (ALDACTONE) 50 MG tablet 1 tab by mouth in morning with furosemide   tamsulosin (FLOMAX) 0.4 MG CAPS capsule Take 1 capsule (0.4 mg total) by mouth daily after supper.   No Known Allergies   Review of Systems    Objective:   BP 110/60 (BP Location: Right Arm, Patient Position: Sitting, Cuff Size: Normal)   Pulse 64   Resp 16   Ht 6\' 2"  (1.88 m)   Wt 189 lb (85.7 kg)   BMI 24.27 kg/m   Physical Exam NAD HEENT:  PERRL, EOMI Neck: Supple, No adenopathy, no thyromegaly Chest:  CTA CV: RRR without murmur or rub.  Radial and DP pulses  normal and equal Abd: S, NT, No HSM or mass, + BS.  No shifting dullness.   LE:  Mild to moderate pitting edema Feet:  Callus head of MCP 4th ray.  Also at medial great toe base.    Assessment & Plan  Fatigue:  likely due to his chronic health issues secondary to alcohol use disorder as well as need for beta blocker with esophageal varices and recent hx UGI bleed.  CBC, CMP, TSH ordered, but unable to obtain lab specimen.  Will have him either return or obtain at oupatient [facility] facility.  2.  Depression:  start Escitalopram 5 mg daily and watch for signs of hypomania/mania.  Follow up in 2 weeks.  3.  Callus of feet:  referral to podiatry.  4.  Peripheral edema:  2g sodium intake.

## 2022-12-07 ENCOUNTER — Other Ambulatory Visit: Payer: Self-pay | Admitting: Internal Medicine

## 2022-12-26 ENCOUNTER — Encounter (HOSPITAL_COMMUNITY): Payer: Self-pay

## 2022-12-26 ENCOUNTER — Encounter (HOSPITAL_COMMUNITY): Payer: Self-pay | Admitting: Anesthesiology

## 2022-12-26 ENCOUNTER — Other Ambulatory Visit: Payer: Self-pay

## 2022-12-26 ENCOUNTER — Encounter (HOSPITAL_COMMUNITY)
Admission: EM | Disposition: A | Discharge status: Home / Self Care | Payer: Self-pay | Source: Home / Self Care | Attending: Internal Medicine

## 2022-12-26 ENCOUNTER — Inpatient Hospital Stay (HOSPITAL_COMMUNITY)
Admission: EM | Admit: 2022-12-26 | Discharge: 2023-01-04 | DRG: 405 | Disposition: A | Discharge status: Home / Self Care | Payer: Medicaid Other | Attending: Internal Medicine | Admitting: Internal Medicine

## 2022-12-26 ENCOUNTER — Inpatient Hospital Stay (HOSPITAL_COMMUNITY): Payer: Medicaid Other

## 2022-12-26 DIAGNOSIS — K7030 Alcoholic cirrhosis of liver without ascites: Secondary | ICD-10-CM | POA: Diagnosis not present

## 2022-12-26 DIAGNOSIS — Z79899 Other long term (current) drug therapy: Secondary | ICD-10-CM

## 2022-12-26 DIAGNOSIS — D696 Thrombocytopenia, unspecified: Secondary | ICD-10-CM | POA: Diagnosis not present

## 2022-12-26 DIAGNOSIS — F1010 Alcohol abuse, uncomplicated: Secondary | ICD-10-CM | POA: Diagnosis present

## 2022-12-26 DIAGNOSIS — D6959 Other secondary thrombocytopenia: Secondary | ICD-10-CM | POA: Diagnosis present

## 2022-12-26 DIAGNOSIS — R519 Headache, unspecified: Secondary | ICD-10-CM | POA: Diagnosis not present

## 2022-12-26 DIAGNOSIS — I8511 Secondary esophageal varices with bleeding: Secondary | ICD-10-CM | POA: Diagnosis present

## 2022-12-26 DIAGNOSIS — G2581 Restless legs syndrome: Secondary | ICD-10-CM | POA: Diagnosis present

## 2022-12-26 DIAGNOSIS — I8501 Esophageal varices with bleeding: Secondary | ICD-10-CM | POA: Diagnosis not present

## 2022-12-26 DIAGNOSIS — R944 Abnormal results of kidney function studies: Secondary | ICD-10-CM | POA: Diagnosis present

## 2022-12-26 DIAGNOSIS — K920 Hematemesis: Secondary | ICD-10-CM | POA: Diagnosis not present

## 2022-12-26 DIAGNOSIS — K7460 Unspecified cirrhosis of liver: Secondary | ICD-10-CM | POA: Diagnosis not present

## 2022-12-26 DIAGNOSIS — R739 Hyperglycemia, unspecified: Secondary | ICD-10-CM | POA: Diagnosis present

## 2022-12-26 DIAGNOSIS — Z781 Physical restraint status: Secondary | ICD-10-CM

## 2022-12-26 DIAGNOSIS — K449 Diaphragmatic hernia without obstruction or gangrene: Secondary | ICD-10-CM | POA: Diagnosis present

## 2022-12-26 DIAGNOSIS — K317 Polyp of stomach and duodenum: Secondary | ICD-10-CM | POA: Diagnosis present

## 2022-12-26 DIAGNOSIS — K766 Portal hypertension: Secondary | ICD-10-CM | POA: Diagnosis present

## 2022-12-26 DIAGNOSIS — E876 Hypokalemia: Secondary | ICD-10-CM | POA: Diagnosis present

## 2022-12-26 DIAGNOSIS — L309 Dermatitis, unspecified: Secondary | ICD-10-CM | POA: Diagnosis present

## 2022-12-26 DIAGNOSIS — Z978 Presence of other specified devices: Secondary | ICD-10-CM

## 2022-12-26 DIAGNOSIS — R578 Other shock: Secondary | ICD-10-CM | POA: Diagnosis present

## 2022-12-26 DIAGNOSIS — D684 Acquired coagulation factor deficiency: Secondary | ICD-10-CM | POA: Diagnosis present

## 2022-12-26 DIAGNOSIS — K922 Gastrointestinal hemorrhage, unspecified: Secondary | ICD-10-CM | POA: Diagnosis present

## 2022-12-26 DIAGNOSIS — I10 Essential (primary) hypertension: Secondary | ICD-10-CM | POA: Diagnosis not present

## 2022-12-26 DIAGNOSIS — K7031 Alcoholic cirrhosis of liver with ascites: Secondary | ICD-10-CM | POA: Diagnosis present

## 2022-12-26 DIAGNOSIS — Z85820 Personal history of malignant melanoma of skin: Secondary | ICD-10-CM | POA: Diagnosis not present

## 2022-12-26 DIAGNOSIS — R001 Bradycardia, unspecified: Secondary | ICD-10-CM | POA: Diagnosis present

## 2022-12-26 DIAGNOSIS — J9690 Respiratory failure, unspecified, unspecified whether with hypoxia or hypercapnia: Secondary | ICD-10-CM | POA: Diagnosis present

## 2022-12-26 DIAGNOSIS — F17210 Nicotine dependence, cigarettes, uncomplicated: Secondary | ICD-10-CM | POA: Diagnosis present

## 2022-12-26 DIAGNOSIS — K921 Melena: Secondary | ICD-10-CM

## 2022-12-26 DIAGNOSIS — I472 Ventricular tachycardia, unspecified: Secondary | ICD-10-CM | POA: Diagnosis not present

## 2022-12-26 DIAGNOSIS — I340 Nonrheumatic mitral (valve) insufficiency: Secondary | ICD-10-CM | POA: Diagnosis not present

## 2022-12-26 DIAGNOSIS — D62 Acute posthemorrhagic anemia: Secondary | ICD-10-CM | POA: Diagnosis present

## 2022-12-26 DIAGNOSIS — I8510 Secondary esophageal varices without bleeding: Secondary | ICD-10-CM | POA: Diagnosis not present

## 2022-12-26 DIAGNOSIS — E8809 Other disorders of plasma-protein metabolism, not elsewhere classified: Secondary | ICD-10-CM | POA: Diagnosis present

## 2022-12-26 HISTORY — PX: ESOPHAGEAL BANDING: SHX5518

## 2022-12-26 HISTORY — PX: ESOPHAGOGASTRODUODENOSCOPY (EGD) WITH PROPOFOL: SHX5813

## 2022-12-26 LAB — COMPREHENSIVE METABOLIC PANEL
ALT: 20 U/L (ref 0–44)
AST: 32 U/L (ref 15–41)
Albumin: 2.7 g/dL — ABNORMAL LOW (ref 3.5–5.0)
Alkaline Phosphatase: 58 U/L (ref 38–126)
Anion gap: 9 (ref 5–15)
BUN: 28 mg/dL — ABNORMAL HIGH (ref 6–20)
CO2: 20 mmol/L — ABNORMAL LOW (ref 22–32)
Calcium: 8.3 mg/dL — ABNORMAL LOW (ref 8.9–10.3)
Chloride: 111 mmol/L (ref 98–111)
Creatinine, Ser: 0.73 mg/dL (ref 0.61–1.24)
GFR, Estimated: >60 mL/min (ref >60)
Glucose, Bld: 117 mg/dL — ABNORMAL HIGH (ref 70–99)
Potassium: 4.2 mmol/L (ref 3.5–5.1)
Sodium: 140 mmol/L (ref 135–145)
Total Bilirubin: 1.1 mg/dL (ref 0.3–1.2)
Total Protein: 5.5 g/dL — ABNORMAL LOW (ref 6.5–8.1)

## 2022-12-26 LAB — CBC WITH DIFFERENTIAL/PLATELET
Abs Immature Granulocytes: 0.02 10*3/uL (ref 0.00–0.07)
Basophils Absolute: 0.1 10*3/uL (ref 0.0–0.1)
Basophils Relative: 1 %
Eosinophils Absolute: 0 10*3/uL (ref 0.0–0.5)
Eosinophils Relative: 0 %
HCT: 37.2 % — ABNORMAL LOW (ref 39.0–52.0)
Hemoglobin: 11.9 g/dL — ABNORMAL LOW (ref 13.0–17.0)
Immature Granulocytes: 0 %
Lymphocytes Relative: 22 %
Lymphs Abs: 1.6 10*3/uL (ref 0.7–4.0)
MCH: 30.7 pg (ref 26.0–34.0)
MCHC: 32 g/dL (ref 30.0–36.0)
MCV: 95.9 fL (ref 80.0–100.0)
Monocytes Absolute: 0.5 10*3/uL (ref 0.1–1.0)
Monocytes Relative: 8 %
Neutro Abs: 5 10*3/uL (ref 1.7–7.7)
Neutrophils Relative %: 69 %
Platelets: 113 10*3/uL — ABNORMAL LOW (ref 150–400)
RBC: 3.88 MIL/uL — ABNORMAL LOW (ref 4.22–5.81)
RDW: 16.1 % — ABNORMAL HIGH (ref 11.5–15.5)
WBC: 7.2 10*3/uL (ref 4.0–10.5)
nRBC: 0 % (ref 0.0–0.2)

## 2022-12-26 LAB — PROTIME-INR
INR: 1.5 — ABNORMAL HIGH (ref 0.8–1.2)
Prothrombin Time: 18.2 seconds — ABNORMAL HIGH (ref 11.4–15.2)

## 2022-12-26 LAB — TYPE AND SCREEN: ABO/RH(D): O POS

## 2022-12-26 LAB — URINALYSIS, ROUTINE W REFLEX MICROSCOPIC
Bilirubin Urine: NEGATIVE
Glucose, UA: NEGATIVE mg/dL
Hgb urine dipstick: NEGATIVE
Ketones, ur: 5 mg/dL — AB
Leukocytes,Ua: NEGATIVE
Nitrite: NEGATIVE
Protein, ur: NEGATIVE mg/dL
Specific Gravity, Urine: 1.019 (ref 1.005–1.030)
pH: 6 (ref 5.0–8.0)

## 2022-12-26 LAB — BPAM RBC: ISSUE DATE / TIME: 202406061954

## 2022-12-26 LAB — PREPARE RBC (CROSSMATCH)

## 2022-12-26 LAB — LIPASE, BLOOD: Lipase: 28 U/L (ref 11–51)

## 2022-12-26 SURGERY — ESOPHAGOGASTRODUODENOSCOPY (EGD) WITH PROPOFOL
Anesthesia: Monitor Anesthesia Care

## 2022-12-26 MED ORDER — SODIUM CHLORIDE 0.9 % IV SOLN
1.0000 g | INTRAVENOUS | Status: AC
  Administered 2022-12-27 – 2022-12-31 (×5): 1 g via INTRAVENOUS
  Filled 2022-12-26 (×7): qty 10

## 2022-12-26 MED ORDER — ATROPINE SULFATE 1 MG/10ML IJ SOSY
PREFILLED_SYRINGE | INTRAMUSCULAR | Status: AC
  Filled 2022-12-26: qty 10

## 2022-12-26 MED ORDER — PHENYLEPHRINE 80 MCG/ML (10ML) SYRINGE FOR IV PUSH (FOR BLOOD PRESSURE SUPPORT)
PREFILLED_SYRINGE | INTRAVENOUS | Status: AC
  Filled 2022-12-26: qty 10

## 2022-12-26 MED ORDER — POLYETHYLENE GLYCOL 3350 17 G PO PACK: 17.0000 g | PACK | Freq: Every day | ORAL | Status: DC | PRN

## 2022-12-26 MED ORDER — ONDANSETRON HCL 4 MG/2ML IJ SOLN
4.0000 mg | Freq: Four times a day (QID) | INTRAMUSCULAR | Status: DC | PRN
  Administered 2022-12-27: 4 mg via INTRAVENOUS
  Filled 2022-12-26: qty 2

## 2022-12-26 MED ORDER — FENTANYL CITRATE PF 50 MCG/ML IJ SOSY
50.0000 ug | PREFILLED_SYRINGE | INTRAMUSCULAR | Status: DC | PRN
  Administered 2022-12-26 – 2022-12-27 (×2): 50 ug via INTRAVENOUS
  Filled 2022-12-26 (×2): qty 1

## 2022-12-26 MED ORDER — LACTATED RINGERS IV BOLUS: 1000.0000 mL | Freq: Once | INTRAVENOUS | Status: DC

## 2022-12-26 MED ORDER — PANTOPRAZOLE 80MG IVPB - SIMPLE MED
80.0000 mg | Freq: Once | INTRAVENOUS | Status: AC
  Administered 2022-12-26: 80 mg via INTRAVENOUS
  Filled 2022-12-26: qty 100

## 2022-12-26 MED ORDER — PANTOPRAZOLE SODIUM 40 MG IV SOLR
40.0000 mg | Freq: Once | INTRAVENOUS | Status: AC
  Administered 2022-12-26: 40 mg via INTRAVENOUS
  Filled 2022-12-26: qty 10

## 2022-12-26 MED ORDER — SODIUM CHLORIDE 0.9% IV SOLUTION: Freq: Once | INTRAVENOUS | Status: DC

## 2022-12-26 MED ORDER — SODIUM CHLORIDE 0.9 % IV SOLN
250.0000 mL | INTRAVENOUS | Status: DC
  Administered 2022-12-27: 1000 mL via INTRAVENOUS
  Administered 2022-12-27: 250 mL via INTRAVENOUS

## 2022-12-26 MED ORDER — PANTOPRAZOLE INFUSION (NEW) - SIMPLE MED
8.0000 mg/h | INTRAVENOUS | Status: DC
  Administered 2022-12-26 – 2022-12-27 (×2): 8 mg/h via INTRAVENOUS
  Filled 2022-12-26 (×3): qty 100

## 2022-12-26 MED ORDER — FENTANYL CITRATE PF 50 MCG/ML IJ SOSY
PREFILLED_SYRINGE | INTRAMUSCULAR | Status: AC
  Filled 2022-12-26: qty 2

## 2022-12-26 MED ORDER — PHENYLEPHRINE 80 MCG/ML (10ML) SYRINGE FOR IV PUSH (FOR BLOOD PRESSURE SUPPORT): 200.0000 ug | PREFILLED_SYRINGE | Freq: Once | INTRAVENOUS | Status: DC | PRN

## 2022-12-26 MED ORDER — LACTATED RINGERS IV BOLUS
1000.0000 mL | Freq: Once | INTRAVENOUS | Status: AC
  Administered 2022-12-26: 1000 mL via INTRAVENOUS

## 2022-12-26 MED ORDER — MIDAZOLAM HCL 2 MG/2ML IJ SOLN
INTRAMUSCULAR | Status: AC
  Administered 2022-12-26: 2 mg
  Filled 2022-12-26: qty 2

## 2022-12-26 MED ORDER — ETOMIDATE 2 MG/ML IV SOLN
INTRAVENOUS | Status: AC
  Administered 2022-12-26: 20 mg
  Filled 2022-12-26: qty 20

## 2022-12-26 MED ORDER — INSULIN ASPART 100 UNIT/ML IJ SOLN
0.0000 [IU] | INTRAMUSCULAR | Status: DC
  Administered 2022-12-27: 2 [IU] via SUBCUTANEOUS

## 2022-12-26 MED ORDER — DOCUSATE SODIUM 100 MG PO CAPS: 100.0000 mg | ORAL_CAPSULE | Freq: Two times a day (BID) | ORAL | Status: DC | PRN

## 2022-12-26 MED ORDER — NOREPINEPHRINE 4 MG/250ML-% IV SOLN
INTRAVENOUS | Status: AC
  Filled 2022-12-26: qty 250

## 2022-12-26 MED ORDER — ONDANSETRON HCL 4 MG/2ML IJ SOLN
4.0000 mg | Freq: Once | INTRAMUSCULAR | Status: AC
  Administered 2022-12-26: 4 mg via INTRAVENOUS
  Filled 2022-12-26: qty 2

## 2022-12-26 MED ORDER — OCTREOTIDE LOAD VIA INFUSION
50.0000 ug | Freq: Once | INTRAVENOUS | Status: AC
  Administered 2022-12-26: 50 ug via INTRAVENOUS
  Filled 2022-12-26: qty 25

## 2022-12-26 MED ORDER — NOREPINEPHRINE 4 MG/250ML-% IV SOLN
2.0000 ug/min | INTRAVENOUS | Status: DC
  Administered 2022-12-26: 2 ug/min via INTRAVENOUS
  Administered 2022-12-27: 16 ug/min via INTRAVENOUS
  Administered 2022-12-27: 8 ug/min via INTRAVENOUS
  Filled 2022-12-26 (×3): qty 250

## 2022-12-26 MED ORDER — SODIUM CHLORIDE 0.9 % IV SOLN
50.0000 ug/h | INTRAVENOUS | Status: AC
  Administered 2022-12-26 – 2022-12-29 (×7): 50 ug/h via INTRAVENOUS
  Filled 2022-12-26 (×8): qty 1

## 2022-12-26 MED ORDER — MIDAZOLAM HCL 2 MG/2ML IJ SOLN
2.0000 mg | Freq: Once | INTRAMUSCULAR | Status: AC
  Administered 2022-12-26: 2 mg via INTRAVENOUS

## 2022-12-26 MED ORDER — PHENYLEPHRINE 80 MCG/ML (10ML) SYRINGE FOR IV PUSH (FOR BLOOD PRESSURE SUPPORT)
200.0000 ug | PREFILLED_SYRINGE | Freq: Once | INTRAVENOUS | Status: AC
  Administered 2022-12-26: 200 ug via INTRAVENOUS

## 2022-12-26 MED ORDER — SODIUM CHLORIDE 0.9 % IV BOLUS
1000.0000 mL | Freq: Once | INTRAVENOUS | Status: AC
  Administered 2022-12-26: 1000 mL via INTRAVENOUS

## 2022-12-26 MED ORDER — ROCURONIUM BROMIDE 10 MG/ML (PF) SYRINGE
PREFILLED_SYRINGE | INTRAVENOUS | Status: AC
  Administered 2022-12-26: 80 mg
  Filled 2022-12-26: qty 10

## 2022-12-26 MED ORDER — MIDAZOLAM HCL 2 MG/2ML IJ SOLN
INTRAMUSCULAR | Status: AC
  Filled 2022-12-26: qty 2

## 2022-12-26 MED ORDER — DEXMEDETOMIDINE HCL IN NACL 400 MCG/100ML IV SOLN
0.0000 ug/kg/h | INTRAVENOUS | Status: DC
  Administered 2022-12-26: 1.2 ug/kg/h via INTRAVENOUS
  Administered 2022-12-26: 0.4 ug/kg/h via INTRAVENOUS
  Administered 2022-12-27: 1 ug/kg/h via INTRAVENOUS
  Filled 2022-12-26 (×2): qty 100

## 2022-12-26 MED ORDER — DEXMEDETOMIDINE HCL IN NACL 400 MCG/100ML IV SOLN
INTRAVENOUS | Status: AC
  Filled 2022-12-26: qty 100

## 2022-12-26 SURGICAL SUPPLY — 15 items

## 2022-12-26 NOTE — Progress Notes (Addendum)
eLink Physician-Brief Progress Note Patient Name: Stephen Miranda DOB: [birth year] MRN: [id number]   Date of Service  12/26/2022  HPI/Events of Note  52 year old male with a history of alcohol use disorder complicated by cirrhosis and esophageal varices who presents to the hospital with hematemesis and hemorrhagic shock.  Admitted to the ICU for management, intubated in the setting of respiratory failure.  Patient initially admitted with tachycardia, hypotension 96% on room air.  Oxygenating and ventilating appropriately on PRVC mechanical ventilation.  Started on octreotide and pantoprazole.  Sedation with Precedex  Laboratory studies consistent with hypoalbuminemia.  CBC with mild normocytic anemia and mild thrombocytopenia.  INR elevated to 1.5.  EGD pending.  eICU Interventions  Agree with IV PPI and ceftriaxone prophylaxis. Chemoprophylaxis for DVT is contraindicated. Maintain octreotide drip Pending EGD in room, no immediate intervention indicated.   2300 -IntraOp, the patient developed significant hypotension down to 60s systolic.  This was sustained over a few minutes, phenylephrine 200 mcg was pushed and repeated once.  Norepinephrine was initiated.  LR bolus is being administered.  Procedures otherwise uncomplicated thus far.  Will repeat a chest radiograph after the procedure.  Advance ET tube 2 cm.  0031 -EGD completed, 5 bands placed.  Suspected source control achieved.  Norepinephrine still running at 10 mcg via PIV.  Intermittently bradycardic with the Precedex-discussed hold parameters as long as heart rate above 40 and blood pressure sustained, okay to maintain Precedex.  Will add on as needed Versed pushes.  Add on restraints as needed. chest radiograph being completed now.  0034 -no evidence of pneumothorax, tube in appropriate position above the carina.  0158 -adequate IV access, can hold on PICC line/central line for now.  Intervention Category Evaluation Type: New Patient  Evaluation  [name] 12/26/2022, 9:59 PM

## 2022-12-26 NOTE — ED Provider Notes (Signed)
[place] EMERGENCY DEPARTMENT AT [facility] Provider Note   CSN: [id number] Arrival date & time: 12/26/22  1754     History  Chief Complaint  Patient presents with   Hematemesis    Zayvien Dubicki is a 52 y.o. male.  The history is provided by the patient, medical records and the EMS personnel. No language interpreter was used.     52 year old male with known history of esophageal varices from prior alcohol use presenting with complaints of vomiting and blood.  Patient states throughout the day today he felt little bit off and subsequently has had 4 separate episodes of vomiting mixed with dark blood.  He does endorse feeling a bit queasy after the vomiting but denies any significant pain.  No lightheadedness or dizziness no chest pain or shortness of breath no significant abdominal pain or back pain.  Denies any recent alcohol use.  States last alcohol use was at least 3 years ago.  Denies any NSAIDs use.  States he was doing fine leading up to this event.  His GI specialist is Dr. Lauretta Grill.  He is not on any blood thinner medication.  He felt he vomited a total of 1 L of vomitus mixed with blood.  Home Medications Prior to Admission medications   Medication Sig Start Date End Date Taking? Authorizing Provider  carvedilol (COREG) 3.125 MG tablet TAKE 1 TABLET BY MOUTH IN THE MORNING, and TAKE 2 TABLETS AT BEDTIME 12/16/22   Julieanne Manson, MD  escitalopram (LEXAPRO) 5 MG tablet Take 1 tablet (5 mg total) by mouth daily. 11/18/22   Julieanne Manson, MD  ferrous gluconate (FERGON) 324 MG tablet Take 324 mg by mouth daily with breakfast.    [provider]  furosemide (LASIX) 20 MG tablet 1 tab by mouth daily in morning with Spironolactone 07/11/22   Julieanne Manson, MD  Multiple Vitamin (MULTIVITAMIN WITH MINERALS) TABS tablet Take 1 tablet by mouth daily. 05/23/19   Calvert Cantor, MD  pantoprazole (PROTONIX) 40 MG tablet Take 1 tablet (40 mg total) by mouth 2  (two) times daily. 07/11/22   Julieanne Manson, MD  spironolactone (ALDACTONE) 50 MG tablet 1 tab by mouth in morning with furosemide 07/11/22   Julieanne Manson, MD  tamsulosin (FLOMAX) 0.4 MG CAPS capsule Take 1 capsule (0.4 mg total) by mouth daily after supper. 07/11/22   Julieanne Manson, MD      Allergies    Patient has no known allergies.    Review of Systems   Review of Systems  All other systems reviewed and are negative.   Physical Exam Updated Vital Signs BP 104/71 (BP Location: Right Arm)   Pulse (!) 110   Temp 98.1 F (36.7 C) (Oral)   Resp 14   Ht 6\' 2"  (1.88 m)   Wt 81.6 kg   SpO2 97%   BMI 23.11 kg/m  Physical Exam Vitals and nursing note reviewed.  Constitutional:      General: He is not in acute distress.    Appearance: He is well-developed.  HENT:     Head: Atraumatic.     Mouth/Throat:     Mouth: Mucous membranes are dry.  Eyes:     Conjunctiva/sclera: Conjunctivae normal.  Cardiovascular:     Rate and Rhythm: Tachycardia present.     Pulses: Normal pulses.     Heart sounds: Murmur heard.  Pulmonary:     Effort: Pulmonary effort is normal.     Breath sounds: Normal breath sounds. No wheezing,  rhonchi or rales.  Abdominal:     Palpations: Abdomen is soft.     Tenderness: There is no abdominal tenderness. There is no guarding or rebound.  Musculoskeletal:     Cervical back: Neck supple.  Skin:    Findings: No rash.  Neurological:     Mental Status: He is alert.     ED Results / Procedures / Treatments   Labs (all labs ordered are listed, but only abnormal results are displayed) Labs Reviewed  CBC WITH DIFFERENTIAL/PLATELET - Abnormal; Notable for the following components:      Result Value   RBC 3.88 (*)    Hemoglobin 11.9 (*)    HCT 37.2 (*)    RDW 16.1 (*)    Platelets 113 (*)    All other components within normal limits  COMPREHENSIVE METABOLIC PANEL - Abnormal; Notable for the following components:   CO2 20 (*)     Glucose, Bld 117 (*)    BUN 28 (*)    Calcium 8.3 (*)    Total Protein 5.5 (*)    Albumin 2.7 (*)    All other components within normal limits  PROTIME-INR - Abnormal; Notable for the following components:   Prothrombin Time 18.2 (*)    INR 1.5 (*)    All other components within normal limits  LIPASE, BLOOD  URINALYSIS, ROUTINE W REFLEX MICROSCOPIC  CBC  BASIC METABOLIC PANEL  MAGNESIUM  PHOSPHORUS  CBC  HEMOGLOBIN A1C  POC OCCULT BLOOD, ED  TYPE AND SCREEN  PREPARE RBC (CROSSMATCH)  PREPARE RBC (CROSSMATCH)    EKG None ED ECG REPORT   Date: 12/26/2022  Rate: 109  Rhythm: sinus tachycardia  QRS Axis: normal  Intervals: normal  ST/T Wave abnormalities: normal  Conduction Disutrbances:none  Narrative Interpretation:   Old EKG Reviewed: unchanged  I have personally reviewed the EKG tracing and agree with the computerized printout as noted.   Radiology No results found.  Procedures .Critical Care  Performed by: Fayrene Helper, PA-C Authorized by: Fayrene Helper, PA-C   Critical care provider statement:    Critical care time (minutes):  37   Critical care was time spent personally by me on the following activities:  Development of treatment plan with patient or surrogate, discussions with consultants, evaluation of patient's response to treatment, examination of patient, ordering and review of laboratory studies, ordering and review of radiographic studies, ordering and performing treatments and interventions, pulse oximetry, re-evaluation of patient's condition and review of old charts     Medications Ordered in ED Medications  octreotide (SANDOSTATIN) 2 mcg/mL load via infusion 50 mcg (50 mcg Intravenous Bolus from Bag 12/26/22 1916)    And  octreotide (SANDOSTATIN) 500 mcg in sodium chloride 0.9 % 250 mL (2 mcg/mL) infusion (50 mcg/hr Intravenous New Bag/Given 12/26/22 1915)  0.9 %  sodium chloride infusion (Manually program via Guardrails IV Fluids) (has no  administration in time range)  0.9 %  sodium chloride infusion (Manually program via Guardrails IV Fluids) (has no administration in time range)  pantoprazole (PROTONIX) 80 mg /NS 100 mL IVPB (80 mg Intravenous New Bag/Given 12/26/22 2016)  pantoprozole (PROTONIX) 80 mg /NS 100 mL infusion (has no administration in time range)  docusate sodium (COLACE) capsule 100 mg (has no administration in time range)  polyethylene glycol (MIRALAX / GLYCOLAX) packet 17 g (has no administration in time range)  ondansetron (ZOFRAN) injection 4 mg (has no administration in time range)  insulin aspart (novoLOG) injection 0-15 Units (has no  administration in time range)  pantoprazole (PROTONIX) injection 40 mg (40 mg Intravenous Given 12/26/22 1849)  sodium chloride 0.9 % bolus 1,000 mL (1,000 mLs Intravenous New Bag/Given 12/26/22 1846)  ondansetron (ZOFRAN) injection 4 mg (4 mg Intravenous Given 12/26/22 1848)  ondansetron (ZOFRAN) injection 4 mg (4 mg Intravenous Given 12/26/22 1955)  sodium chloride 0.9 % bolus 1,000 mL (1,000 mLs Intravenous New Bag/Given 12/26/22 2008)    ED Course/ Medical Decision Making/ A&P                             Medical Decision Making Amount and/or Complexity of Data Reviewed Labs: ordered.  Risk Prescription drug management. Decision regarding hospitalization.   BP 104/71 (BP Location: Right Arm)   Pulse (!) 110   Temp 98.1 F (36.7 C) (Oral)   Resp 14   Ht 6\' 2"  (1.88 m)   Wt 81.6 kg   SpO2 97%   BMI 23.11 kg/m   6:54 PM Mr. Caffrey is a 52 year old male who presents to the ED today for chief complaint of hematemesis x 1 day. He reports a sudden onset of vomiting with dark blood approximately one hour ago, total of 4 episodes. The patient states that he has not felt well since he woke up and has been lying in bed all day feeling nauseous with some associated sweating. He does not use NSAIDs, he does not consume alcohol, but he smokes approximately 1/3 pack per day. He had a  similar episode of hematemesis 7 months ago in which he states the blood loss was much more severe. The patient is currently not in pain but describes discomfort in his throat from the previous vomiting episodes.   On exam, patient is laying in bed in no acute discomfort.  He does have some dried blood noted on his clothing and mouth.  He is tachycardic noted clear to auscultation abdomen is soft nontender no stigmata no caput medusa.  Vital signs notable for blood pressure of 104/71, heart rate of 110.  Patient is afebrile no hypoxia.  Patient here with submassive hematemesis concerning for esophageal varices.  Doubt Mallory-Weiss tear, PUD, or Boerhaave's.  Given prior history of alcohol induced esophageal varices, will initiate octreotide, Protonix, IV fluid, Zofran, and will consult GI specialist for hospital admission.  Patient is a full code.  -Labs ordered, independently viewed and interpreted by me.  Labs remarkable for Hgb 11.9, likely hemoconcentrated as his baseline Hgb is 9s.  -The patient was maintained on a cardiac monitor.  I personally viewed and interpreted the cardiac monitored which showed an underlying rhythm of: NSR -Imaging including endoscopy considered and will defer to GI specialist -This patient presents to the ED for concern of hematemesis, this involves an extensive number of treatment options, and is a complaint that carries with it a high risk of complications and morbidity.  The differential diagnosis includes esophageal bleed, mallory weiss tear, Boerrhaves, GERD, gastritis, PUD -Co morbidities that complicate the patient evaluation includes esophageal varices -Treatment includes Protonix, Octreotide, IVF, zofran -Reevaluation of the patient after these medicines showed that the patient improved -PCP office notes or outside notes reviewed -Discussion with specialist GI specialist Dr. Leonides Schanz who will be involve in pt care.  She recommend notifying her if pt decompensate  and need urgent endoscopy -Escalation to admission/observation considered: patient will be admitted  7:46 PM After initial consult with GI specialist Dr. Leonides Schanz, and patient had a  bout of hematemesis, vomiting of approximately 600 mL of dark red blood.  Blood pressure drops to 80 systolic send patient became tachycardic.  I have ordered a unit of red blood cells, I have also consulted with critical care team who agrees to see and evaluate patient.  Will reconsult GI specialist Dr. Leonides Schanz as patient would likely benefit from emergent endoscopy.  8:34 PM Critical care team has seen and evaluated patient and have agreed to admit patient.  Dr. Leonides Schanz is made aware and will perform urgent endoscopy.  Patient is a full code.        Final Clinical Impression(s) / ED Diagnoses Final diagnoses:  Acute upper GI bleed  Hematemesis, unspecified whether nausea present    Rx / DC Orders ED Discharge Orders     None         Fayrene Helper, PA-C 12/26/22 2036    Pricilla Loveless, MD 12/31/22 [phone]

## 2022-12-26 NOTE — Interval H&P Note (Signed)
History and Physical Interval Note:  12/26/2022 9:58 PM  Stephen Miranda  has presented today for surgery, with the diagnosis of Hematemesis.  The various methods of treatment have been discussed with the patient and family. After consideration of risks, benefits and other options for treatment, the patient has consented to  Procedure(s): ESOPHAGOGASTRODUODENOSCOPY (EGD) WITH PROPOFOL (N/A) as a surgical intervention.  The patient's history has been reviewed, patient examined, no change in status, stable for surgery.  I have reviewed the patient's chart and labs.  Questions were answered to the patient's satisfaction.     Stephen Miranda

## 2022-12-26 NOTE — Progress Notes (Signed)
An USGPIV (ultrasound guided PIV) has been placed for short-term vasopressor infusion. A correctly placed ivWatch must be used when administering Vasopressors. Should this treatment be needed beyond 72 hours, central line access should be obtained.  It will be the responsibility of the bedside nurse to follow best practice to prevent extravasations.   

## 2022-12-26 NOTE — ED Triage Notes (Signed)
Pt presents from home via EMS for c/o blood in vomit that onset at 1700.  Medic states there was a large amount of vomit in house upon arrival. Pt reports 3-4 episodes of vomiting. Hx of esophageal varices. Patient alert and oriented. VS as charted.

## 2022-12-26 NOTE — H&P (View-Only) (Signed)
Referring Provider:  Dr. Merrily Pew         Primary Care Physician:  Julieanne Manson, MD Primary Gastroenterologist:  Dr. Meridee Score           Reason for Consultation:  Hematemesis                 ASSESSMENT /  PLAN    Hematemesis EtOH cirrhosis  Esophageal varices Hiatal hernia Gastric varices s/p BRTO Ascites Patient presents with hematemesis and has previously been noted to have grade 2 EV on EGD in 06/2022. His GOV2 have bled in the past and been treated with BRTO therapy. Will plan for EGD in order to determine the source of his hematemesis and hopefully apply treatment.  - Continue to trend Hb - IV PPI gtt, octreotide gtt, CTX - EGD in ICU  HPI:    Stephen Miranda is a 52 y.o. male with history of EtOH cirrhosis with EV, gastric varices s/p BRTO, Barrett's esophagus, Richter's hernia presents with hematemesis.   He started developing hematemesis earlier today. Had another episode of hematemesis in the ED with about 600 mL of bloody emesis measured. Denies any recent alcohol use. Last alcohol use was 3 years ago. Denies use of blood thinners. Denies NSAID use. Denies lightheadedness, SOB, or chest pain. Denies melena. He denies any episode of N&V prior to the onset of hematemesis. Denies confusion, abdominal distension, BLE edema. Denies dysphagia, chest burning, regurgitation, diarrhea, constipation. Denies jaundice or scleral icterus. Patient is currently hemodynamically stable, but during his episode of hematemesis in the ED, blood pressure did drop to 74/52 and HR did increase to 118. He is currently being transfused 1 U pRBC. Labs upon arrival showed Hb 11.9 (up from 9.1 in 07/2022), low plts of 113, elevated INR of 1.5, elevated glucose of 117, elevated BUN of 28, low albumin of 2.7. LFTs are nml. Lipase is normal.  IR BRTO 07/04/22: IMPRESSION: Successful balloon occluded retrograde transvenous obliteration (BRTO) of the gastric varices as detailed above. Follow-up CTA (BRTO  protocol) will be performed 48 hours to assess for complete GV occlusion.   CTA A/P w/contrast 07/05/22: IMPRESSION: 1. Postprocedural changes status post coil assisted transvenous obliteration of gastroesophageal varices, no complicating features. 2. Similar appearing cirrhotic stigmata and trace ascites  CT A/P w/o contrast 07/10/22: IMPRESSION: 1. Increase in ascites, now with moderately large volume of ascites mainly in the lower abdomen and pelvis. 2. Marked cirrhosis and evidence of portal hypertension with changes of occlusion of varices about the stomach and esophagus. 3. Umbilical hernia now contains a small knuckle of small bowel. No sign of obstruction. Would however correlate with any worsening abdominal symptoms about the umbilicus given that this may represent a Richter's type hernia which has a higher incidence of complication than a hernia which contains the entire bowel loop.  Liver doppler 07/18/22: IMPRESSION: 1. Morphologic changes of cirrhosis with portal hypertension as evidenced by large volume ascites and mild splenomegaly. Patent portal venous system. 2. Large volume ascites, likely secondary to increased portal hypertension after recent transvenous obliteration of gastroesophageal varices.  EGD 05/20/19:   EGD 07/03/22:    Past Medical History:  Diagnosis Date   Chronic insomnia 05/23/2016   Has stated in past since age 61 yo, but worse in recent years with restless legs.   Elevated liver enzymes 04/24/2015   04/18/2015:  AST:  115     ALT:  90. Likely due to alcohol intake   Malignant melanoma of back (HCC) 02/07/2015  Left upper back:  Dr. Irene Limbo, [facility]   Restless legs 05/23/2016   Seborrheic dermatitis 05/23/2016    Past Surgical History:  Procedure Laterality Date   ESOPHAGOGASTRODUODENOSCOPY N/A 07/03/2022   Procedure: ESOPHAGOGASTRODUODENOSCOPY (EGD);  Surgeon: Beverley Fiedler, MD;  Location: [facility] ENDOSCOPY;   Service: Gastroenterology;  Laterality: N/A;   ESOPHAGOGASTRODUODENOSCOPY (EGD) WITH PROPOFOL N/A 05/20/2019   Procedure: ESOPHAGOGASTRODUODENOSCOPY (EGD) WITH PROPOFOL;  Surgeon: Meridee Score Netty Starring., MD;  Location: [facility] ENDOSCOPY;  Service: Gastroenterology;  Laterality: N/A;   Excision Malignant Melanoma Left 02/07/2015   Left upper back   IR ANGIOGRAM SELECTIVE EACH ADDITIONAL VESSEL  07/04/2022   IR EMBO ART  VEN HEMORR LYMPH EXTRAV  INC GUIDE ROADMAPPING  07/04/2022   IR PARACENTESIS  07/18/2022   IR US GUIDE VASC ACCESS RIGHT  07/04/2022   IR VENOGRAM RENAL UNI LEFT  07/04/2022   PARACENTESIS  07/18/2022   RADIOLOGY WITH ANESTHESIA N/A 07/04/2022   Procedure: IR WITH ANESTHESIA - TIPS;  Surgeon: Radiologist, Medication, MD;  Location: [facility] OR;  Service: Radiology;  Laterality: N/A;    Prior to Admission medications   Medication Sig Start Date End Date Taking? Authorizing Provider  carvedilol (COREG) 3.125 MG tablet TAKE 1 TABLET BY MOUTH IN THE MORNING, and TAKE 2 TABLETS AT BEDTIME 12/16/22   Julieanne Manson, MD  escitalopram (LEXAPRO) 5 MG tablet Take 1 tablet (5 mg total) by mouth daily. 11/18/22   Julieanne Manson, MD  ferrous gluconate (FERGON) 324 MG tablet Take 324 mg by mouth daily with breakfast.    [provider]  furosemide (LASIX) 20 MG tablet 1 tab by mouth daily in morning with Spironolactone 07/11/22   Julieanne Manson, MD  Multiple Vitamin (MULTIVITAMIN WITH MINERALS) TABS tablet Take 1 tablet by mouth daily. 05/23/19   Calvert Cantor, MD  pantoprazole (PROTONIX) 40 MG tablet Take 1 tablet (40 mg total) by mouth 2 (two) times daily. 07/11/22   Julieanne Manson, MD  spironolactone (ALDACTONE) 50 MG tablet 1 tab by mouth in morning with furosemide 07/11/22   Julieanne Manson, MD  tamsulosin (FLOMAX) 0.4 MG CAPS capsule Take 1 capsule (0.4 mg total) by mouth daily after supper. 07/11/22   Julieanne Manson, MD    Current Facility-Administered  Medications  Medication Dose Route Frequency Provider Last Rate Last Admin   0.9 %  sodium chloride infusion (Manually program via Guardrails IV Fluids)   Intravenous Once Pricilla Loveless, MD       0.9 %  sodium chloride infusion (Manually program via Guardrails IV Fluids)   Intravenous Once Fayrene Helper, PA-C       docusate sodium (COLACE) capsule 100 mg  100 mg Oral BID PRN Gleason, Darcella Gasman, PA-C       insulin aspart (novoLOG) injection 0-15 Units  0-15 Units Subcutaneous Q4H Gleason, Darcella Gasman, PA-C       octreotide (SANDOSTATIN) 500 mcg in sodium chloride 0.9 % 250 mL (2 mcg/mL) infusion  50 mcg/hr Intravenous Continuous Fayrene Helper, PA-C 25 mL/hr at 12/26/22 1915 50 mcg/hr at 12/26/22 1915   ondansetron (ZOFRAN) injection 4 mg  4 mg Intravenous Q6H PRN Gleason, Darcella Gasman, PA-C       pantoprozole (PROTONIX) 80 mg /NS 100 mL infusion  8 mg/hr Intravenous Continuous Fayrene Helper, PA-C 10 mL/hr at 12/26/22 2037 8 mg/hr at 12/26/22 2037   polyethylene glycol (MIRALAX / GLYCOLAX) packet 17 g  17 g Oral Daily PRN Gleason, Darcella Gasman, PA-C  Allergies as of 12/26/2022   (No Known Allergies)    Family History  Problem Relation Age of Onset   Fibromyalgia Mother     Social History   Tobacco Use   Smoking status: Every Day    Packs/day: 0.50    Years: 20.00    Additional pack years: 0.00    Total pack years: 10.00    Types: Cigarettes    Passive exposure: Never   Smokeless tobacco: Never   Tobacco comments:    prescribed Chantix in past, but did not take  Vaping Use   Vaping Use: Never used  Substance Use Topics   Alcohol use: Not Currently    Alcohol/week: 4.0 standard drinks of alcohol    Types: 2 Cans of beer, 2 Shots of liquor per week    Comment: Nightly   Drug use: No    Review of Systems: All systems reviewed and negative except where noted in HPI.  Physical Exam: Vital signs in last 24 hours: Temp:  [98.1 F (36.7 C)-98.8 F (37.1 C)] 98.8 F (37.1 C) (06/06  2030) Pulse Rate:  [71-118] 100 (06/06 2040) Resp:  [8-18] 15 (06/06 2040) BP: (74-116)/(49-76) 115/69 (06/06 2040) SpO2:  [94 %-99 %] 98 % (06/06 2040) Weight:  [81.6 kg] 81.6 kg (06/06 1804)   General:   Awake, alert, NAD Psych:  Pleasant, cooperative. Normal mood and affect. Eyes:  Pupils equal, sclera clear, no icterus.    Neck:  Supple; no masses Lungs:  Clear throughout to auscultation.   No wheezes, crackles, or rhonchi.  Heart:  Regular rate and rhythm; no murmurs, no lower extremity edema Abdomen:  Soft, non-distended, nontender, BS active, no palp mass   Rectal:  Deferred  Msk:  Symmetrical without gross deformities. . Neurologic:  Alert and  oriented x4;  grossly normal neurologically. Skin:  Intact without significant lesions or rashes.  Intake/Output from previous day: No intake/output data recorded. Intake/Output this shift: Total I/O In: 100 [IV Piggyback:100] Out: -   Lab Results: Recent Labs    12/26/22 1811  WBC 7.2  HGB 11.9*  HCT 37.2*  PLT 113*   BMET Recent Labs    12/26/22 1811  NA 140  K 4.2  CL 111  CO2 20*  GLUCOSE 117*  BUN 28*  CREATININE 0.73  CALCIUM 8.3*   LFT Recent Labs    12/26/22 1811  PROT 5.5*  ALBUMIN 2.7*  AST 32  ALT 20  ALKPHOS 58  BILITOT 1.1   PT/INR Recent Labs    12/26/22 1810  LABPROT 18.2*  INR 1.5*   Hepatitis Panel No results for input(s): "HEPBSAG", "HCVAB", "HEPAIGM", "HEPBIGM" in the last 72 hours.   .    Latest Ref Rng & Units 12/26/2022    6:11 PM 08/19/2022    4:37 PM 07/11/2022   11:42 AM  CBC  WBC 4.0 - 10.5 K/uL 7.2  3.4  11.6   Hemoglobin 13.0 - 17.0 g/dL 29.5  9.1  9.3   Hematocrit 39.0 - 52.0 % 37.2  26.8  26.6   Platelets 150 - 400 K/uL 113  104  207     .    Latest Ref Rng & Units 12/26/2022    6:11 PM 08/19/2022    4:37 PM 07/24/2022    2:32 PM  CMP  Glucose 70 - 99 mg/dL 284  97  88   BUN 6 - 20 mg/dL 28  9  8    Creatinine 0.61 - 1.24  mg/dL 1.61  0.96  0.45   Sodium  135 - 145 mmol/L 140  138  136   Potassium 3.5 - 5.1 mmol/L 4.2  4.2  4.7   Chloride 98 - 111 mmol/L 111  103  102   CO2 22 - 32 mmol/L 20  23  28    Calcium 8.9 - 10.3 mg/dL 8.3  9.1  8.9   Total Protein 6.5 - 8.1 g/dL 5.5     Total Bilirubin 0.3 - 1.2 mg/dL 1.1     Alkaline Phos 38 - 126 U/L 58     AST 15 - 41 U/L 32     ALT 0 - 44 U/L 20      Studies/Results: No results found.  Principal Problem:   GIB (gastrointestinal bleeding) Active Problems:   Acute upper GI bleed   Bleeding esophageal varices (HCC)    Eulah Pont, M.D. @  12/26/2022, 9:22 PM

## 2022-12-26 NOTE — Consult Note (Addendum)
Referring Provider:  Dr. Merrily Pew         Primary Care Physician:  Julieanne Manson, MD Primary Gastroenterologist:  Dr. Meridee Score           Reason for Consultation:  Hematemesis                 ASSESSMENT /  PLAN    Hematemesis EtOH cirrhosis  Esophageal varices Hiatal hernia Gastric varices s/p BRTO Ascites Patient presents with hematemesis and has previously been noted to have grade 2 EV on EGD in 06/2022. His GOV2 have bled in the past and been treated with BRTO therapy. Will plan for EGD in order to determine the source of his hematemesis. He does have history of ascites but this appears to well controlled on his home Lasix and spironolactone. - Continue to trend Hb - IV PPI gtt, octreotide gtt, CTX - EGD in ICU  HPI:    Stephen Miranda is a 52 y.o. male with history of EtOH cirrhosis with EV, gastric varices s/p BRTO, Barrett's esophagus, Richter's hernia presents with hematemesis.   He started developing hematemesis earlier today. Had another episode of hematemesis in the ED with about 600 mL of bloody emesis measured. Denies any recent alcohol use. Last alcohol use was 3 years ago. Denies use of blood thinners. Denies NSAID use. Denies lightheadedness, SOB, or chest pain. Denies melena. He denies any episode of N&V prior to the onset of hematemesis. Denies confusion, abdominal distension, BLE edema. Denies dysphagia, chest burning, regurgitation, diarrhea, constipation. Denies jaundice or scleral icterus. Patient is currently hemodynamically stable, but during his episode of hematemesis in the ED, blood pressure did drop to 74/52 and HR did increase to 118. He is currently being transfused 1 U pRBC. Labs upon arrival showed Hb 11.9 (up from 9.1 in 07/2022), low plts of 113, elevated INR of 1.5, elevated glucose of 117, elevated BUN of 28, low albumin of 2.7. LFTs are nml. Lipase is normal.  IR BRTO 07/04/22: IMPRESSION: Successful balloon occluded retrograde transvenous  obliteration (BRTO) of the gastric varices as detailed above. Follow-up CTA (BRTO protocol) will be performed 48 hours to assess for complete GV occlusion.   CTA A/P w/contrast 07/05/22: IMPRESSION: 1. Postprocedural changes status post coil assisted transvenous obliteration of gastroesophageal varices, no complicating features. 2. Similar appearing cirrhotic stigmata and trace ascites  CT A/P w/o contrast 07/10/22: IMPRESSION: 1. Increase in ascites, now with moderately large volume of ascites mainly in the lower abdomen and pelvis. 2. Marked cirrhosis and evidence of portal hypertension with changes of occlusion of varices about the stomach and esophagus. 3. Umbilical hernia now contains a small knuckle of small bowel. No sign of obstruction. Would however correlate with any worsening abdominal symptoms about the umbilicus given that this may represent a Richter's type hernia which has a higher incidence of complication than a hernia which contains the entire bowel loop.  Liver doppler 07/18/22: IMPRESSION: 1. Morphologic changes of cirrhosis with portal hypertension as evidenced by large volume ascites and mild splenomegaly. Patent portal venous system. 2. Large volume ascites, likely secondary to increased portal hypertension after recent transvenous obliteration of gastroesophageal varices.  EGD 05/20/19:   EGD 07/03/22:    Past Medical History:  Diagnosis Date   Chronic insomnia 05/23/2016   Has stated in past since age 60 yo, but worse in recent years with restless legs.   Elevated liver enzymes 04/24/2015   04/18/2015:  AST:  115     ALT:  90.  Likely due to alcohol intake   Malignant melanoma of back (HCC) 02/07/2015   Left upper back:  Dr. Irene Limbo, [facility]   Restless legs 05/23/2016   Seborrheic dermatitis 05/23/2016    Past Surgical History:  Procedure Laterality Date   ESOPHAGOGASTRODUODENOSCOPY N/A 07/03/2022   Procedure:  ESOPHAGOGASTRODUODENOSCOPY (EGD);  Surgeon: Beverley Fiedler, MD;  Location: [facility] ENDOSCOPY;  Service: Gastroenterology;  Laterality: N/A;   ESOPHAGOGASTRODUODENOSCOPY (EGD) WITH PROPOFOL N/A 05/20/2019   Procedure: ESOPHAGOGASTRODUODENOSCOPY (EGD) WITH PROPOFOL;  Surgeon: Meridee Score Netty Starring., MD;  Location: [facility] ENDOSCOPY;  Service: Gastroenterology;  Laterality: N/A;   Excision Malignant Melanoma Left 02/07/2015   Left upper back   IR ANGIOGRAM SELECTIVE EACH ADDITIONAL VESSEL  07/04/2022   IR EMBO ART  VEN HEMORR LYMPH EXTRAV  INC GUIDE ROADMAPPING  07/04/2022   IR PARACENTESIS  07/18/2022   IR US GUIDE VASC ACCESS RIGHT  07/04/2022   IR VENOGRAM RENAL UNI LEFT  07/04/2022   PARACENTESIS  07/18/2022   RADIOLOGY WITH ANESTHESIA N/A 07/04/2022   Procedure: IR WITH ANESTHESIA - TIPS;  Surgeon: Radiologist, Medication, MD;  Location: [facility] OR;  Service: Radiology;  Laterality: N/A;    Prior to Admission medications   Medication Sig Start Date End Date Taking? Authorizing Provider  carvedilol (COREG) 3.125 MG tablet TAKE 1 TABLET BY MOUTH IN THE MORNING, and TAKE 2 TABLETS AT BEDTIME 12/16/22   Julieanne Manson, MD  escitalopram (LEXAPRO) 5 MG tablet Take 1 tablet (5 mg total) by mouth daily. 11/18/22   Julieanne Manson, MD  ferrous gluconate (FERGON) 324 MG tablet Take 324 mg by mouth daily with breakfast.    [provider]  furosemide (LASIX) 20 MG tablet 1 tab by mouth daily in morning with Spironolactone 07/11/22   Julieanne Manson, MD  Multiple Vitamin (MULTIVITAMIN WITH MINERALS) TABS tablet Take 1 tablet by mouth daily. 05/23/19   Calvert Cantor, MD  pantoprazole (PROTONIX) 40 MG tablet Take 1 tablet (40 mg total) by mouth 2 (two) times daily. 07/11/22   Julieanne Manson, MD  spironolactone (ALDACTONE) 50 MG tablet 1 tab by mouth in morning with furosemide 07/11/22   Julieanne Manson, MD  tamsulosin (FLOMAX) 0.4 MG CAPS capsule Take 1 capsule (0.4 mg total) by mouth daily  after supper. 07/11/22   Julieanne Manson, MD    Current Facility-Administered Medications  Medication Dose Route Frequency Provider Last Rate Last Admin   0.9 %  sodium chloride infusion (Manually program via Guardrails IV Fluids)   Intravenous Once Pricilla Loveless, MD       0.9 %  sodium chloride infusion (Manually program via Guardrails IV Fluids)   Intravenous Once Fayrene Helper, PA-C       docusate sodium (COLACE) capsule 100 mg  100 mg Oral BID PRN Gleason, Darcella Gasman, PA-C       insulin aspart (novoLOG) injection 0-15 Units  0-15 Units Subcutaneous Q4H Gleason, Darcella Gasman, PA-C       octreotide (SANDOSTATIN) 500 mcg in sodium chloride 0.9 % 250 mL (2 mcg/mL) infusion  50 mcg/hr Intravenous Continuous Fayrene Helper, PA-C 25 mL/hr at 12/26/22 1915 50 mcg/hr at 12/26/22 1915   ondansetron (ZOFRAN) injection 4 mg  4 mg Intravenous Q6H PRN Gleason, Darcella Gasman, PA-C       pantoprozole (PROTONIX) 80 mg /NS 100 mL infusion  8 mg/hr Intravenous Continuous Fayrene Helper, PA-C 10 mL/hr at 12/26/22 2037 8 mg/hr at 12/26/22 2037   polyethylene glycol (MIRALAX / GLYCOLAX) packet 17 g  17  g Oral Daily PRN Gleason, Darcella Gasman, PA-C        Allergies as of 12/26/2022   (No Known Allergies)    Family History  Problem Relation Age of Onset   Fibromyalgia Mother     Social History   Tobacco Use   Smoking status: Every Day    Packs/day: 0.50    Years: 20.00    Additional pack years: 0.00    Total pack years: 10.00    Types: Cigarettes    Passive exposure: Never   Smokeless tobacco: Never   Tobacco comments:    prescribed Chantix in past, but did not take  Vaping Use   Vaping Use: Never used  Substance Use Topics   Alcohol use: Not Currently    Alcohol/week: 4.0 standard drinks of alcohol    Types: 2 Cans of beer, 2 Shots of liquor per week    Comment: Nightly   Drug use: No    Review of Systems: All systems reviewed and negative except where noted in HPI.  Physical Exam: Vital signs in last 24  hours: Temp:  [98.1 F (36.7 C)-98.8 F (37.1 C)] 98.8 F (37.1 C) (06/06 2030) Pulse Rate:  [71-118] 100 (06/06 2040) Resp:  [8-18] 15 (06/06 2040) BP: (74-116)/(49-76) 115/69 (06/06 2040) SpO2:  [94 %-99 %] 98 % (06/06 2040) Weight:  [81.6 kg] 81.6 kg (06/06 1804)   General:   Awake, alert, NAD Psych:  Pleasant, cooperative. Normal mood and affect. Eyes:  Pupils equal, sclera clear, no icterus.    Neck:  Supple; no masses Lungs:  Clear throughout to auscultation.   No wheezes, crackles, or rhonchi.  Heart:  Regular rate and rhythm; no murmurs, no lower extremity edema Abdomen:  Soft, non-distended, nontender, BS active, no palp mass   Rectal:  Deferred  Msk:  Symmetrical without gross deformities. . Neurologic:  Alert and  oriented x4;  grossly normal neurologically. Skin:  Intact without significant lesions or rashes.  Intake/Output from previous day: No intake/output data recorded. Intake/Output this shift: Total I/O In: 100 [IV Piggyback:100] Out: -   Lab Results: Recent Labs    12/26/22 1811  WBC 7.2  HGB 11.9*  HCT 37.2*  PLT 113*   BMET Recent Labs    12/26/22 1811  NA 140  K 4.2  CL 111  CO2 20*  GLUCOSE 117*  BUN 28*  CREATININE 0.73  CALCIUM 8.3*   LFT Recent Labs    12/26/22 1811  PROT 5.5*  ALBUMIN 2.7*  AST 32  ALT 20  ALKPHOS 58  BILITOT 1.1   PT/INR Recent Labs    12/26/22 1810  LABPROT 18.2*  INR 1.5*   Hepatitis Panel No results for input(s): "HEPBSAG", "HCVAB", "HEPAIGM", "HEPBIGM" in the last 72 hours.   .    Latest Ref Rng & Units 12/26/2022    6:11 PM 08/19/2022    4:37 PM 07/11/2022   11:42 AM  CBC  WBC 4.0 - 10.5 K/uL 7.2  3.4  11.6   Hemoglobin 13.0 - 17.0 g/dL 16.1  9.1  9.3   Hematocrit 39.0 - 52.0 % 37.2  26.8  26.6   Platelets 150 - 400 K/uL 113  104  207     .    Latest Ref Rng & Units 12/26/2022    6:11 PM 08/19/2022    4:37 PM 07/24/2022    2:32 PM  CMP  Glucose 70 - 99 mg/dL 096  97  88   BUN  6 -  20 mg/dL 28  9  8    Creatinine 0.61 - 1.24 mg/dL 8.11  9.14  7.82   Sodium 135 - 145 mmol/L 140  138  136   Potassium 3.5 - 5.1 mmol/L 4.2  4.2  4.7   Chloride 98 - 111 mmol/L 111  103  102   CO2 22 - 32 mmol/L 20  23  28    Calcium 8.9 - 10.3 mg/dL 8.3  9.1  8.9   Total Protein 6.5 - 8.1 g/dL 5.5     Total Bilirubin 0.3 - 1.2 mg/dL 1.1     Alkaline Phos 38 - 126 U/L 58     AST 15 - 41 U/L 32     ALT 0 - 44 U/L 20      Studies/Results: No results found.  Principal Problem:   GIB (gastrointestinal bleeding) Active Problems:   Acute upper GI bleed   Bleeding esophageal varices (HCC)    Eulah Pont, M.D. @  12/26/2022, 9:22 PM

## 2022-12-26 NOTE — Progress Notes (Signed)
Dr. Merrily Pew at bedside at 2120. Patient explained plan of the evening, which is to intubate for EGD.   Etomidate 20 given at 2134.  80 of rock given at 2134.  2 of versed given at 2135.  ET Tube in at 2136.  Precedex hung per order - see mar.

## 2022-12-26 NOTE — H&P (Signed)
NAME:  Stephen Miranda, MRN:  [id number], DOB:  Sep 03, 1970, LOS: 0 ADMISSION DATE:  12/26/2022, CONSULTATION DATE:  12/26/22 REFERRING MD:  EDP, CHIEF COMPLAINT:  hematemesis    History of Present Illness:  Stephen Miranda is a 52 y.o. M with PMH significant for prior ETOH use with known grade II esophageal varices and cirrhosis who presents with the onset of hematemesis today.  He reports no recent alcohol intake and does not use NSAIDS.  No upper abdominal pain or recent n/v.   He had the sudden onset of vomiting dark red blood about one hour before arrival.  He was not in distress and was hemodynamically stable with a Hgb of 11.9 and platelets of 113 without abnormal coags, however he had two additional episodes of hematemesis of 500cc and blood pressure was down-trending, so he was given 1L IVF, 1 unit of PRBC's and PCCM consulted for admission.  Of note patient was admitted for the same complaint in 06/2022 and underwent EGD with BRTO and required intubation and 5 units of PRBC's.  He followed up in the office in January, but has not since then.    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 Cirrhosis and varices   Significant Hospital Events: Including procedures, antibiotic start and stop dates in addition to other pertinent events   6/6 admit for likely variceal bleeding and borderline hypotension  Interim History / Subjective:  As above  Objective   Blood pressure 116/76, pulse 71, temperature 98.1 F (36.7 C), temperature source Oral, resp. rate 14, height 6\' 2"  (1.88 m), weight 81.6 kg, SpO2 97 %.        Intake/Output Summary (Last 24 hours) at 12/26/2022 1942 Last data filed at 12/26/2022 1759 Gross per 24 hour  Intake 500 ml  Output --  Net 500 ml   Filed Weights   12/26/22 1804  Weight: 81.6 kg     General:  chronically ill and uncomfortable-appearing M in no acute distress HEENT: MM pink/moist, sclera anicteric Neuro: awake, alert and oriented CV: s1s2 rrr, no m/r/g PULM:  clear bilaterally on RA GI: soft, non-tender to palpation Extremities: warm/dry, no edema  Skin: no rashes or lesions  Resolved Hospital Problem list     Assessment & Plan:   UGIB in the setting on known varices ABLA Chronic Thrombocytopenia  POA Known grade II esophageal varices that were not banded during last admission (06/2022) Denies recent ETOH or NSAID use  -admit to ICU for close monitoring -GI consulted by ED -continue octreotide and PPI bid -NPO -repeat CBC after first unit and transfuse for hgb <7 or continued active bleeding -additional L NS   Alcoholic Cirrhosis No apparent ascites, has been taking Lasix and Spironolactone LFT's and coags WNL -hold diuretics in the setting of hypotension -supportive care      Best Practice (right click and "Reselect all SmartList Selections" daily)   Diet/type: NPO DVT prophylaxis: SCD GI prophylaxis: PPI Lines: N/A Foley:  N/A Code Status:  full code Last date of multidisciplinary goals of care discussion [pending, pt ok with being intubated if needed]  Labs   CBC: Recent Labs  Lab 12/26/22 1811  WBC 7.2  NEUTROABS 5.0  HGB 11.9*  HCT 37.2*  MCV 95.9  PLT 113*    Basic Metabolic Panel: Recent Labs  Lab 12/26/22 1811  NA 140  K 4.2  CL 111  CO2 20*  GLUCOSE 117*  BUN 28*  CREATININE 0.73  CALCIUM 8.3*   GFR: Estimated Creatinine Clearance: 126.1 mL/min (by C-G formula based on SCr of 0.73 mg/dL). Recent Labs  Lab 12/26/22 1811  WBC 7.2    Liver Function Tests: Recent Labs  Lab 12/26/22 1811  AST 32  ALT 20  ALKPHOS 58  BILITOT 1.1  PROT 5.5*  ALBUMIN 2.7*   Recent Labs  Lab 12/26/22 1811  LIPASE 28   No results for input(s): "AMMONIA" in the last 168 hours.  ABG    Component Value  Date/Time   TCO2 16 (L) 07/02/2022 2310     Coagulation Profile: Recent Labs  Lab 12/26/22 1810  INR 1.5*    Cardiac Enzymes: No results for input(s): "CKTOTAL", "CKMB", "CKMBINDEX", "TROPONINI" in the last 168 hours.  HbA1C: Hgb A1c MFr Bld  Date/Time Value Ref Range Status  12/28/2021 12:34 PM 4.9 4.8 - 5.6 % Final    Comment:             Prediabetes: 5.7 - 6.4          Diabetes: >6.4          Glycemic control for adults with diabetes: <7.0     CBG: No results for input(s): "GLUCAP" in the last 168 hours.  Review of Systems:   Please see the history of present illness. All other systems reviewed and are negative    Past Medical History:  He,  has a past medical history of Chronic insomnia (05/23/2016), Elevated liver enzymes (04/24/2015), Malignant melanoma of back (HCC) (02/07/2015), Restless legs (05/23/2016), and Seborrheic dermatitis (05/23/2016).   Surgical History:   Past Surgical History:  Procedure Laterality Date   ESOPHAGOGASTRODUODENOSCOPY N/A 07/03/2022   Procedure: ESOPHAGOGASTRODUODENOSCOPY (EGD);  Surgeon: Beverley Fiedler, MD;  Location: [facility] ENDOSCOPY;  Service: Gastroenterology;  Laterality: N/A;   ESOPHAGOGASTRODUODENOSCOPY (EGD) WITH PROPOFOL N/A 05/20/2019   Procedure: ESOPHAGOGASTRODUODENOSCOPY (EGD) WITH PROPOFOL;  Surgeon: Meridee Score Netty Starring., MD;  Location: [facility] ENDOSCOPY;  Service: Gastroenterology;  Laterality: N/A;   Excision Malignant Melanoma Left 02/07/2015   Left upper back   IR ANGIOGRAM SELECTIVE EACH ADDITIONAL VESSEL  07/04/2022   IR EMBO ART  VEN HEMORR LYMPH EXTRAV  INC GUIDE ROADMAPPING  07/04/2022   IR PARACENTESIS  07/18/2022   IR US GUIDE VASC ACCESS RIGHT  07/04/2022   IR VENOGRAM RENAL UNI LEFT  07/04/2022   PARACENTESIS  07/18/2022   RADIOLOGY WITH ANESTHESIA N/A 07/04/2022   Procedure: IR WITH ANESTHESIA - TIPS;  Surgeon: Radiologist, Medication, MD;  Location: [facility] OR;  Service: Radiology;  Laterality: N/A;     Social  History:   reports that he has been smoking cigarettes. He has a 10.00 pack-year smoking history. He has never been exposed to tobacco smoke. He has never used smokeless tobacco. He reports that he does not currently use alcohol after a past usage of about 4.0 standard drinks of alcohol per week. He reports that he does not use drugs.   Family History:  His family history includes Fibromyalgia in his mother.   Allergies No Known Allergies   Home Medications  Prior to Admission medications   Medication Sig Start Date End Date Taking? Authorizing Provider  carvedilol (COREG) 3.125 MG tablet TAKE 1 TABLET BY MOUTH IN THE MORNING, and TAKE 2 TABLETS AT BEDTIME 12/16/22   Julieanne Manson, MD  escitalopram (LEXAPRO) 5 MG tablet Take 1 tablet (5 mg total) by  mouth daily. 11/18/22   Julieanne Manson, MD  ferrous gluconate (FERGON) 324 MG tablet Take 324 mg by mouth daily with breakfast.    [provider]  furosemide (LASIX) 20 MG tablet 1 tab by mouth daily in morning with Spironolactone 07/11/22   Julieanne Manson, MD  Multiple Vitamin (MULTIVITAMIN WITH MINERALS) TABS tablet Take 1 tablet by mouth daily. 05/23/19   Calvert Cantor, MD  pantoprazole (PROTONIX) 40 MG tablet Take 1 tablet (40 mg total) by mouth 2 (two) times daily. 07/11/22   Julieanne Manson, MD  spironolactone (ALDACTONE) 50 MG tablet 1 tab by mouth in morning with furosemide 07/11/22   Julieanne Manson, MD  tamsulosin (FLOMAX) 0.4 MG CAPS capsule Take 1 capsule (0.4 mg total) by mouth daily after supper. 07/11/22   Julieanne Manson, MD     Critical care time:  35 minutes     CRITICAL CARE Performed by: Darcella Gasman Hazem Kenner   Total critical care time: 35 minutes  Critical care time was exclusive of separately billable procedures and treating other patients.  Critical care was necessary to treat or prevent imminent or life-threatening deterioration.  Critical care was time spent personally by me on the  following activities: development of treatment plan with patient and/or surrogate as well as nursing, discussions with consultants, evaluation of patient's response to treatment, examination of patient, obtaining history from patient or surrogate, ordering and performing treatments and interventions, ordering and review of laboratory studies, ordering and review of radiographic studies, pulse oximetry and re-evaluation of patient's condition.   Darcella Gasman Errick Salts, PA-C [facility] See Amion for pager If no response to pager , please call [phone] until 7pm After 7:00 pm call Elink  [phone]

## 2022-12-26 NOTE — ED Notes (Addendum)
Pt called out and says that he vomited blood again. Total volume of emesis is . Renae Fickle, RN and Greta Doom, PA-C aware.

## 2022-12-26 NOTE — Procedures (Signed)
Intubation Procedure Note  Lovel Hee  [id number]  1970/10/17  Date:12/26/22  Time:9:39 PM   Provider Performing:Eleanor Dimichele R Marda Breidenbach    Procedure: Intubation (31500)  Indication(s) Respiratory Failure  Consent Risks of the procedure as well as the alternatives and risks of each were explained to the patient and/or caregiver.  Consent for the procedure was obtained and is signed in the bedside chart   Anesthesia Etomidate and Rocuronium   Time Out Verified patient identification, verified procedure, site/side was marked, verified correct patient position, special equipment/implants available, medications/allergies/relevant history reviewed, required imaging and test results available.   Sterile Technique Usual hand hygeine, masks, and gloves were used   Procedure Description Patient positioned in bed supine.  Sedation given as noted above.  Patient was intubated with endotracheal tube using Glidescope.  View was Grade 1 full glottis .  Number of attempts was  2 .  Colorimetric CO2 detector was consistent with tracheal placement.   Complications/Tolerance None; patient tolerated the procedure well. Chest X-ray is ordered to verify placement.   EBL Minimal   Specimen(s) None   Darcella Gasman Handy Mcloud, PA-C

## 2022-12-27 ENCOUNTER — Inpatient Hospital Stay (HOSPITAL_COMMUNITY): Payer: Medicaid Other

## 2022-12-27 ENCOUNTER — Other Ambulatory Visit: Payer: Self-pay

## 2022-12-27 ENCOUNTER — Encounter (HOSPITAL_COMMUNITY): Payer: Self-pay | Admitting: Internal Medicine

## 2022-12-27 DIAGNOSIS — K922 Gastrointestinal hemorrhage, unspecified: Secondary | ICD-10-CM | POA: Diagnosis not present

## 2022-12-27 DIAGNOSIS — I8511 Secondary esophageal varices with bleeding: Secondary | ICD-10-CM | POA: Diagnosis not present

## 2022-12-27 DIAGNOSIS — F1010 Alcohol abuse, uncomplicated: Secondary | ICD-10-CM

## 2022-12-27 DIAGNOSIS — D696 Thrombocytopenia, unspecified: Secondary | ICD-10-CM

## 2022-12-27 DIAGNOSIS — K920 Hematemesis: Secondary | ICD-10-CM | POA: Diagnosis not present

## 2022-12-27 DIAGNOSIS — D62 Acute posthemorrhagic anemia: Secondary | ICD-10-CM | POA: Diagnosis not present

## 2022-12-27 DIAGNOSIS — K7460 Unspecified cirrhosis of liver: Secondary | ICD-10-CM | POA: Diagnosis not present

## 2022-12-27 DIAGNOSIS — I8510 Secondary esophageal varices without bleeding: Secondary | ICD-10-CM

## 2022-12-27 DIAGNOSIS — Z978 Presence of other specified devices: Secondary | ICD-10-CM

## 2022-12-27 LAB — BASIC METABOLIC PANEL
Anion gap: 10 (ref 5–15)
BUN: 24 mg/dL — ABNORMAL HIGH (ref 6–20)
CO2: 19 mmol/L — ABNORMAL LOW (ref 22–32)
Calcium: 8.3 mg/dL — ABNORMAL LOW (ref 8.9–10.3)
Chloride: 111 mmol/L (ref 98–111)
Creatinine, Ser: 0.67 mg/dL (ref 0.61–1.24)
GFR, Estimated: >60 mL/min (ref >60)
Glucose, Bld: 138 mg/dL — ABNORMAL HIGH (ref 70–99)
Potassium: 5 mmol/L (ref 3.5–5.1)
Sodium: 140 mmol/L (ref 135–145)

## 2022-12-27 LAB — TYPE AND SCREEN
Unit division: 0
Unit division: 0

## 2022-12-27 LAB — CBC
HCT: 39.5 % (ref 39.0–52.0)
HCT: 33.7 % — ABNORMAL LOW (ref 39.0–52.0)
Hemoglobin: 12.6 g/dL — ABNORMAL LOW (ref 13.0–17.0)
Hemoglobin: 11.2 g/dL — ABNORMAL LOW (ref 13.0–17.0)
MCH: 30.7 pg (ref 26.0–34.0)
MCH: 30.4 pg (ref 26.0–34.0)
MCHC: 31.9 g/dL (ref 30.0–36.0)
MCHC: 33.2 g/dL (ref 30.0–36.0)
MCV: 96.3 fL (ref 80.0–100.0)
MCV: 91.6 fL (ref 80.0–100.0)
Platelets: 136 10*3/uL — ABNORMAL LOW (ref 150–400)
Platelets: 99 10*3/uL — ABNORMAL LOW (ref 150–400)
RBC: 4.1 MIL/uL — ABNORMAL LOW (ref 4.22–5.81)
RBC: 3.68 MIL/uL — ABNORMAL LOW (ref 4.22–5.81)
RDW: 17.1 % — ABNORMAL HIGH (ref 11.5–15.5)
RDW: 17.2 % — ABNORMAL HIGH (ref 11.5–15.5)
WBC: 12.6 10*3/uL — ABNORMAL HIGH (ref 4.0–10.5)
WBC: 19.3 10*3/uL — ABNORMAL HIGH (ref 4.0–10.5)
nRBC: 0 % (ref 0.0–0.2)
nRBC: 0 % (ref 0.0–0.2)

## 2022-12-27 LAB — POCT I-STAT 7, (LYTES, BLD GAS, ICA,H+H)
Acid-base deficit: 5 mmol/L — ABNORMAL HIGH (ref 0.0–2.0)
Bicarbonate: 20.3 mmol/L (ref 20.0–28.0)
Calcium, Ion: 1.2 mmol/L (ref 1.15–1.40)
HCT: 30 % — ABNORMAL LOW (ref 39.0–52.0)
Hemoglobin: 10.2 g/dL — ABNORMAL LOW (ref 13.0–17.0)
O2 Saturation: 100 %
Patient temperature: 98.1
Potassium: 5 mmol/L (ref 3.5–5.1)
Sodium: 142 mmol/L (ref 135–145)
TCO2: 21 mmol/L — ABNORMAL LOW (ref 22–32)
pCO2 arterial: 38.2 mmHg (ref 32–48)
pH, Arterial: 7.332 — ABNORMAL LOW (ref 7.35–7.45)
pO2, Arterial: 590 mmHg — ABNORMAL HIGH (ref 83–108)

## 2022-12-27 LAB — GLUCOSE, CAPILLARY
Glucose-Capillary: 105 mg/dL — ABNORMAL HIGH (ref 70–99)
Glucose-Capillary: 107 mg/dL — ABNORMAL HIGH (ref 70–99)
Glucose-Capillary: 112 mg/dL — ABNORMAL HIGH (ref 70–99)
Glucose-Capillary: 115 mg/dL — ABNORMAL HIGH (ref 70–99)
Glucose-Capillary: 127 mg/dL — ABNORMAL HIGH (ref 70–99)
Glucose-Capillary: 150 mg/dL — ABNORMAL HIGH (ref 70–99)
Glucose-Capillary: 95 mg/dL (ref 70–99)

## 2022-12-27 LAB — HEMOGLOBIN A1C
Hgb A1c MFr Bld: 5 % (ref 4.8–5.6)
Mean Plasma Glucose: 96.8 mg/dL

## 2022-12-27 LAB — MRSA NEXT GEN BY PCR, NASAL: MRSA by PCR Next Gen: NOT DETECTED

## 2022-12-27 LAB — BPAM RBC
Blood Product Expiration Date: 202406262359
Unit Type and Rh: 5100
Unit Type and Rh: 5100

## 2022-12-27 LAB — HEMOGLOBIN AND HEMATOCRIT, BLOOD
HCT: 34.7 % — ABNORMAL LOW (ref 39.0–52.0)
Hemoglobin: 11.5 g/dL — ABNORMAL LOW (ref 13.0–17.0)

## 2022-12-27 LAB — APTT: aPTT: 32 seconds (ref 24–36)

## 2022-12-27 LAB — PROTIME-INR
INR: 1.4 — ABNORMAL HIGH (ref 0.8–1.2)
Prothrombin Time: 17.6 seconds — ABNORMAL HIGH (ref 11.4–15.2)

## 2022-12-27 LAB — MAGNESIUM: Magnesium: 1.5 mg/dL — ABNORMAL LOW (ref 1.7–2.4)

## 2022-12-27 LAB — PHOSPHORUS: Phosphorus: 4 mg/dL (ref 2.5–4.6)

## 2022-12-27 MED ORDER — PANTOPRAZOLE SODIUM 40 MG IV SOLR
40.0000 mg | Freq: Two times a day (BID) | INTRAVENOUS | Status: DC
  Administered 2022-12-27 – 2022-12-31 (×9): 40 mg via INTRAVENOUS
  Filled 2022-12-27 (×9): qty 10

## 2022-12-27 MED ORDER — MIDAZOLAM HCL 2 MG/2ML IJ SOLN
2.0000 mg | INTRAMUSCULAR | Status: DC | PRN
  Filled 2022-12-27: qty 2

## 2022-12-27 MED ORDER — LACTATED RINGERS IV SOLN: INTRAVENOUS | Status: AC

## 2022-12-27 MED ORDER — NOREPINEPHRINE 16 MG/250ML-% IV SOLN
2.0000 ug/min | INTRAVENOUS | Status: DC
  Administered 2022-12-27: 12 ug/min via INTRAVENOUS
  Filled 2022-12-27: qty 250

## 2022-12-27 MED ORDER — MAGNESIUM SULFATE 4 GM/100ML IV SOLN
4.0000 g | Freq: Once | INTRAVENOUS | Status: AC
  Administered 2022-12-27: 4 g via INTRAVENOUS
  Filled 2022-12-27: qty 100

## 2022-12-27 MED ORDER — ACETAMINOPHEN 325 MG PO TABS
650.0000 mg | ORAL_TABLET | Freq: Four times a day (QID) | ORAL | Status: DC | PRN
  Administered 2022-12-28 (×2): 650 mg via ORAL
  Filled 2022-12-27 (×3): qty 2

## 2022-12-27 MED ORDER — SODIUM BICARBONATE 8.4 % IV SOLN
50.0000 meq | Freq: Once | INTRAVENOUS | Status: AC
  Administered 2022-12-27: 50 meq via INTRAVENOUS
  Filled 2022-12-27: qty 50

## 2022-12-27 MED ORDER — SODIUM CHLORIDE 0.9% FLUSH: 10.0000 mL | INTRAVENOUS | Status: DC | PRN

## 2022-12-27 MED ORDER — ALBUMIN HUMAN 5 % IV SOLN
25.0000 g | Freq: Once | INTRAVENOUS | Status: AC
  Administered 2022-12-27: 25 g via INTRAVENOUS
  Filled 2022-12-27: qty 500

## 2022-12-27 MED ORDER — SODIUM CHLORIDE 0.9% FLUSH
10.0000 mL | Freq: Two times a day (BID) | INTRAVENOUS | Status: DC
  Administered 2022-12-27 – 2022-12-28 (×3): 10 mL
  Administered 2022-12-29: 30 mL
  Administered 2022-12-29 – 2023-01-04 (×11): 10 mL

## 2022-12-27 MED ORDER — ORAL CARE MOUTH RINSE: 15.0000 mL | OROMUCOSAL | Status: DC | PRN

## 2022-12-27 MED ORDER — LACTATED RINGERS IV BOLUS
1000.0000 mL | Freq: Once | INTRAVENOUS | Status: AC
  Administered 2022-12-27: 1000 mL via INTRAVENOUS

## 2022-12-27 MED ORDER — CHLORHEXIDINE GLUCONATE CLOTH 2 % EX PADS
6.0000 | MEDICATED_PAD | Freq: Every day | CUTANEOUS | Status: DC
  Administered 2022-12-27 – 2023-01-04 (×8): 6 via TOPICAL

## 2022-12-27 NOTE — Progress Notes (Signed)
[facility] ADULT ICU REPLACEMENT PROTOCOL   The patient does apply for the [facility] Adult ICU Electrolyte Replacment Protocol based on the criteria listed below:   1.Exclusion criteria: TCTS, ECMO, Dialysis, and Myasthenia Gravis patients 2. Is GFR >/= 30 ml/min? Yes.    Patient's GFR today is >60 3. Is SCr </= 2? Yes.   Patient's SCr is 0.67 mg/dL 4. Did SCr increase >/= 0.5 in 24 hours? No. 5.Pt's weight >40kg  Yes.   6. Abnormal electrolyte(s): mag 1.5  7. Electrolytes replaced per protocol 8.  Call MD STAT for K+ </= 2.5, Phos </= 1, or Mag </= 1 Physician:  protocol  Melvern Banker 12/27/2022 3:45 AM

## 2022-12-27 NOTE — Op Note (Signed)
[facility] Patient Name: Stephen Miranda Procedure Date : 12/26/2022 MRN: [id number] Attending MD: Particia Lather , , [id number] Date of Birth: [birth year] CSN: [id number] Age: 52 Admit Type: Inpatient Procedure:                Upper GI endoscopy Indications:              Hematemesis Providers:                Madelyn Brunner" Carlos Levering, RN, Rozetta Nunnery, Technician Referring MD:             Herold Harms Medicines:                Sedation per ICU team Complications:            No immediate complications. Estimated Blood Loss:     Estimated blood loss was minimal. Procedure:                Pre-Anesthesia Assessment:                           - Prior to the procedure, a History and Physical                            was performed, and patient medications and                            allergies were reviewed. The patient's tolerance of                            previous anesthesia was also reviewed. The risks                            and benefits of the procedure and the sedation                            options and risks were discussed with the patient.                            All questions were answered, and informed consent                            was obtained. Prior Anticoagulants: The patient has                            taken no anticoagulant or antiplatelet agents. ASA                            Grade Assessment: III - A patient with severe                            systemic disease. After reviewing the risks and  benefits, the patient was deemed in satisfactory                            condition to undergo the procedure.                           After obtaining informed consent, the endoscope was                            passed under direct vision. Throughout the                            procedure, the patient's blood pressure, pulse, and                            oxygen  saturations were monitored continuously. The                            [id number] ([id number]) Therapeutic endoscope was                            introduced through the mouth, and advanced to the                            second part of duodenum. The upper GI endoscopy was                            accomplished without difficulty. The patient                            tolerated the procedure well. Scope In: Scope Out: Findings:      Large (> 5 mm) varices with stigmata of recent bleeding (overlying       ulceration) were found in the distal esophagus. Red wale signs were       present. Five bands were successfully placed with complete eradication,       resulting in deflation of varices. There was no bleeding at the end of       the procedure.      A hiatal hernia was present.      Clotted blood was found in the gastric fundus and in the gastric body.       This was partially cleared with reasonable visualization of the gastric       fundus. There was no reaccumulation of blood seen.      A large amount of food (residue) was found in the gastric fundus and in       the gastric body. This was able to be partially cleared.      A single small sessile polyp with no bleeding was found in the duodenal       bulb. Impression:               - Large (> 5 mm) esophageal varices with stigmata                            of recent bleeding. Completely eradicated. Banded.                           -  Hiatal hernia.                           - Clotted blood in the gastric fundus and in the                            gastric body.                           - A large amount of food (residue) in the stomach.                           - A single duodenal polyp.                           - No specimens collected. Recommendation:           - Return patient to ICU for ongoing care.                           - It is suspected that his hematemesis was due to                            an esophageal  varices bleed.                           - Continue IV PPI gtt for now                           - Continue octreotide gtt for at least 72 hours                           - Continue CTX for 7 days for SBP prophylaxis (can                            be transitioned to an oral antibiotic)                           - Keep NPO                           - Avoid NGT/OGT placement for now                           - Continue to trend hemoglobin.                           - Patient will need repeat EGD in 4 weeks as part                            of variceal banding protocol.                           - The findings and recommendations were discussed  with the patient's mother. Procedure Code(s):        --- Professional ---                           [phone], Esophagogastroduodenoscopy, flexible,                            transoral; with band ligation of esophageal/gastric                            varices Diagnosis Code(s):        --- Professional ---                           I85.01, Esophageal varices with bleeding                           K44.9, Diaphragmatic hernia without obstruction or                            gangrene                           K92.2, Gastrointestinal hemorrhage, unspecified                           K31.7, Polyp of stomach and duodenum                           K92.0, Hematemesis CPT copyright 2022 [employer]. All rights reserved. The codes documented in this report are preliminary and upon coder review may  be revised to meet current compliance requirements. Dr Particia Lather "Alan Ripper" Leonides Schanz,  12/27/2022 12:05:20 AM Number of Addenda: 0

## 2022-12-27 NOTE — Progress Notes (Signed)
NAME:  Stephen Miranda, MRN:  [id number], DOB:  06-28-1971, LOS: 1 ADMISSION DATE:  12/26/2022, CONSULTATION DATE:  12/27/22 REFERRING MD:  EDP, CHIEF COMPLAINT:  hematemesis    History of Present Illness:  Stephen Miranda is a 52 y.o. M with PMH significant for prior ETOH use with known grade II esophageal varices and cirrhosis who presents with the onset of hematemesis today.  He reports no recent alcohol intake and does not use NSAIDS.  No upper abdominal pain or recent n/v.   He had the sudden onset of vomiting dark red blood about one hour before arrival.  He was not in distress and was hemodynamically stable with a Hgb of 11.9 and platelets of 113 without abnormal coags, however he had two additional episodes of hematemesis of 500cc and blood pressure was down-trending, so he was given 1L IVF, 1 unit of PRBC's and PCCM consulted for admission.  Of note patient was admitted for the same complaint in 06/2022 and underwent EGD with BRTO and required intubation and 5 units of PRBC's.  He followed up in the office in January, but has not since then.    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 Cirrhosis and varices   Significant Hospital Events: Including procedures, antibiotic start and stop dates in addition to other pertinent events   6/6 admit for likely variceal bleeding and borderline hypotension, EGD w large varices, 5 bands  6/7 hopeful extubation   Interim History / Subjective:  Looks like NE req incr around 4a   Objective   Blood pressure 93/66, pulse 68, temperature 97.6 F (36.4 C), resp. rate 18, height 6\' 2"  (1.88 m), weight 86.1 kg, SpO2 99 %.    Vent Mode: PSV;CPAP FiO2 (%):  [40 %-100 %] 40 % Set Rate:  [18 bmp] 18 bmp Vt Set:  [660 mL] 660 mL PEEP:  [5 cmH20] 5 cmH20 Pressure Support:  [5  cmH20] 5 cmH20 Plateau Pressure:  [12 cmH20-15 cmH20] 15 cmH20   Intake/Output Summary (Last 24 hours) at 12/27/2022 0915 Last data filed at 12/27/2022 0900 Gross per 24 hour  Intake 2166.39 ml  Output 500 ml  Net 1666.39 ml   Filed Weights   12/26/22 1804 12/26/22 2100 12/27/22 0410  Weight: 81.6 kg 86.1 kg 86.1 kg    General:  critically ill appearing thin middle aged M intubated sedated NAD HEENT: NCAT ETT secure dried blood nares  Neuro: sedate but awakens to voice following commands CV: s1s2 no rgm  PULM:  CTAb on PSV  GI: soft thin ndnt Extremities: no acute joint deformity no cyanosis or clubbing  Skin: pale c/d/w  Resolved Hospital Problem list     Assessment & Plan:   Endotracheally intubated  UGIB due to variceal bleed, s/p banding 12/26/22 ABLA AoC thrombocytopenia  Coagulopathy, mild  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  -holding home spiro, lasix, coreg resume as stabilizes (cautious with coreg, looks like there have been some discussions outpt regarding tolerance)  -H/H at noon and CBC in AM; transfuse PRN  Best Practice (right click and "Reselect all SmartList Selections" daily)   Diet/type: NPO DVT prophylaxis: SCD GI prophylaxis: PPI Lines: N/A Foley:  N/A Code Status:  full code Last date of multidisciplinary goals of care discussion [pending, pt ok with being intubated if needed]  Labs   CBC: Recent Labs  Lab 12/26/22 1811 12/27/22 0016 12/27/22 0245   WBC 7.2  --  12.6*  NEUTROABS 5.0  --   --   HGB 11.9* 10.2* 12.6*  HCT 37.2* 30.0* 39.5  MCV 95.9  --  96.3  PLT 113*  --  136*    Basic Metabolic Panel: Recent Labs  Lab 12/26/22 1811 12/27/22 0016 12/27/22 0245  NA 140 142 140  K 4.2 5.0 5.0  CL 111  --  111  CO2 20*  --  19*  GLUCOSE 117*  --  138*  BUN 28*  --  24*  CREATININE 0.73  --  0.67  CALCIUM 8.3*  --  8.3*  MG  --   --  1.5*  PHOS  --   --  4.0   GFR: Estimated Creatinine Clearance: 127 mL/min (by C-G formula based on SCr of 0.67 mg/dL). Recent Labs  Lab 12/26/22 1811 12/27/22 0245  WBC 7.2 12.6*    Liver Function Tests: Recent Labs  Lab 12/26/22 1811  AST 32  ALT 20  ALKPHOS 58  BILITOT 1.1  PROT 5.5*  ALBUMIN 2.7*   Recent Labs  Lab 12/26/22 1811  LIPASE 28   No results for input(s): "AMMONIA" in the last 168 hours.  ABG    Component Value Date/Time   PHART 7.332 (L) 12/27/2022 0016   PCO2ART 38.2 12/27/2022 0016   PO2ART 590 (H) 12/27/2022 0016   HCO3 20.3 12/27/2022 0016   TCO2 21 (L) 12/27/2022 0016   ACIDBASEDEF 5.0 (H) 12/27/2022 0016   O2SAT 100 12/27/2022 0016     Coagulation Profile: Recent Labs  Lab 12/26/22 1810 12/27/22 0245  INR 1.5* 1.4*    Cardiac Enzymes: No results for input(s): "CKTOTAL", "CKMB", "CKMBINDEX", "TROPONINI" in the last 168 hours.  HbA1C: Hgb A1c MFr Bld  Date/Time Value Ref Range Status  12/27/2022 02:41 AM 5.0 4.8 - 5.6 % Final    Comment:    (NOTE) Pre diabetes:          5.7%-6.4%  Diabetes:              >6.4%  Glycemic control for   <7.0% adults with diabetes   12/28/2021 12:34 PM 4.9 4.8 - 5.6 % Final    Comment:             Prediabetes: 5.7 - 6.4          Diabetes: >6.4          Glycemic control for adults with diabetes: <7.0     CBG: Recent Labs  Lab 12/27/22 0015 12/27/22 0420  GLUCAP 150* 127*   CRITICAL CARE Performed by: Lanier Clam   Total critical care time: 38 minutes  Critical care time was  exclusive of separately billable procedures and treating other patients. Critical care was necessary to treat or prevent imminent or life-threatening deterioration.  Critical care was time spent personally by me on the following activities: development  of treatment plan with patient and/or surrogate as well as nursing, discussions with consultants, evaluation of patient's response to treatment, examination of patient, obtaining history from patient or surrogate, ordering and performing treatments and interventions, ordering and review of laboratory studies, ordering and review of radiographic studies, pulse oximetry and re-evaluation of patient's condition.  Tessie Fass MSN, AGACNP-BC [facility] Pulmonary/Critical Care Medicine Amion for pager 12/27/2022, 9:15 AM

## 2022-12-27 NOTE — Progress Notes (Signed)
eLink Physician-Brief Progress Note Patient Name: Stephen Miranda DOB: 27-Nov-1970 MRN: [id number]   Date of Service  12/27/2022  HPI/Events of Note  52 year old male with a history of alcohol use disorder complicated by cirrhosis and esophageal varices who presents to the hospital with hematemesis and hemorrhagic shock. Admitted to the ICU for management, intubated in the setting of respiratory failure.   Status post EGD, off vasopressors, off ventilatory support.  Complaining of pain and headache  eICU Interventions  Add acetaminophen as needed, do not exceed 3 g from all sources in 24 hours     Intervention Category Minor Interventions: Routine modifications to care plan (e.g. PRN medications for pain, fever)  Emelda Kohlbeck 12/27/2022, 8:40 PM

## 2022-12-27 NOTE — Progress Notes (Signed)
Peripherally Inserted Central Catheter Placement  The IV Nurse has discussed with the patient and/or persons authorized to consent for the patient, the purpose of this procedure and the potential benefits and risks involved with this procedure.  The benefits include less needle sticks, lab draws from the catheter, and the patient may be discharged home with the catheter. Risks include, but not limited to, infection, bleeding, blood clot (thrombus formation), and puncture of an artery; nerve damage and irregular heartbeat and possibility to perform a PICC exchange if needed/ordered by physician.  Alternatives to this procedure were also discussed.  Bard Power PICC patient education guide, fact sheet on infection prevention and patient information card has been provided to patient /or left at bedside.    PICC Placement Documentation  PICC Triple Lumen 12/27/22 Right Brachial 40 cm 0 cm (Active)  Indication for Insertion or Continuance of Line Vasoactive infusions 12/27/22 1855  Exposed Catheter (cm) 0 cm 12/27/22 1855  Site Assessment Clean, Dry, Intact 12/27/22 1855  Lumen #1 Status Flushed;Saline locked;Blood return noted 12/27/22 1855  Lumen #2 Status Flushed;Saline locked;Blood return noted 12/27/22 1855  Lumen #3 Status Flushed;Saline locked;Blood return noted 12/27/22 1855  Dressing Type Transparent;Securing device 12/27/22 1855  Dressing Status Antimicrobial disc in place;Clean, Dry, Intact 12/27/22 1855  Safety Lock Intact 12/27/22 1855  Line Adjustment (NICU/IV Team Only) No 12/27/22 1855  Dressing Intervention New dressing;Other (Comment) 12/27/22 1855  Dressing Change Due 01/03/23 12/27/22 1855       Reginia Forts Albarece 12/27/2022, 6:57 PM

## 2022-12-27 NOTE — Procedures (Signed)
Extubation Procedure Note  Patient Details:   Name: Stephen Miranda DOB: June 20, 1971 MRN: [id number]   Airway Documentation:    Vent end date: 12/27/22 Vent end time: 0818   Evaluation  O2 sats: stable throughout Complications: No apparent complications Patient did tolerate procedure well. Bilateral Breath Sounds: Clear   Yes  Pt was extubated per Md order and placed on 3 L [place]. Cuff leak was noted prior to extubation and no stridor post. Pt is stable at this time. RT will monitor.   Merlene Laughter 12/27/2022, 8:20 AM

## 2022-12-27 NOTE — Progress Notes (Addendum)
Patient ID: Stephen Miranda, male   DOB: August 11, 1970, 52 y.o.   MRN: [id number]    Progress Note   Subjective   Day # 1  CC;Hematemesis  Octreotide PPI Norepinephrine  EGD last PM-large/greater than 5 mm varices with stigmata of recent bleeding/overlying ulceration in the distal esophagus, 5 bands successfully placed with eradication-large amount of food/residue in the gastric fundus gastric body, single small sessile polyp with no bleeding in the duodenal bulb  Last labs-WBC 12.6/hemoglobin 12.6//hematocrit 39.5/platelets 136 Sodium 140/potassium 5.0/BUN 20/creatinine 0.67 INR 1.4/pro time 17.6  Patient has been hemodynamically stable, extubated, discussed with patient's nurse, no further hematemesis and no melena this morning.  Patient is sedated, did not try to awaken, weaned Precedex   Objective   Vital signs in last 24 hours: Temp:  [97.6 F (36.4 C)-99 F (37.2 C)] 97.6 F (36.4 C) (06/07 0818) Pulse Rate:  [52-133] 68 (06/07 0700) Resp:  [0-35] 18 (06/07 0700) BP: (56-136)/(40-83) 93/66 (06/07 0700) SpO2:  [94 %-100 %] 99 % (06/07 0700) FiO2 (%):  [40 %-100 %] 40 % (06/07 0759) Weight:  [81.6 kg-86.1 kg] 86.1 kg (06/07 0410) Last BM Date : 12/27/22 General:   Older white male in NAD, somnolent Heart:  Regular rate and rhythm; no murmurs Lungs: Respirations even and unlabored, lungs CTA bilaterally Abdomen:  Soft, nontender and nondistended. Normal bowel sounds. Extremities:  Without edema. Neurologic: Somnolent   Intake/Output from previous day: 06/06 0701 - 06/07 0700 In: 1868 [I.V.:1008.8; Blood:724; IV Piggyback:135.3] Out: -  Intake/Output this shift: Total I/O In: 298.4 [I.V.:233.7; IV Piggyback:64.7] Out: 500 [Urine:500]  Lab Results: Recent Labs    12/26/22 1811 12/27/22 0016 12/27/22 0245  WBC 7.2  --  12.6*  HGB 11.9* 10.2* 12.6*  HCT 37.2* 30.0* 39.5  PLT 113*  --  136*   BMET Recent Labs    12/26/22 1811 12/27/22 0016 12/27/22 0245  NA 140  142 140  K 4.2 5.0 5.0  CL 111  --  111  CO2 20*  --  19*  GLUCOSE 117*  --  138*  BUN 28*  --  24*  CREATININE 0.73  --  0.67  CALCIUM 8.3*  --  8.3*   LFT Recent Labs    12/26/22 1811  PROT 5.5*  ALBUMIN 2.7*  AST 32  ALT 20  ALKPHOS 58  BILITOT 1.1   PT/INR Recent Labs    12/26/22 1810 12/27/22 0245  LABPROT 18.2* 17.6*  INR 1.5* 1.4*    Studies/Results: DG CHEST PORT 1 VIEW  Result Date: 12/27/2022 CLINICAL DATA:  Status post EGD EXAM: PORTABLE CHEST 1 VIEW COMPARISON:  Film from earlier in the same day. FINDINGS: Endotracheal tube is noted in satisfactory position. Cardiac shadow is stable. Changes of prior embolization in the left upper abdomen are seen. The lungs are well aerated bilaterally. No focal infiltrate or effusion is noted. IMPRESSION: No acute abnormality noted. Electronically Signed   By: Alcide Clever M.D.   On: 12/27/2022 00:53   DG CHEST PORT 1 VIEW  Result Date: 12/26/2022 CLINICAL DATA:  Check endotracheal tube placement EXAM: PORTABLE CHEST 1 VIEW COMPARISON:  07/03/2022 FINDINGS: Endotracheal tube is noted 6 cm above the carina. Cardiac shadow is within normal limits. Changes of prior embolotherapy are seen in the left upper abdomen. No focal infiltrate is seen. Bony structures are within normal limits. IMPRESSION: Endotracheal tube as described. No acute abnormality noted. Electronically Signed   By: Alcide Clever M.D.   On:  12/26/2022 22:13       Assessment / Plan:    #74 52 year old white male with EtOH use disorder and previously documented EtOH use cirrhosis who presented last night with hematemesis, associated with hypotension.  Sedated, intubated and emergency EGD was done with finding of large esophageal varices the distal esophagus with red wale signs consistent with stigmata of active bleeding.  He was banded x 5 with complete eradication and hemostasis also noted to have a single duodenal polyp.  He has been stable postprocedure, able to  be extubated this morning.  No further active bleeding  #2 anemia secondary to acute blood loss #3 coagulopathy secondary to cirrhosis #4 thrombocytopenia secondary to cirrhosis #5 history of ascites,-nontense ascites currently #6 EtOH disorder-hide active EtOH use on admission  Plan; continue octreotide x 72 hours total IV PPI twice daily Clear liquids today Serial hemoglobins and transfuse as indicated IV Rocephin He will need repeat EGD in 4 weeks established with Dr. Meridee Score     Principal Problem:   GIB (gastrointestinal bleeding) Active Problems:   Acute upper GI bleed   Bleeding esophageal varices (HCC)     LOS: 1 day   Amy Esterwood PA-C 12/27/2022, 9:03 AM   Attending physician's note   I have taken a history, reviewed the chart and examined the patient. I performed a substantive portion of this encounter, including complete performance of at least one of the key components, in conjunction with the APP. I agree with the APP's note, impression and recommendations.   MELD 3.0: 11 at 12/27/2022  2:45 AM MELD-Na: 11 at 12/27/2022  2:45 AM  S/p EGD last night with esophageal varices, recent stigmata of bleeding s/p variceal band ligation x5 Continue octreotide gtt. for total 72 hours PPI IV twice daily IV ceftriaxone for 5 days Monitor hemoglobin and transfuse if below 7  Monitor for alcohol withdrawal, suspect active daily EtOH use despite patient's denial  The patient was provided an opportunity to ask questions and all were answered. The patient agreed with the plan and demonstrated an understanding of the instructions.   Iona Beard , MD [phone]

## 2022-12-28 DIAGNOSIS — K7460 Unspecified cirrhosis of liver: Secondary | ICD-10-CM | POA: Diagnosis not present

## 2022-12-28 DIAGNOSIS — K922 Gastrointestinal hemorrhage, unspecified: Secondary | ICD-10-CM | POA: Diagnosis not present

## 2022-12-28 DIAGNOSIS — D62 Acute posthemorrhagic anemia: Secondary | ICD-10-CM | POA: Diagnosis not present

## 2022-12-28 DIAGNOSIS — K920 Hematemesis: Secondary | ICD-10-CM | POA: Diagnosis not present

## 2022-12-28 DIAGNOSIS — I8511 Secondary esophageal varices with bleeding: Secondary | ICD-10-CM | POA: Diagnosis not present

## 2022-12-28 LAB — CBC
HCT: 24.9 % — ABNORMAL LOW (ref 39.0–52.0)
HCT: 26.6 % — ABNORMAL LOW (ref 39.0–52.0)
Hemoglobin: 8.6 g/dL — ABNORMAL LOW (ref 13.0–17.0)
Hemoglobin: 8.6 g/dL — ABNORMAL LOW (ref 13.0–17.0)
MCH: 31.4 pg (ref 26.0–34.0)
MCH: 30.6 pg (ref 26.0–34.0)
MCHC: 34.5 g/dL (ref 30.0–36.0)
MCHC: 32.3 g/dL (ref 30.0–36.0)
MCV: 90.9 fL (ref 80.0–100.0)
MCV: 94.7 fL (ref 80.0–100.0)
Platelets: 68 10*3/uL — ABNORMAL LOW (ref 150–400)
Platelets: 47 10*3/uL — ABNORMAL LOW (ref 150–400)
RBC: 2.74 MIL/uL — ABNORMAL LOW (ref 4.22–5.81)
RBC: 2.81 MIL/uL — ABNORMAL LOW (ref 4.22–5.81)
RDW: 16.7 % — ABNORMAL HIGH (ref 11.5–15.5)
RDW: 16.7 % — ABNORMAL HIGH (ref 11.5–15.5)
WBC: 13.5 10*3/uL — ABNORMAL HIGH (ref 4.0–10.5)
WBC: 7.4 10*3/uL (ref 4.0–10.5)
nRBC: 0 % (ref 0.0–0.2)
nRBC: 0 % (ref 0.0–0.2)

## 2022-12-28 LAB — BPAM RBC
Blood Product Expiration Date: 202406242359
Blood Product Expiration Date: 202407092359
ISSUE DATE / TIME: 202406070207
Unit Type and Rh: 5100

## 2022-12-28 LAB — TYPE AND SCREEN: Unit division: 0

## 2022-12-28 LAB — HEMOGLOBIN AND HEMATOCRIT, BLOOD
HCT: 23.1 % — ABNORMAL LOW (ref 39.0–52.0)
Hemoglobin: 7.7 g/dL — ABNORMAL LOW (ref 13.0–17.0)

## 2022-12-28 LAB — GLUCOSE, CAPILLARY
Glucose-Capillary: 100 mg/dL — ABNORMAL HIGH (ref 70–99)
Glucose-Capillary: 99 mg/dL (ref 70–99)
Glucose-Capillary: 105 mg/dL — ABNORMAL HIGH (ref 70–99)
Glucose-Capillary: 88 mg/dL (ref 70–99)
Glucose-Capillary: 98 mg/dL (ref 70–99)

## 2022-12-28 LAB — BASIC METABOLIC PANEL
Anion gap: 10 (ref 5–15)
BUN: 16 mg/dL (ref 6–20)
CO2: 22 mmol/L (ref 22–32)
Calcium: 7.6 mg/dL — ABNORMAL LOW (ref 8.9–10.3)
Chloride: 105 mmol/L (ref 98–111)
Creatinine, Ser: 0.81 mg/dL (ref 0.61–1.24)
GFR, Estimated: >60 mL/min (ref >60)
Glucose, Bld: 109 mg/dL — ABNORMAL HIGH (ref 70–99)
Potassium: 3.6 mmol/L (ref 3.5–5.1)
Sodium: 137 mmol/L (ref 135–145)

## 2022-12-28 LAB — GAMMA GT: GGT: 20 U/L (ref 7–50)

## 2022-12-28 MED ORDER — MAGIC MOUTHWASH W/LIDOCAINE
10.0000 mL | Freq: Four times a day (QID) | ORAL | Status: DC | PRN
  Administered 2022-12-28 (×3): 10 mL via ORAL
  Filled 2022-12-28 (×4): qty 10

## 2022-12-28 MED ORDER — POTASSIUM CHLORIDE 10 MEQ/50ML IV SOLN
10.0000 meq | INTRAVENOUS | Status: AC
  Administered 2022-12-28 (×4): 10 meq via INTRAVENOUS
  Filled 2022-12-28 (×4): qty 50

## 2022-12-28 NOTE — Progress Notes (Signed)
[facility] ADULT ICU REPLACEMENT PROTOCOL   The patient does apply for the [facility] Adult ICU Electrolyte Replacment Protocol based on the criteria listed below:   1.Exclusion criteria: TCTS, ECMO, Dialysis, and Myasthenia Gravis patients 2. Is GFR >/= 30 ml/min? Yes.    Patient's GFR today is >60 3. Is SCr </= 2? Yes.   Patient's SCr is 0.81 mg/dL 4. Did SCr increase >/= 0.5 in 24 hours? No. 5.Pt's weight >40kg  Yes.   6. Abnormal electrolyte(s): potassium 3.6  7. Electrolytes replaced per protocol 8.  Call MD STAT for K+ </= 2.5, Phos </= 1, or Mag </= 1 Physician:  protocol  Melvern Banker 12/28/2022 2:56 AM

## 2022-12-28 NOTE — Progress Notes (Signed)
   NAME:  Stephen Miranda, MRN:  [id number], DOB:  12-07-1970, LOS: 2 ADMISSION DATE:  12/26/2022, CONSULTATION DATE:  12/28/22 REFERRING MD:  EDP, CHIEF COMPLAINT:  hematemesis    History of Present Illness:  Stephen Miranda is a 52 y.o. M with PMH significant for prior ETOH use with known grade II esophageal varices and cirrhosis who presents with the onset of hematemesis today.  He reports no recent alcohol intake and does not use NSAIDS.  No upper abdominal pain or recent n/v.   He had the sudden onset of vomiting dark red blood about one hour before arrival.  He was not in distress and was hemodynamically stable with a Hgb of 11.9 and platelets of 113 without abnormal coags, however he had two additional episodes of hematemesis of 500cc and blood pressure was down-trending, so he was given 1L IVF, 1 unit of PRBC's and PCCM consulted for admission.  Of note patient was admitted for the same complaint in 06/2022 and underwent EGD with BRTO and required intubation and 5 units of PRBC's.  He followed up in the office in January, but has not since then.    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 Cirrhosis and varices   Significant Hospital Events: Including procedures, antibiotic start and stop dates in addition to other pertinent events   6/6 admit for likely variceal bleeding and borderline hypotension, EGD w large varices, 5 bands  6/7 hopeful extubation   Interim History / Subjective:  Remains on levophed. Some retrosternal discomfort.  Objective   Blood pressure (!) 111/59, pulse 90, temperature 99 F (37.2 C), temperature source Oral, resp. rate 14, height 6\' 2"  (1.88 m), weight 87.2 kg, SpO2 97 %.        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  Hypotension -- medication vs hem shock + chronic component  NAGMA Hx prior GIB s/p BRTO in 2023 w gII gastric varices  -denies ongoing etoh (last use 18yr ago), denies NSAIDS -underwent emergent EGD 6/6, larges varices were banded, no further bleeding  -endorsed compliance w lasix and spiro  -looks like outpt SBP is mid 90s to mid 110s  P - Levophed for MAP 60 - Stop crystalloid - Ceftriaxone for SBP ppx - PPI + octreotide - diet advancement and TIPS eval per GI - Keep in ICU until off pressors  Best Practice (right click and "Reselect all SmartList Selections" daily)   Diet/type clear liquid DVT prophylaxis: SCD GI prophylaxis: PPI Lines: N/A Foley:  N/A Code Status:  full code Last date of multidisciplinary goals of care discussion [n/a]  31 min cc time Myrla Halsted MD PCCM

## 2022-12-28 NOTE — Progress Notes (Addendum)
[facility] ROUNDING NOTE   Subjective: No further bleeding, hemodynamic status is improving, weaning off pressors There is slight decline in hemoglobin   Objective: Vital signs in last 24 hours: Temp:  [98.7 F (37.1 C)-101.5 F (38.6 C)] 99 F (37.2 C) (06/08 0800) Pulse Rate:  [81-107] 90 (06/08 0800) Resp:  [12-25] 14 (06/08 0800) BP: (55-131)/(33-82) 111/59 (06/08 0800) SpO2:  [88 %-100 %] 97 % (06/08 0800) Weight:  [87.2 kg] 87.2 kg (06/08 0500) Last BM Date : 12/27/22 General: NAD, no asterixis Lungs: Bilateral clear Heart: S1-S2 Abdomen: Soft, mild distention, nontender, bowel sounds positive Ext: No edema    Intake/Output from previous day: 06/07 0701 - 06/08 0700 In: 4222.9 [I.V.:2762.8; IV Piggyback:1460.1] Out: 1800 [Urine:1800] Intake/Output this shift: Total I/O In: 137.4 [I.V.:78; IV Piggyback:59.4] Out: -    Lab Results: Recent Labs    12/27/22 0245 12/27/22 1302 12/27/22 1747 12/28/22 0125  WBC 12.6*  --  19.3* 13.5*  HGB 12.6* 11.5* 11.2* 8.6*  PLT 136*  --  99* 68*  MCV 96.3  --  91.6 90.9   BMET Recent Labs    12/26/22 1811 12/27/22 0016 12/27/22 0245 12/28/22 0125  NA 140 142 140 137  K 4.2 5.0 5.0 3.6  CL 111  --  111 105  CO2 20*  --  19* 22  GLUCOSE 117*  --  138* 109*  BUN 28*  --  24* 16  CREATININE 0.73  --  0.67 0.81  CALCIUM 8.3*  --  8.3* 7.6*   LFT Recent Labs    12/26/22 1811  PROT 5.5*  ALBUMIN 2.7*  AST 32  ALT 20  ALKPHOS 58  BILITOT 1.1   PT/INR Recent Labs    12/26/22 1810 12/27/22 0245  INR 1.5* 1.4*      Imaging/Other results: [place] EKG SITE RITE  Result Date: 12/27/2022 If Site Rite image not attached, placement could not be confirmed due to current cardiac rhythm.  DG CHEST PORT 1 VIEW  Result Date: 12/27/2022 CLINICAL DATA:  Status post EGD EXAM: PORTABLE CHEST 1 VIEW COMPARISON:  Film from earlier in the same day. FINDINGS: Endotracheal tube is noted in satisfactory  position. Cardiac shadow is stable. Changes of prior embolization in the left upper abdomen are seen. The lungs are well aerated bilaterally. No focal infiltrate or effusion is noted. IMPRESSION: No acute abnormality noted. Electronically Signed   By: Alcide Clever M.D.   On: 12/27/2022 00:53   DG CHEST PORT 1 VIEW  Result Date: 12/26/2022 CLINICAL DATA:  Check endotracheal tube placement EXAM: PORTABLE CHEST 1 VIEW COMPARISON:  07/03/2022 FINDINGS: Endotracheal tube is noted 6 cm above the carina. Cardiac shadow is within normal limits. Changes of prior embolotherapy are seen in the left upper abdomen. No focal infiltrate is seen. Bony structures are within normal limits. IMPRESSION: Endotracheal tube as described. No acute abnormality noted. Electronically Signed   By: Alcide Clever M.D.   On: 12/26/2022 22:13      Assessment &Plan  52 year old male with history of EtOH cirrhosis decompensated by variceal hemorrhage History of gastric varices s/p BRTO in December 2023 Admitted with recurrent GI bleed from esophageal varices status post esophageal variceal band ligation 12/27/2022 MELD 3.0: 11 at 12/28/2022  1:25 AM MELD-Na: 11 at 12/28/2022  1:25 AM  Early TIPS may benefit given recurrent variceal hemorrhage but at risk for hepatic encephalopathy and TIPS related complications.  He does meet criteria for early TIPS but may limit his  chances for possible liver transplant in the future based on transplant center Will need echocardiogram IR consult to evaluate for TIPS  Patient denies active EtOH use but his presentation is concerning for ongoing EtOH Check GGT PEth test may be useful to identify if he had any alcohol use within the past month, not available to order as inpatient, needs to be done as outpatient  Full liquid diet today and advance to soft diet tomorrow for 1 to 2 weeks Complains of retrosternal discomfort, will add Magic mouthwash with lidocaine 4 times daily before meals and at  bedtime as needed  Octreotide GGT for total 72 hours PPI twice daily Ceftriaxone IV for 5 days and stop  Will schedule for repeat EGD for esophageal varices surveillance and repeat band ligation if needed in 3 to 4 weeks  GI will continue to follow along   K. Scherry Ran , MD [phone]  [facility]

## 2022-12-29 ENCOUNTER — Inpatient Hospital Stay (HOSPITAL_COMMUNITY): Payer: Medicaid Other

## 2022-12-29 ENCOUNTER — Encounter (HOSPITAL_COMMUNITY): Payer: Self-pay | Admitting: Internal Medicine

## 2022-12-29 DIAGNOSIS — K920 Hematemesis: Secondary | ICD-10-CM | POA: Diagnosis not present

## 2022-12-29 DIAGNOSIS — K7460 Unspecified cirrhosis of liver: Secondary | ICD-10-CM | POA: Diagnosis not present

## 2022-12-29 DIAGNOSIS — I8511 Secondary esophageal varices with bleeding: Secondary | ICD-10-CM | POA: Diagnosis not present

## 2022-12-29 DIAGNOSIS — D62 Acute posthemorrhagic anemia: Secondary | ICD-10-CM | POA: Diagnosis not present

## 2022-12-29 DIAGNOSIS — K922 Gastrointestinal hemorrhage, unspecified: Secondary | ICD-10-CM | POA: Diagnosis not present

## 2022-12-29 LAB — CBC
HCT: 22.3 % — ABNORMAL LOW (ref 39.0–52.0)
Hemoglobin: 7.4 g/dL — ABNORMAL LOW (ref 13.0–17.0)
MCH: 30.8 pg (ref 26.0–34.0)
MCHC: 33.2 g/dL (ref 30.0–36.0)
MCV: 92.9 fL (ref 80.0–100.0)
Platelets: 43 10*3/uL — ABNORMAL LOW (ref 150–400)
RBC: 2.4 MIL/uL — ABNORMAL LOW (ref 4.22–5.81)
RDW: 16.5 % — ABNORMAL HIGH (ref 11.5–15.5)
WBC: 5.4 10*3/uL (ref 4.0–10.5)
nRBC: 0 % (ref 0.0–0.2)

## 2022-12-29 LAB — GLUCOSE, CAPILLARY
Glucose-Capillary: 107 mg/dL — ABNORMAL HIGH (ref 70–99)
Glucose-Capillary: 164 mg/dL — ABNORMAL HIGH (ref 70–99)
Glucose-Capillary: 79 mg/dL (ref 70–99)
Glucose-Capillary: 82 mg/dL (ref 70–99)
Glucose-Capillary: 86 mg/dL (ref 70–99)
Glucose-Capillary: 94 mg/dL (ref 70–99)

## 2022-12-29 LAB — HEMOGLOBIN AND HEMATOCRIT, BLOOD
HCT: 23.4 % — ABNORMAL LOW (ref 39.0–52.0)
HCT: 24.8 % — ABNORMAL LOW (ref 39.0–52.0)
Hemoglobin: 7.6 g/dL — ABNORMAL LOW (ref 13.0–17.0)
Hemoglobin: 8.1 g/dL — ABNORMAL LOW (ref 13.0–17.0)

## 2022-12-29 MED ORDER — IOHEXOL 350 MG/ML SOLN
100.0000 mL | Freq: Once | INTRAVENOUS | Status: AC | PRN
  Administered 2022-12-29: 100 mL via INTRAVENOUS

## 2022-12-29 NOTE — Progress Notes (Signed)
PROGRESS NOTE        PATIENT DETAILS Name: Stephen Miranda Age: 52 y.o. Sex: male Date of Birth: [birth year] Admit Date: 12/26/2022 Admitting Physician Cheri Fowler, MD [id number], Lanora Manis, MD  Brief Summary: Patient is a 52 y.o.  male with history of alcoholic liver cirrhosis-presented with hemorrhagic shock in the setting of upper GI bleed with acute blood loss anemia.  Admitted by PCCM to the ICU-required pressors-and numerous units of PRBC.  Evaluated by GI-underwent EGD with banding.  Stabilized and subsequently transferred to [facility] on 6/9.  Significant events: 6/6>> admit to ICU by PCCM-upper GI bleed-with ABLA/hemorrhagic shock.  Intubated for EGD.  EGD with banding. 6/7>> extubated 6/9>> transferred to [facility]  Significant studies: 6/6>> CXR: No acute abnormality  Significant microbiology data: None  Procedures: 6/6>> EGD: Large esophageal varices with stigmata of recent bleeding-banding performed-clotted blood in the gastric fundus. 6/6-6/7 ETT  Consults: None  Subjective: Lying comfortably in bed-denies any chest pain or shortness of breath.  Had some brown-colored stools in the ICU yesterday, none since then.  Objective: Vitals: Blood pressure (!) 91/58, pulse 81, temperature 99 F (37.2 C), temperature source Axillary, resp. rate 20, height 6\' 2"  (1.88 m), weight 87.2 kg, SpO2 97 %.   Exam: Gen Exam:Alert awake-not in any distress HEENT:atraumatic, normocephalic Chest: B/L clear to auscultation anteriorly CVS:S1S2 regular Abdomen:soft non tender, non distended Extremities:no edema Neurology: Non focal Skin: no rash  Pertinent Labs/Radiology:    Latest Ref Rng & Units 12/29/2022    1:47 AM 12/28/2022    5:43 PM 12/28/2022   11:20 AM  CBC  WBC 4.0 - 10.5 K/uL 5.4  7.4    Hemoglobin 13.0 - 17.0 g/dL 7.4  8.6  7.7   Hematocrit 39.0 - 52.0 % 22.3  26.6  23.1   Platelets 150 - 400 K/uL 43  47      Lab Results  Component Value Date   NA 137  12/28/2022   K 3.6 12/28/2022   CL 105 12/28/2022   CO2 22 12/28/2022      Assessment/Plan: Hemorrhagic shock due to upper GI bleeding from variceal bleed Acute blood loss anemia BP soft but stable-brown stools yesterday Hemoglobin slowly trending down-suspect equilibration rather than bleeding Continue octreotide infusion x 72 hours.  Has required 2 units of PRBC so far. Continue PPI Continue to follow CBC closely GI planning to consult IR for TIPS Echo pending.  Alcoholic liver cirrhosis Prior GI bleeding-s/p BRTO in 2023 Thrombocytopenia Coagulopathy Rocephin x 5 days for SBP prophylaxis Diuretics/beta-blocker on hold-as BP soft-May need midodrine-however volume status stable for now. GI planning TIPS-see above  BMI: Estimated body mass index is 24.68 kg/m as calculated from the following:   Height as of this encounter: 6\' 2"  (1.88 m).   Weight as of this encounter: 87.2 kg.   Code status:   Code Status: Full Code   DVT Prophylaxis: SCDs Start: 12/26/22 1959   Family Communication: None at bedside   Disposition Plan: Status is: Inpatient Remains inpatient appropriate because: Severity of illness   Planned Discharge Destination:Home   Diet: Diet Order             Diet full liquid Room service appropriate? Yes; Fluid consistency: Thin  Diet effective now  Antimicrobial agents: Anti-infectives (From admission, onward)    Start     Dose/Rate Route Frequency Ordered Stop   12/26/22 2230  cefTRIAXone (ROCEPHIN) 1 g in sodium chloride 0.9 % 100 mL IVPB        1 g 200 mL/hr over 30 Minutes Intravenous Every 24 hours 12/26/22 2141          MEDICATIONS: Scheduled Meds:  sodium chloride   Intravenous Once   sodium chloride   Intravenous Once   sodium chloride   Intravenous Once   Chlorhexidine Gluconate Cloth  6 each Topical Daily   insulin aspart  0-15 Units Subcutaneous Q4H   pantoprazole (PROTONIX) IV  40 mg Intravenous  Q12H   sodium chloride flush  10-40 mL Intracatheter Q12H   Continuous Infusions:  sodium chloride Stopped (12/28/22 0922)   cefTRIAXone (ROCEPHIN)  IV 1 g (12/28/22 2209)   octreotide (SANDOSTATIN) 500 mcg in sodium chloride 0.9 % 250 mL (2 mcg/mL) infusion 50 mcg/hr (12/29/22 0630)   PRN Meds:.acetaminophen, docusate sodium, magic mouthwash w/lidocaine, ondansetron (ZOFRAN) IV, mouth rinse, polyethylene glycol, sodium chloride flush   I have personally reviewed following labs and imaging studies  LABORATORY DATA: CBC: Recent Labs  Lab 12/26/22 1811 12/27/22 0016 12/27/22 0245 12/27/22 1302 12/27/22 1747 12/28/22 0125 12/28/22 1120 12/28/22 1743 12/29/22 0147  WBC 7.2  --  12.6*  --  19.3* 13.5*  --  7.4 5.4  NEUTROABS 5.0  --   --   --   --   --   --   --   --   HGB 11.9*   < > 12.6*   < > 11.2* 8.6* 7.7* 8.6* 7.4*  HCT 37.2*   < > 39.5   < > 33.7* 24.9* 23.1* 26.6* 22.3*  MCV 95.9  --  96.3  --  91.6 90.9  --  94.7 92.9  PLT 113*  --  136*  --  99* 68*  --  47* 43*   < > = values in this interval not displayed.    Basic Metabolic Panel: Recent Labs  Lab 12/26/22 1811 12/27/22 0016 12/27/22 0245 12/28/22 0125  NA 140 142 140 137  K 4.2 5.0 5.0 3.6  CL 111  --  111 105  CO2 20*  --  19* 22  GLUCOSE 117*  --  138* 109*  BUN 28*  --  24* 16  CREATININE 0.73  --  0.67 0.81  CALCIUM 8.3*  --  8.3* 7.6*  MG  --   --  1.5*  --   PHOS  --   --  4.0  --     GFR: Estimated Creatinine Clearance: 125.4 mL/min (by C-G formula based on SCr of 0.81 mg/dL).  Liver Function Tests: Recent Labs  Lab 12/26/22 1811  AST 32  ALT 20  ALKPHOS 58  BILITOT 1.1  PROT 5.5*  ALBUMIN 2.7*   Recent Labs  Lab 12/26/22 1811  LIPASE 28   No results for input(s): "AMMONIA" in the last 168 hours.  Coagulation Profile: Recent Labs  Lab 12/26/22 1810 12/27/22 0245  INR 1.5* 1.4*    Cardiac Enzymes: No results for input(s): "CKTOTAL", "CKMB", "CKMBINDEX", "TROPONINI" in  the last 168 hours.  BNP (last 3 results) No results for input(s): "PROBNP" in the last 8760 hours.  Lipid Profile: No results for input(s): "CHOL", "HDL", "LDLCALC", "TRIG", "CHOLHDL", "LDLDIRECT" in the last 72 hours.  Thyroid Function Tests: No results for input(s): "TSH", "T4TOTAL", "FREET4", "T3FREE", "  THYROIDAB" in the last 72 hours.  Anemia Panel: No results for input(s): "VITAMINB12", "FOLATE", "FERRITIN", "TIBC", "IRON", "RETICCTPCT" in the last 72 hours.  Urine analysis:    Component Value Date/Time   COLORURINE YELLOW 12/26/2022 2045   APPEARANCEUR CLEAR 12/26/2022 2045   LABSPEC 1.019 12/26/2022 2045   PHURINE 6.0 12/26/2022 2045   GLUCOSEU NEGATIVE 12/26/2022 2045   HGBUR NEGATIVE 12/26/2022 2045   BILIRUBINUR NEGATIVE 12/26/2022 2045   KETONESUR 5 (A) 12/26/2022 2045   PROTEINUR NEGATIVE 12/26/2022 2045   NITRITE NEGATIVE 12/26/2022 2045   LEUKOCYTESUR NEGATIVE 12/26/2022 2045    Sepsis Labs: Lactic Acid, Venous    Component Value Date/Time   LATICACIDVEN 1.7 07/03/2022 9629    MICROBIOLOGY: Recent Results (from the past 240 hour(s))  MRSA Next Gen by PCR, Nasal     Status: None   Collection Time: 12/27/22  9:16 AM   Specimen: Nasal Mucosa; Nasal Swab  Result Value Ref Range Status   MRSA by PCR Next Gen NOT DETECTED NOT DETECTED Final    Comment: (NOTE) The GeneXpert MRSA Assay (FDA approved for NASAL specimens only), is one component of a comprehensive MRSA colonization surveillance program. It is not intended to diagnose MRSA infection nor to guide or monitor treatment for MRSA infections. Test performance is not FDA approved in patients less than 28 years old. Performed at [facility], [address] [place], [place] [address]     RADIOLOGY STUDIES/RESULTS: [place] EKG SITE RITE  Result Date: 12/27/2022 If Site Rite image not attached, placement could not be confirmed due to current cardiac rhythm.    LOS: 3 days   Jeoffrey Massed,  MD  [facility]    To contact the attending provider between 7A-7P or the covering provider during after hours 7P-7A, please log into the web site [email or web address] and access using universal [place] password for that web site. If you do not have the password, please call the hospital operator.  12/29/2022, 8:53 AM

## 2022-12-29 NOTE — Progress Notes (Signed)
Request received to eval for consideration of TIPS procedure. Pt known to IR service S/p BRTO for gastric varices in 06/2022.  Now admitted with bleeding esophageal varices. Successful endoscopic banding with no further bleeding, pt moved out of ICU, tolerating diet.  Chart reviewed. Echo ordered.  Will order new CTA A/P BRTO/TIPS protocol and IR team will formally consult upon review.  Brayton El PA-C Interventional Radiology 12/29/2022 1:00 PM

## 2022-12-29 NOTE — Progress Notes (Addendum)
Patient   Progress Note  Primary GI: Dr. Meridee Score  LOS: 3 days   Chief Complaint:Hematemesis   Subjective   Patient reports no nausea or vomiting. Denies bowel movements. Reports worsening gas pain. Tolerating diet without difficulty.   Objective   Vital signs in last 24 hours: Temp:  [98.6 F (37 C)-99.5 F (37.5 C)] 99 F (37.2 C) (06/09 0400) Pulse Rate:  [75-102] 75 (06/09 0800) Resp:  [13-22] 20 (06/09 0800) BP: (91-107)/(47-64) 92/55 (06/09 0800) SpO2:  [92 %-97 %] 96 % (06/09 0800) Last BM Date : 12/28/22 Last BM recorded by nurses in past 5 days Stool Type: Type 5 (Soft blobs with clear-cut edges) (12/28/2022  4:00 PM)  General:   male in no acute distress  Heart:  Regular rate and rhythm; no murmurs Pulm: Clear anteriorly; no wheezing Abdomen: soft, nondistended, normal bowel sounds in all quadrants. Nontender without guarding. No organomegaly appreciated. Extremities:  No edema Neurologic:  Alert and  oriented x4;  No focal deficits.  Psych:  Cooperative. Normal mood and affect.  Intake/Output from previous day: 06/08 0701 - 06/09 0700 In: 800.5 [I.V.:600.5; IV Piggyback:200] Out: 700 [Urine:700] Intake/Output this shift: No intake/output data recorded.  Studies/Results: [place] EKG SITE RITE  Result Date: 12/27/2022 If Site Rite image not attached, placement could not be confirmed due to current cardiac rhythm.   Lab Results: Recent Labs    12/28/22 0125 12/28/22 1120 12/28/22 1743 12/29/22 0147  WBC 13.5*  --  7.4 5.4  HGB 8.6* 7.7* 8.6* 7.4*  HCT 24.9* 23.1* 26.6* 22.3*  PLT 68*  --  47* 43*   BMET Recent Labs    12/26/22 1811 12/27/22 0016 12/27/22 0245 12/28/22 0125  NA 140 142 140 137  K 4.2 5.0 5.0 3.6  CL 111  --  111 105  CO2 20*  --  19* 22  GLUCOSE 117*  --  138* 109*  BUN 28*  --  24* 16  CREATININE 0.73  --  0.67 0.81  CALCIUM 8.3*  --  8.3* 7.6*   LFT Recent Labs    12/26/22 1811  PROT 5.5*  ALBUMIN 2.7*  AST 32  ALT  20  ALKPHOS 58  BILITOT 1.1   PT/INR Recent Labs    12/26/22 1810 12/27/22 0245  LABPROT 18.2* 17.6*  INR 1.5* 1.4*     Scheduled Meds:  sodium chloride   Intravenous Once   sodium chloride   Intravenous Once   sodium chloride   Intravenous Once   Chlorhexidine Gluconate Cloth  6 each Topical Daily   insulin aspart  0-15 Units Subcutaneous Q4H   pantoprazole (PROTONIX) IV  40 mg Intravenous Q12H   sodium chloride flush  10-40 mL Intracatheter Q12H   Continuous Infusions:  sodium chloride Stopped (12/28/22 0922)   cefTRIAXone (ROCEPHIN)  IV 1 g (12/28/22 2209)   octreotide (SANDOSTATIN) 500 mcg in sodium chloride 0.9 % 250 mL (2 mcg/mL) infusion 50 mcg/hr (12/29/22 0630)      Patient profile:   Stephen Miranda is a 52 y.o. male with history of EtOH cirrhosis with EV, gastric varices s/p BRTO December 2023, Barrett's esophagus, Richter's hernia presented with hematemesis.     Impression:   Recurrent GI bleed from esophageal varices status post esophageal variceal band ligation 12/27/2022  MELD 3.0 11 - hgb 7.4 (8.6) - Platelets 43 - GGT 20 Mild drop in hgb without overt bleeding. Possibly hemodilution since patient is receiving IVF.     Plan:   -  Check CDT and Peth tests as outpatient (not available as inpatient) - Continue daily CBC and transfuse as needed to maintain HGB > 7  - Continue Octreotide for total 72 hrs - Continue PPI BID - Complete ceftriaxone IV for 5 days - Will schedule for repeat EGD for esophageal varices surveillance and repeat band ligation if needed in 3 to 4 weeks   Bayley M McMichael  12/29/2022, 10:54 AM   Attending physician's note   I have taken a history, reviewed the chart and examined the patient. I performed a substantive portion of this encounter, including complete performance of at least one of the key components, in conjunction with the APP. I agree with the APP's note, impression and recommendations.   Hemoglobin is trending down, no  bowel movement since yesterday.  He had a soft normal bowel movement yesterday Continue to monitor and transfuse as needed Pending IR consult for possible TIPS  GI will continue to follow along.  The patient was provided an opportunity to ask questions and all were answered. The patient agreed with the plan and demonstrated an understanding of the instructions.   Iona Beard , MD [phone]

## 2022-12-30 ENCOUNTER — Inpatient Hospital Stay (HOSPITAL_COMMUNITY): Payer: Medicaid Other

## 2022-12-30 DIAGNOSIS — I8511 Secondary esophageal varices with bleeding: Secondary | ICD-10-CM

## 2022-12-30 DIAGNOSIS — K7030 Alcoholic cirrhosis of liver without ascites: Secondary | ICD-10-CM | POA: Diagnosis not present

## 2022-12-30 DIAGNOSIS — D62 Acute posthemorrhagic anemia: Secondary | ICD-10-CM | POA: Diagnosis not present

## 2022-12-30 DIAGNOSIS — I340 Nonrheumatic mitral (valve) insufficiency: Secondary | ICD-10-CM | POA: Diagnosis not present

## 2022-12-30 DIAGNOSIS — K920 Hematemesis: Secondary | ICD-10-CM | POA: Diagnosis not present

## 2022-12-30 LAB — BASIC METABOLIC PANEL
Anion gap: 7 (ref 5–15)
BUN: 9 mg/dL (ref 6–20)
CO2: 22 mmol/L (ref 22–32)
Calcium: 7.7 mg/dL — ABNORMAL LOW (ref 8.9–10.3)
Chloride: 105 mmol/L (ref 98–111)
Creatinine, Ser: 0.78 mg/dL (ref 0.61–1.24)
GFR, Estimated: >60 mL/min (ref >60)
Glucose, Bld: 94 mg/dL (ref 70–99)
Potassium: 3.4 mmol/L — ABNORMAL LOW (ref 3.5–5.1)
Sodium: 134 mmol/L — ABNORMAL LOW (ref 135–145)

## 2022-12-30 LAB — GLUCOSE, CAPILLARY
Glucose-Capillary: 86 mg/dL (ref 70–99)
Glucose-Capillary: 102 mg/dL — ABNORMAL HIGH (ref 70–99)
Glucose-Capillary: 77 mg/dL (ref 70–99)

## 2022-12-30 LAB — BPAM RBC
Blood Product Expiration Date: 202407072359
ISSUE DATE / TIME: 202406061942
ISSUE DATE / TIME: 202406070836
Unit Type and Rh: 5100
Unit Type and Rh: 5100

## 2022-12-30 LAB — ECHOCARDIOGRAM COMPLETE
Area-P 1/2: 3.48 cm2
Height: 74 in
S' Lateral: 3.5 cm
Weight: 3075.86 oz

## 2022-12-30 LAB — TYPE AND SCREEN
Antibody Screen: NEGATIVE
Unit division: 0
Unit division: 0

## 2022-12-30 LAB — CBC
HCT: 22.7 % — ABNORMAL LOW (ref 39.0–52.0)
Hemoglobin: 7.7 g/dL — ABNORMAL LOW (ref 13.0–17.0)
MCH: 31.4 pg (ref 26.0–34.0)
MCHC: 33.9 g/dL (ref 30.0–36.0)
MCV: 92.7 fL (ref 80.0–100.0)
Platelets: 58 10*3/uL — ABNORMAL LOW (ref 150–400)
RBC: 2.45 MIL/uL — ABNORMAL LOW (ref 4.22–5.81)
RDW: 16.1 % — ABNORMAL HIGH (ref 11.5–15.5)
WBC: 4.2 10*3/uL (ref 4.0–10.5)
nRBC: 0 % (ref 0.0–0.2)

## 2022-12-30 MED ORDER — POTASSIUM CHLORIDE CRYS ER 20 MEQ PO TBCR
40.0000 meq | EXTENDED_RELEASE_TABLET | Freq: Once | ORAL | Status: AC
  Administered 2022-12-30: 40 meq via ORAL
  Filled 2022-12-30: qty 2

## 2022-12-30 MED ORDER — STERILE WATER FOR INJECTION IJ SOLN
INTRAMUSCULAR | Status: AC
  Administered 2022-12-30: 10 mL
  Filled 2022-12-30: qty 10

## 2022-12-30 NOTE — Progress Notes (Addendum)
Attending physician's note   I have taken a history, reviewed the chart, and examined the patient. I performed a substantive portion of this encounter, including complete performance of at least one of the key components, in conjunction with the APP. I agree with the APP's note, impression, and recommendations with my edits.   Discussed his care extensively at bedside today along with his mother in the room.  No further bleeding.  Being evaluated by IR for possible TIPS.  TTE and preoperative CTA completed earlier today.  We discussed long-term management of EtOH cirrhosis at length today.  No EtOH for over a year now. - Continue Rocephin x 5 days - Octreotide x 72 hours total - Continue high-dose PPI - Continue daily CBC checks - If he undergoes TIPS, will obviate need for continued EGDs for variceal surveillance - GI service will continue to follow  Stephen Kreiter, DO, FACG [phone] office          Progress Note  Primary GI: Dr. Meridee Score  LOS: 4 days   Chief Complaint: Hematemesis   Subjective   Patient reports no further nausea or vomiting.  Denies bowel movements.  Reports improvement in gas pain.  Tolerating diet without difficulty.   Objective   Vital signs in last 24 hours: Temp:  [97.5 F (36.4 C)-98.5 F (36.9 C)] 97.5 F (36.4 C) (06/10 0742) Pulse Rate:  [69-88] 69 (06/10 0742) Resp:  [15-18] 15 (06/10 0742) BP: (93-106)/(55-61) 94/60 (06/10 0742) SpO2:  [92 %-99 %] 94 % (06/10 0742) Last BM Date : 12/28/22 Last BM recorded by nurses in past 5 days Stool Type: Type 5 (Soft blobs with clear-cut edges) (12/28/2022  4:00 PM)  General:   male in no acute distress  Heart:  Regular rate and rhythm; no murmurs Pulm: Clear anteriorly; no wheezing Abdomen: soft, nondistended, normal bowel sounds in all quadrants. Nontender without guarding. No organomegaly appreciated. Extremities:  No edema Neurologic:  Alert and  oriented x4;  No focal deficits.   Psych:  Cooperative. Normal mood and affect.  Intake/Output from previous day: 06/09 0701 - 06/10 0700 In: 1230 [P.O.:1200; I.V.:30] Out: -  Intake/Output this shift: No intake/output data recorded.  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 Patient underwent BRTO procedure in December of 2023. EXAM: CTA ABDOMEN AND PELVIS WITHOUT AND WITH CONTRAST TECHNIQUE: Multidetector CT imaging of the abdomen and pelvis was performed using the standard protocol during bolus administration of intravenous contrast. Multiplanar reconstructed images and MIPs were obtained and reviewed to evaluate the vascular anatomy. RADIATION DOSE REDUCTION: This exam was performed according to the departmental dose-optimization program which includes automated exposure control, adjustment of the mA and/or kV according to patient size and/or use of iterative reconstruction technique. CONTRAST:  OMNIPAQUE IOHEXOL 350 MG/ML SOLN COMPARISON:  Most recent prior CT scan of the abdomen and pelvis 07/05/2022 and 07/10/2022 FINDINGS: VASCULAR Aorta: Normal caliber aorta without aneurysm, dissection, vasculitis or significant stenosis. Trace atherosclerotic vascular calcifications. Celiac: Patent without evidence of aneurysm, dissection, vasculitis or significant stenosis. SMA: Patent without evidence of aneurysm, dissection, vasculitis or significant stenosis. Renals: Both renal arteries are patent without evidence of aneurysm, dissection, vasculitis, fibromuscular dysplasia or significant stenosis. IMA: Patent without evidence of aneurysm, dissection, vasculitis or significant stenosis. Inflow: Patent without evidence of aneurysm, dissection, vasculitis or significant stenosis. Proximal Outflow: Bilateral common femoral and visualized portions of the superficial and  profunda femoral arteries are patent without evidence  of aneurysm, dissection, vasculitis or significant stenosis. Veins: Coil embolization of gastro renal shunt. Interval obliteration of gastric varices. No residual varices seen in the upper abdomen. The portal vein is patent. Recanalized periumbilical vein. Small esophageal varices are present. Splenic vein not well evaluated due to streak artifact from the coil pack in the prior gastro renal shunt, however it appears patent. The SMV is widely patent. Retroaortic left renal vein noted incidentally. No evidence of DVT in the femoral or iliac veins. Review of the MIP images confirms the above findings. NON-VASCULAR Lower chest: Trace bilateral pleural effusions. Bilateral lower lobe atelectasis. The heart is normal in size. No pericardial effusion. Calcifications present along the coronary arteries. Hepatobiliary: Cirrhotic liver morphology. No evidence of a arterially enhancing lesion to suggest hepatocellular carcinoma. Multiple circumscribed low-attenuation lesions are again noted scattered throughout the liver consistent with simple cysts. Small stones are present within the gallbladder lumen. No biliary ductal dilatation. Pancreas: Unremarkable. No pancreatic ductal dilatation or surrounding inflammatory changes. Spleen: Splenomegaly. Adrenals/Urinary Tract: Normal adrenal glands. No hydronephrosis, nephrolithiasis or enhancing renal mass. Multiple circumscribed low-attenuation renal lesions are again seen without significant interval change. Findings are consistent with simple cysts. No imaging follow-up is recommended. Unremarkable ureters. The bladder is distended with urine. Stomach/Bowel: Diffuse submucosal edema present in the cecum, terminal ileum and ascending colon. 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 No evidence of bowel obstruction. Lymphatic: No suspicious. Reproductive: Prostate is unremarkable. Other:  Moderate ascites.  Diffuse mesenteric edema. Musculoskeletal: No acute fracture or aggressive appearing lytic or blastic osseous lesion. IMPRESSION: VASCULAR 1. Successful prior coil assisted balloon occluded retrograde transvenous obliteration of gastric varices. Gastric varices are completely resolved and the prior gastro renal shunt remains occluded. 2. Portal hypertension with small esophageal varices as expected. No new or clinically significant portosystemic collaterals. 3. No evidence of portal or visceral venous thrombosis. 4. Coronary artery and aortic atherosclerotic vascular calcifications. No acute arterial pathology. NON-VASCULAR 1. Hepatic cirrhosis with portal hypertension and moderate ascites. 2. 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 In the appropriate clinical setting, infectious/inflammatory colitis could appear similar. 3. Trace bilateral pleural effusions and associated lower lobe atelectasis. 4. Additional ancillary findings as above. Electronically Signed   By: Malachy Moan M.D.   On: 12/30/2022 08:11    Lab Results: Recent Labs    12/28/22 1743 12/29/22 0147 12/29/22 1230 12/29/22 2130 12/30/22 0436  WBC 7.4 5.4  --   --  4.2  HGB 8.6* 7.4* 7.6* 8.1* 7.7*  HCT 26.6* 22.3* 23.4* 24.8* 22.7*  PLT 47* 43*  --   --  58*   BMET Recent Labs    12/28/22 0125 12/30/22 0436  NA 137 134*  K 3.6 3.4*  CL 105 105  CO2 22 22  GLUCOSE 109* 94  BUN 16 9  CREATININE 0.81 0.78  CALCIUM 7.6* 7.7*   LFT No results for input(s): "PROT", "ALBUMIN", "AST", "ALT", "ALKPHOS", "BILITOT", "BILIDIR", "IBILI" in the last 72 hours. PT/INR No results for input(s): "LABPROT", "INR" in the last 72 hours.   Scheduled Meds:  sodium chloride   Intravenous Once   sodium chloride   Intravenous Once   sodium chloride   Intravenous Once   Chlorhexidine Gluconate Cloth  6 each Topical Daily   insulin aspart  0-15 Units  Subcutaneous Q4H   pantoprazole (  PROTONIX) IV  40 mg Intravenous Q12H   sodium chloride flush  10-40 mL Intracatheter Q12H   Continuous Infusions:  sodium chloride Stopped (12/28/22 0922)   cefTRIAXone (ROCEPHIN)  IV 1 g (12/29/22 2151)      Patient profile:   Stephen Miranda is a 52 y.o. male with history of EtOH cirrhosis with EV, gastric varices s/p BRTO December 2023, Barrett's esophagus, Richter's hernia presented with hematemesis.    Impression:   Recurrent GI bleed from esophageal varices status post esophageal variceal band ligation 12/27/2022  MELD 3.0 11 - hgb 7.7 (8.1) - Platelets 58, improved - GGT 20 Currently being evaluated by IR and they are planning to get echo and CTA prior to formal TIPS consult.  No further bleeding at this time.  Hemoglobin stable.    Plan:   - Appreciate recs from IR. Pending IR consult for possible TIPS - Continue daily CBC and transfuse as needed to maintain HGB > 7  - Continue supportive care - Can use miralax if constipation continues   Bayley Leanna Sato  12/30/2022, 9:59 AM

## 2022-12-30 NOTE — Progress Notes (Signed)
  Echocardiogram 2D Echocardiogram has been performed.  Sheralyn Boatman R 12/30/2022, 10:14 AM

## 2022-12-30 NOTE — Progress Notes (Addendum)
PROGRESS NOTE        PATIENT DETAILS Name: Stephen Miranda Age: 52 y.o. Sex: male Date of Birth: 1971-02-23 Admit Date: 12/26/2022 Admitting Physician Cheri Fowler, MD [id number], Lanora Manis, MD  Brief Summary: Patient is a 52 y.o.  male with history of alcoholic liver cirrhosis-presented with hemorrhagic shock in the setting of upper GI bleed with acute blood loss anemia.  Admitted by PCCM to the ICU-required pressors-and numerous units of PRBC.  Evaluated by GI-underwent EGD with banding.  Stabilized and subsequently transferred to [facility] on 6/9.  Significant events: 6/6>> admit to ICU by PCCM-upper GI bleed-with ABLA/hemorrhagic shock.  Intubated for EGD.  EGD with banding. 6/7>> extubated 6/9>> transferred to [facility]  Significant studies: 6/6>> CXR: No acute abnormality 6/9>> CT angio BRTO protocol: Successful prior coil assisted occluded retrograde transvenous obliteration of gastric varices, portal hypertension.  Hepatic cirrhosis.  Significant microbiology data: None  Procedures: 6/6>> EGD: Large esophageal varices with stigmata of recent bleeding-banding performed-clotted blood in the gastric fundus. 6/6-6/7 ETT  Consults: None  Subjective: No BM x 2 days.  No abdominal pain.  No chest pain.  Eating breakfast this morning when seen earlier.  Objective: Vitals: Blood pressure 94/60, pulse 69, temperature (!) 97.5 F (36.4 C), temperature source Oral, resp. rate 15, height 6\' 2"  (1.88 m), weight 87.2 kg, SpO2 94 %.   Exam: Gen Exam:Alert awake-not in any distress HEENT:atraumatic, normocephalic Chest: B/L clear to auscultation anteriorly CVS:S1S2 regular Abdomen:soft non tender, non distended Extremities:no edema Neurology: Non focal Skin: no rash  Pertinent Labs/Radiology:    Latest Ref Rng & Units 12/30/2022    4:36 AM 12/29/2022    9:30 PM 12/29/2022   12:30 PM  CBC  WBC 4.0 - 10.5 K/uL 4.2     Hemoglobin 13.0 - 17.0 g/dL 7.7  8.1  7.6    Hematocrit 39.0 - 52.0 % 22.7  24.8  23.4   Platelets 150 - 400 K/uL 58       Lab Results  Component Value Date   NA 134 (L) 12/30/2022   K 3.4 (L) 12/30/2022   CL 105 12/30/2022   CO2 22 12/30/2022      Assessment/Plan: Hemorrhagic shock due to upper GI bleeding from variceal bleed Acute blood loss anemia BP soft but stable-no BM x 2 days-bleeding seems to have resolved Completed octreotide x 72 hours Remains on PPI and IR eval for TIPS ongoing-echo pending Hb stable-required 2 units of PRBC so far. GI following.  Alcoholic liver cirrhosis Prior GI bleeding-s/p BRTO in 2023 Thrombocytopenia Coagulopathy Rocephin x 5 days for SBP prophylaxis Diuretics/beta-blocker on hold-as BP soft-May need midodrine-however volume status stable for now. TIPS being contemplated-see above  Hypokalemia Replete/recheck  BMI: Estimated body mass index is 24.68 kg/m as calculated from the following:   Height as of this encounter: 6\' 2"  (1.88 m).   Weight as of this encounter: 87.2 kg.   Code status:   Code Status: Full Code   DVT Prophylaxis: SCDs Start: 12/26/22 1959   Family Communication: None at bedside   Disposition Plan: Status is: Inpatient Remains inpatient appropriate because: Severity of illness   Planned Discharge Destination:Home   Diet: Diet Order             Diet full liquid Room service appropriate? Yes; Fluid consistency: Thin  Diet effective now  Antimicrobial agents: Anti-infectives (From admission, onward)    Start     Dose/Rate Route Frequency Ordered Stop   12/26/22 2230  cefTRIAXone (ROCEPHIN) 1 g in sodium chloride 0.9 % 100 mL IVPB        1 g 200 mL/hr over 30 Minutes Intravenous Every 24 hours 12/26/22 2141 01/01/23 1024        MEDICATIONS: Scheduled Meds:  Chlorhexidine Gluconate Cloth  6 each Topical Daily   insulin aspart  0-15 Units Subcutaneous Q4H   pantoprazole (PROTONIX) IV  40 mg Intravenous Q12H    sodium chloride flush  10-40 mL Intracatheter Q12H   Continuous Infusions:  sodium chloride Stopped (12/28/22 0922)   cefTRIAXone (ROCEPHIN)  IV 1 g (12/29/22 2151)   PRN Meds:.acetaminophen, docusate sodium, magic mouthwash w/lidocaine, ondansetron (ZOFRAN) IV, mouth rinse, polyethylene glycol, sodium chloride flush   I have personally reviewed following labs and imaging studies  LABORATORY DATA: CBC: Recent Labs  Lab 12/26/22 1811 12/27/22 0016 12/27/22 1747 12/28/22 0125 12/28/22 1120 12/28/22 1743 12/29/22 0147 12/29/22 1230 12/29/22 2130 12/30/22 0436  WBC 7.2   < > 19.3* 13.5*  --  7.4 5.4  --   --  4.2  NEUTROABS 5.0  --   --   --   --   --   --   --   --   --   HGB 11.9*   < > 11.2* 8.6*   < > 8.6* 7.4* 7.6* 8.1* 7.7*  HCT 37.2*   < > 33.7* 24.9*   < > 26.6* 22.3* 23.4* 24.8* 22.7*  MCV 95.9   < > 91.6 90.9  --  94.7 92.9  --   --  92.7  PLT 113*   < > 99* 68*  --  47* 43*  --   --  58*   < > = values in this interval not displayed.     Basic Metabolic Panel: Recent Labs  Lab 12/26/22 1811 12/27/22 0016 12/27/22 0245 12/28/22 0125 12/30/22 0436  NA 140 142 140 137 134*  K 4.2 5.0 5.0 3.6 3.4*  CL 111  --  111 105 105  CO2 20*  --  19* 22 22  GLUCOSE 117*  --  138* 109* 94  BUN 28*  --  24* 16 9  CREATININE 0.73  --  0.67 0.81 0.78  CALCIUM 8.3*  --  8.3* 7.6* 7.7*  MG  --   --  1.5*  --   --   PHOS  --   --  4.0  --   --      GFR: Estimated Creatinine Clearance: 127 mL/min (by C-G formula based on SCr of 0.78 mg/dL).  Liver Function Tests: Recent Labs  Lab 12/26/22 1811  AST 32  ALT 20  ALKPHOS 58  BILITOT 1.1  PROT 5.5*  ALBUMIN 2.7*    Recent Labs  Lab 12/26/22 1811  LIPASE 28    No results for input(s): "AMMONIA" in the last 168 hours.  Coagulation Profile: Recent Labs  Lab 12/26/22 1810 12/27/22 0245  INR 1.5* 1.4*     Cardiac Enzymes: No results for input(s): "CKTOTAL", "CKMB", "CKMBINDEX", "TROPONINI" in the last  168 hours.  BNP (last 3 results) No results for input(s): "PROBNP" in the last 8760 hours.  Lipid Profile: No results for input(s): "CHOL", "HDL", "LDLCALC", "TRIG", "CHOLHDL", "LDLDIRECT" in the last 72 hours.  Thyroid Function Tests: No results for input(s): "TSH", "T4TOTAL", "FREET4", "T3FREE", "THYROIDAB" in the last  72 hours.  Anemia Panel: No results for input(s): "VITAMINB12", "FOLATE", "FERRITIN", "TIBC", "IRON", "RETICCTPCT" in the last 72 hours.  Urine analysis:    Component Value Date/Time   COLORURINE YELLOW 12/26/2022 2045   APPEARANCEUR CLEAR 12/26/2022 2045   LABSPEC 1.019 12/26/2022 2045   PHURINE 6.0 12/26/2022 2045   GLUCOSEU NEGATIVE 12/26/2022 2045   HGBUR NEGATIVE 12/26/2022 2045   BILIRUBINUR NEGATIVE 12/26/2022 2045   KETONESUR 5 (A) 12/26/2022 2045   PROTEINUR NEGATIVE 12/26/2022 2045   NITRITE NEGATIVE 12/26/2022 2045   LEUKOCYTESUR NEGATIVE 12/26/2022 2045    Sepsis Labs: Lactic Acid, Venous    Component Value Date/Time   LATICACIDVEN 1.7 07/03/2022 9604    MICROBIOLOGY: Recent Results (from the past 240 hour(s))  MRSA Next Gen by PCR, Nasal     Status: None   Collection Time: 12/27/22  9:16 AM   Specimen: Nasal Mucosa; Nasal Swab  Result Value Ref Range Status   MRSA by PCR Next Gen NOT DETECTED NOT DETECTED Final    Comment: (NOTE) The GeneXpert MRSA Assay (FDA approved for NASAL specimens only), is one component of a comprehensive MRSA colonization surveillance program. It is not intended to diagnose MRSA infection nor to guide or monitor treatment for MRSA infections. Test performance is not FDA approved in patients less than 52 years old. Performed at [facility], [address], [place], [place] [address]     RADIOLOGY STUDIES/RESULTS: CT ANGIO ABD/PELVIS BRTO  Result Date: 12/30/2022 CLINICAL DATA:  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 Patient underwent BRTO procedure in December of  2023. EXAM: CTA ABDOMEN AND PELVIS WITHOUT AND WITH CONTRAST TECHNIQUE: Multidetector CT imaging of the abdomen and pelvis was performed using the standard protocol during bolus administration of intravenous contrast. Multiplanar reconstructed images and MIPs were obtained and reviewed to evaluate the vascular anatomy. RADIATION DOSE REDUCTION: This exam was performed according to the departmental dose-optimization program which includes automated exposure control, adjustment of the mA and/or kV according to patient size and/or use of iterative reconstruction technique. CONTRAST:  OMNIPAQUE IOHEXOL 350 MG/ML SOLN COMPARISON:  Most recent prior CT scan of the abdomen and pelvis 07/05/2022 and 07/10/2022 FINDINGS: VASCULAR Aorta: Normal caliber aorta without aneurysm, dissection, vasculitis or significant stenosis. Trace atherosclerotic vascular calcifications. Celiac: Patent without evidence of aneurysm, dissection, vasculitis or significant stenosis. SMA: Patent without evidence of aneurysm, dissection, vasculitis or significant stenosis. Renals: Both renal arteries are patent without evidence of aneurysm, dissection, vasculitis, fibromuscular dysplasia or significant stenosis. IMA: Patent without evidence of aneurysm, dissection, vasculitis or significant stenosis. Inflow: Patent without evidence of aneurysm, dissection, vasculitis or significant stenosis. Proximal Outflow: Bilateral common femoral and visualized portions of the superficial and profunda femoral arteries are patent without evidence of aneurysm, dissection, vasculitis or significant stenosis. Veins: Coil embolization of gastro renal shunt. Interval obliteration of gastric varices. No residual varices seen in the upper abdomen. The portal vein is patent. Recanalized periumbilical vein. Small esophageal varices are present. Splenic vein not well evaluated due to streak artifact from the coil pack in the prior gastro renal shunt, however it  appears patent. The SMV is widely patent. Retroaortic left renal vein noted incidentally. No evidence of DVT in the femoral or iliac veins. Review of the MIP images confirms the above findings. NON-VASCULAR Lower chest: Trace bilateral pleural effusions. Bilateral lower lobe atelectasis. The heart is normal in size. No pericardial effusion. Calcifications present along the coronary arteries. Hepatobiliary: Cirrhotic liver morphology. No evidence  of a arterially enhancing lesion to suggest hepatocellular carcinoma. Multiple circumscribed low-attenuation lesions are again noted scattered throughout the liver consistent with simple cysts. Small stones are present within the gallbladder lumen. No biliary ductal dilatation. Pancreas: Unremarkable. No pancreatic ductal dilatation or surrounding inflammatory changes. Spleen: Splenomegaly. Adrenals/Urinary Tract: Normal adrenal glands. No hydronephrosis, nephrolithiasis or enhancing renal mass. Multiple circumscribed low-attenuation renal lesions are again seen without significant interval change. Findings are consistent with simple cysts. No imaging follow-up is recommended. Unremarkable ureters. The bladder is distended with urine. Stomach/Bowel: Diffuse submucosal edema present in the cecum, terminal ileum and ascending colon. 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 No evidence of bowel obstruction. Lymphatic: No suspicious. Reproductive: Prostate is unremarkable. Other: Moderate ascites.  Diffuse mesenteric edema. Musculoskeletal: No acute fracture or aggressive appearing lytic or blastic osseous lesion. IMPRESSION: VASCULAR 1. Successful prior coil assisted balloon occluded retrograde transvenous obliteration of gastric varices. Gastric varices are completely resolved and the prior gastro renal shunt remains occluded. 2. Portal hypertension with small esophageal varices as  expected. No new or clinically significant portosystemic collaterals. 3. No evidence of portal or visceral venous thrombosis. 4. Coronary artery and aortic atherosclerotic vascular calcifications. No acute arterial pathology. NON-VASCULAR 1. Hepatic cirrhosis with portal hypertension and moderate ascites. 2. 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 In the appropriate clinical setting, infectious/inflammatory colitis could appear similar. 3. Trace bilateral pleural effusions and associated lower lobe atelectasis. 4. Additional ancillary findings as above. Electronically Signed   By: Malachy Moan M.D.   On: 12/30/2022 08:11     LOS: 4 days   Jeoffrey Massed, MD  [facility]    To contact the attending provider between 7A-7P or the covering provider during after hours 7P-7A, please log into the web site [email or web address] and access using universal [place] password for that web site. If you do not have the password, please call the hospital operator.  12/30/2022, 11:06 AM

## 2022-12-31 DIAGNOSIS — I8511 Secondary esophageal varices with bleeding: Secondary | ICD-10-CM | POA: Diagnosis not present

## 2022-12-31 DIAGNOSIS — K920 Hematemesis: Secondary | ICD-10-CM | POA: Diagnosis not present

## 2022-12-31 DIAGNOSIS — K7031 Alcoholic cirrhosis of liver with ascites: Secondary | ICD-10-CM | POA: Diagnosis not present

## 2022-12-31 DIAGNOSIS — D62 Acute posthemorrhagic anemia: Secondary | ICD-10-CM | POA: Diagnosis not present

## 2022-12-31 LAB — CBC
HCT: 23.7 % — ABNORMAL LOW (ref 39.0–52.0)
Hemoglobin: 7.7 g/dL — ABNORMAL LOW (ref 13.0–17.0)
MCH: 30.4 pg (ref 26.0–34.0)
MCHC: 32.5 g/dL (ref 30.0–36.0)
MCV: 93.7 fL (ref 80.0–100.0)
Platelets: 64 10*3/uL — ABNORMAL LOW (ref 150–400)
RBC: 2.53 MIL/uL — ABNORMAL LOW (ref 4.22–5.81)
RDW: 15.9 % — ABNORMAL HIGH (ref 11.5–15.5)
WBC: 3.4 10*3/uL — ABNORMAL LOW (ref 4.0–10.5)
nRBC: 0 % (ref 0.0–0.2)

## 2022-12-31 LAB — COMPREHENSIVE METABOLIC PANEL
ALT: 15 U/L (ref 0–44)
AST: 21 U/L (ref 15–41)
Albumin: 2.2 g/dL — ABNORMAL LOW (ref 3.5–5.0)
Alkaline Phosphatase: 38 U/L (ref 38–126)
Anion gap: 9 (ref 5–15)
BUN: 5 mg/dL — ABNORMAL LOW (ref 6–20)
CO2: 23 mmol/L (ref 22–32)
Calcium: 8 mg/dL — ABNORMAL LOW (ref 8.9–10.3)
Chloride: 105 mmol/L (ref 98–111)
Creatinine, Ser: 0.78 mg/dL (ref 0.61–1.24)
GFR, Estimated: >60 mL/min (ref >60)
Glucose, Bld: 89 mg/dL (ref 70–99)
Potassium: 3.5 mmol/L (ref 3.5–5.1)
Sodium: 137 mmol/L (ref 135–145)
Total Bilirubin: 0.8 mg/dL (ref 0.3–1.2)
Total Protein: 4.7 g/dL — ABNORMAL LOW (ref 6.5–8.1)

## 2022-12-31 LAB — MAGNESIUM: Magnesium: 1.5 mg/dL — ABNORMAL LOW (ref 1.7–2.4)

## 2022-12-31 MED ORDER — PANTOPRAZOLE SODIUM 40 MG PO TBEC
40.0000 mg | DELAYED_RELEASE_TABLET | Freq: Two times a day (BID) | ORAL | Status: DC
  Administered 2022-12-31 – 2023-01-04 (×7): 40 mg via ORAL
  Filled 2022-12-31 (×7): qty 1

## 2022-12-31 MED ORDER — MAGNESIUM SULFATE 4 GM/100ML IV SOLN
4.0000 g | Freq: Once | INTRAVENOUS | Status: AC
  Administered 2022-12-31: 4 g via INTRAVENOUS
  Filled 2022-12-31: qty 100

## 2022-12-31 MED ORDER — MIDODRINE HCL 5 MG PO TABS
5.0000 mg | ORAL_TABLET | Freq: Two times a day (BID) | ORAL | Status: DC
  Administered 2022-12-31 – 2023-01-04 (×8): 5 mg via ORAL
  Filled 2022-12-31 (×8): qty 1

## 2022-12-31 MED ORDER — POTASSIUM CHLORIDE CRYS ER 20 MEQ PO TBCR
20.0000 meq | EXTENDED_RELEASE_TABLET | Freq: Once | ORAL | Status: AC
  Administered 2022-12-31: 20 meq via ORAL
  Filled 2022-12-31: qty 1

## 2022-12-31 NOTE — Progress Notes (Signed)
PROGRESS NOTE        PATIENT DETAILS Name: Stephen Miranda Age: 52 y.o. Sex: male Date of Birth: 1971/07/09 Admit Date: 12/26/2022 Admitting Physician Cheri Fowler, MD [id number], Lanora Manis, MD  Brief Summary: Patient is a 52 y.o.  male with history of alcoholic liver cirrhosis-presented with hemorrhagic shock in the setting of upper GI bleed with acute blood loss anemia.  Admitted by PCCM to the ICU-required pressors-and numerous units of PRBC.  Evaluated by GI-underwent EGD with banding.  Stabilized and subsequently transferred to [facility] on 6/9.  Significant events: 6/6>> admit to ICU by PCCM-upper GI bleed-with ABLA/hemorrhagic shock.  Intubated for EGD.  EGD with banding. 6/7>> extubated 6/9>> transferred to [facility]  Significant studies: 6/6>> CXR: No acute abnormality 6/9>> CT angio BRTO protocol: Successful prior coil assisted occluded retrograde transvenous obliteration of gastric varices, portal hypertension.  Hepatic cirrhosis. 6/10>> echo: EF 60-65%, normal pulmonary systolic pressure.  Significant microbiology data: None  Procedures: 6/6>> EGD: Large esophageal varices with stigmata of recent bleeding-banding performed-clotted blood in the gastric fundus. 6/6-6/7 ETT  Consults: None  Subjective: No BM x 3 days.  No complaints.  No abdominal pain.  Objective: Vitals: Blood pressure 112/60, pulse 77, temperature 97.9 F (36.6 C), temperature source Oral, resp. rate 18, height 6\' 2"  (1.88 m), weight 87.2 kg, SpO2 93 %.   Exam: Gen Exam:Alert awake-not in any distress HEENT:atraumatic, normocephalic Chest: B/L clear to auscultation anteriorly CVS:S1S2 regular Abdomen:soft non tender, non distended Extremities:no edema Neurology: Non focal Skin: no rash  Pertinent Labs/Radiology:    Latest Ref Rng & Units 12/31/2022    4:20 AM 12/30/2022    4:36 AM 12/29/2022    9:30 PM  CBC  WBC 4.0 - 10.5 K/uL 3.4  4.2    Hemoglobin 13.0 - 17.0 g/dL 7.7  7.7   8.1   Hematocrit 39.0 - 52.0 % 23.7  22.7  24.8   Platelets 150 - 400 K/uL 64  58      Lab Results  Component Value Date   NA 137 12/31/2022   K 3.5 12/31/2022   CL 105 12/31/2022   CO2 23 12/31/2022      Assessment/Plan: Hemorrhagic shock due to upper GI bleeding from variceal bleed Acute blood loss anemia GI bleeding has resolved-no BM x 3 days Hb stable-has required a total of 2 units of PRBC so far Completed octreotide x 72 hours On PPI GI following IR eval pending for TIPS.  Alcoholic liver cirrhosis Prior GI bleeding-s/p BRTO in 2023 Thrombocytopenia Coagulopathy Rocephin x 5 days for SBP prophylaxis Diuretics/beta-blocker on hold-as BP soft-May need midodrine-however volume status stable for now. TIPS being contemplated-see above  Hypokalemia Repleted  Hypomagnesemia Replete/recheck  BMI: Estimated body mass index is 24.68 kg/m as calculated from the following:   Height as of this encounter: 6\' 2"  (1.88 m).   Weight as of this encounter: 87.2 kg.   Code status:   Code Status: Full Code   DVT Prophylaxis: SCDs Start: 12/26/22 1959   Family Communication: None at bedside   Disposition Plan: Status is: Inpatient Remains inpatient appropriate because: Severity of illness   Planned Discharge Destination:Home   Diet: Diet Order             Diet full liquid Room service appropriate? Yes; Fluid consistency: Thin  Diet effective now  Antimicrobial agents: Anti-infectives (From admission, onward)    Start     Dose/Rate Route Frequency Ordered Stop   12/26/22 2230  cefTRIAXone (ROCEPHIN) 1 g in sodium chloride 0.9 % 100 mL IVPB        1 g 200 mL/hr over 30 Minutes Intravenous Every 24 hours 12/26/22 2141 01/01/23 1024        MEDICATIONS: Scheduled Meds:  Chlorhexidine Gluconate Cloth  6 each Topical Daily   midodrine  5 mg Oral BID WC   pantoprazole  40 mg Oral BID   sodium chloride flush  10-40 mL Intracatheter  Q12H   Continuous Infusions:  sodium chloride Stopped (12/28/22 0922)   cefTRIAXone (ROCEPHIN)  IV 1 g (12/30/22 2201)   PRN Meds:.acetaminophen, docusate sodium, magic mouthwash w/lidocaine, ondansetron (ZOFRAN) IV, mouth rinse, polyethylene glycol, sodium chloride flush   I have personally reviewed following labs and imaging studies  LABORATORY DATA: CBC: Recent Labs  Lab 12/26/22 1811 12/27/22 0016 12/28/22 0125 12/28/22 1120 12/28/22 1743 12/29/22 0147 12/29/22 1230 12/29/22 2130 12/30/22 0436 12/31/22 0420  WBC 7.2   < > 13.5*  --  7.4 5.4  --   --  4.2 3.4*  NEUTROABS 5.0  --   --   --   --   --   --   --   --   --   HGB 11.9*   < > 8.6*   < > 8.6* 7.4* 7.6* 8.1* 7.7* 7.7*  HCT 37.2*   < > 24.9*   < > 26.6* 22.3* 23.4* 24.8* 22.7* 23.7*  MCV 95.9   < > 90.9  --  94.7 92.9  --   --  92.7 93.7  PLT 113*   < > 68*  --  47* 43*  --   --  58* 64*   < > = values in this interval not displayed.     Basic Metabolic Panel: Recent Labs  Lab 12/26/22 1811 12/27/22 0016 12/27/22 0245 12/28/22 0125 12/30/22 0436 12/31/22 0420  NA 140 142 140 137 134* 137  K 4.2 5.0 5.0 3.6 3.4* 3.5  CL 111  --  111 105 105 105  CO2 20*  --  19* 22 22 23   GLUCOSE 117*  --  138* 109* 94 89  BUN 28*  --  24* 16 9 5*  CREATININE 0.73  --  0.67 0.81 0.78 0.78  CALCIUM 8.3*  --  8.3* 7.6* 7.7* 8.0*  MG  --   --  1.5*  --   --  1.5*  PHOS  --   --  4.0  --   --   --      GFR: Estimated Creatinine Clearance: 127 mL/min (by C-G formula based on SCr of 0.78 mg/dL).  Liver Function Tests: Recent Labs  Lab 12/26/22 1811 12/31/22 0420  AST 32 21  ALT 20 15  ALKPHOS 58 38  BILITOT 1.1 0.8  PROT 5.5* 4.7*  ALBUMIN 2.7* 2.2*    Recent Labs  Lab 12/26/22 1811  LIPASE 28    No results for input(s): "AMMONIA" in the last 168 hours.  Coagulation Profile: Recent Labs  Lab 12/26/22 1810 12/27/22 0245  INR 1.5* 1.4*     Cardiac Enzymes: No results for input(s): "CKTOTAL",  "CKMB", "CKMBINDEX", "TROPONINI" in the last 168 hours.  BNP (last 3 results) No results for input(s): "PROBNP" in the last 8760 hours.  Lipid Profile: No results for input(s): "CHOL", "HDL", "LDLCALC", "TRIG", "CHOLHDL", "LDLDIRECT" in  the last 72 hours.  Thyroid Function Tests: No results for input(s): "TSH", "T4TOTAL", "FREET4", "T3FREE", "THYROIDAB" in the last 72 hours.  Anemia Panel: No results for input(s): "VITAMINB12", "FOLATE", "FERRITIN", "TIBC", "IRON", "RETICCTPCT" in the last 72 hours.  Urine analysis:    Component Value Date/Time   COLORURINE YELLOW 12/26/2022 2045   APPEARANCEUR CLEAR 12/26/2022 2045   LABSPEC 1.019 12/26/2022 2045   PHURINE 6.0 12/26/2022 2045   GLUCOSEU NEGATIVE 12/26/2022 2045   HGBUR NEGATIVE 12/26/2022 2045   BILIRUBINUR NEGATIVE 12/26/2022 2045   KETONESUR 5 (A) 12/26/2022 2045   PROTEINUR NEGATIVE 12/26/2022 2045   NITRITE NEGATIVE 12/26/2022 2045   LEUKOCYTESUR NEGATIVE 12/26/2022 2045    Sepsis Labs: Lactic Acid, Venous    Component Value Date/Time   LATICACIDVEN 1.7 07/03/2022 1610    MICROBIOLOGY: Recent Results (from the past 240 hour(s))  MRSA Next Gen by PCR, Nasal     Status: None   Collection Time: 12/27/22  9:16 AM   Specimen: Nasal Mucosa; Nasal Swab  Result Value Ref Range Status   MRSA by PCR Next Gen NOT DETECTED NOT DETECTED Final    Comment: (NOTE) The GeneXpert MRSA Assay (FDA approved for NASAL specimens only), is one component of a comprehensive MRSA colonization surveillance program. It is not intended to diagnose MRSA infection nor to guide or monitor treatment for MRSA infections. Test performance is not FDA approved in patients less than 82 years old. Performed at [facility], [address], [place], [place] [address]     RADIOLOGY STUDIES/RESULTS: ECHOCARDIOGRAM COMPLETE  Result Date: 12/30/2022    ECHOCARDIOGRAM REPORT   Patient Name:   Eyob Haser  Date of Exam: 12/30/2022 Medical Rec #:   [id number]  Height:       74.0 in Accession #:    [id number] Weight:       192.2 lb Date of Birth:  09-Oct-1970   BSA:          2.137 m Patient Age:    52 years   BP:           94/60 mmHg Patient Gender: M          HR:           73 bpm. Exam Location:  Inpatient Procedure: 2D Echo, 3D Echo, Cardiac Doppler and Color Doppler Indications:    Z01.818 Encounter for other preprocedural examination  History:        Patient has no prior history of Echocardiogram examinations.                 Risk Factors:Hypertension. ETOH. Shock.  Sonographer:    Sheralyn Boatman RDCS Referring Phys: [id number] Werner Lean [facility] IMPRESSIONS  1. Left ventricular ejection fraction, by estimation, is 60 to 65%. Left ventricular ejection fraction by 3D volume is 61 %. The left ventricle has normal function. The left ventricle has no regional wall motion abnormalities. Left ventricular diastolic  parameters were normal.  2. Right ventricular systolic function is normal. The right ventricular size is normal. There is normal pulmonary artery systolic pressure.  3. Left atrial size was mildly dilated.  4. The mitral valve is normal in structure. Mild mitral valve regurgitation.  5. The aortic valve is tricuspid. Aortic valve regurgitation is not visualized. No aortic stenosis is present.  6. Aortic dilatation noted. There is mild dilatation of the aortic root, measuring 43 mm.  7. The inferior vena cava is normal in size with greater than 50% respiratory variability,  suggesting right atrial pressure of 3 mmHg. Comparison(s): No prior Echocardiogram. FINDINGS  Left Ventricle: There is a prominent false tendon present. Left ventricular ejection fraction, by estimation, is 60 to 65%. Left ventricular ejection fraction by 3D volume is 61 %. The left ventricle has normal function. The left ventricle has no regional wall motion abnormalities. The left ventricular internal cavity size was normal in size. There is no left ventricular hypertrophy. Left ventricular  diastolic parameters were normal. Right Ventricle: The right ventricular size is normal. No increase in right ventricular wall thickness. Right ventricular systolic function is normal. There is normal pulmonary artery systolic pressure. The tricuspid regurgitant velocity is 1.75 m/s, and  with an assumed right atrial pressure of 3 mmHg, the estimated right ventricular systolic pressure is 15.2 mmHg. Left Atrium: Left atrial size was mildly dilated. Right Atrium: Right atrial size was normal in size. Pericardium: There is no evidence of pericardial effusion. Mitral Valve: The mitral valve is normal in structure. Mild mitral valve regurgitation. Tricuspid Valve: The tricuspid valve is normal in structure. Tricuspid valve regurgitation is trivial. Aortic Valve: The aortic valve is tricuspid. Aortic valve regurgitation is not visualized. No aortic stenosis is present. Pulmonic Valve: The pulmonic valve was normal in structure. Pulmonic valve regurgitation is trivial. Aorta: Aortic dilatation noted. There is mild dilatation of the aortic root, measuring 43 mm. Venous: The inferior vena cava is normal in size with greater than 50% respiratory variability, suggesting right atrial pressure of 3 mmHg. IAS/Shunts: The atrial septum is grossly normal.  LEFT VENTRICLE PLAX 2D LVIDd:         5.35 cm         Diastology LVIDs:         3.50 cm         LV e' medial:    11.00 cm/s LV PW:         1.45 cm         LV E/e' medial:  6.8 LV IVS:        1.00 cm         LV e' lateral:   15.20 cm/s LVOT diam:     2.60 cm         LV E/e' lateral: 4.9 LV SV:         131 LV SV Index:   61 LVOT Area:     5.31 cm        3D Volume EF                                LV 3D EF:    Left                                             ventricul                                             ar                                             ejection  fraction                                             by 3D                                              volume is                                             61 %.                                 3D Volume EF:                                3D EF:        61 %                                LV EDV:       216 ml                                LV ESV:       84 ml                                LV SV:        132 ml RIGHT VENTRICLE             IVC RV S prime:     14.70 cm/s  IVC diam: 1.70 cm TAPSE (M-mode): 2.2 cm LEFT ATRIUM             Index        RIGHT ATRIUM           Index LA diam:        4.30 cm 2.01 cm/m   RA Area:     15.70 cm LA Vol (A2C):   81.9 ml 38.32 ml/m  RA Volume:   39.10 ml  18.29 ml/m LA Vol (A4C):   56.0 ml 26.20 ml/m LA Biplane Vol: 67.8 ml 31.72 ml/m  AORTIC VALVE LVOT Vmax:   114.00 cm/s LVOT Vmean:  77.600 cm/s LVOT VTI:    0.247 m  AORTA Ao Root diam: 4.30 cm Ao Asc diam:  3.40 cm MITRAL VALVE               TRICUSPID VALVE MV Area (PHT): 3.48 cm    TR Peak grad:   12.2 mmHg MV Decel Time: 218 msec    TR Vmax:        175.00 cm/s MV E velocity: 74.50 cm/s MV A velocity: 56.05 cm/s  SHUNTS MV E/A ratio:  1.33        Systemic VTI:  0.25 m  Systemic Diam: 2.60 cm Laurance Flatten MD Electronically signed by Laurance Flatten MD Signature Date/Time: 12/30/2022/12:19:43 PM    Final    CT ANGIO ABD/PELVIS BRTO  Result Date: 12/30/2022 CLINICAL DATA:  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 Patient underwent BRTO procedure in December of 2023. EXAM: CTA ABDOMEN AND PELVIS WITHOUT AND WITH CONTRAST TECHNIQUE: Multidetector CT imaging of the abdomen and pelvis was performed using the standard protocol during bolus administration of intravenous contrast. Multiplanar reconstructed images and MIPs were obtained and reviewed to evaluate the vascular anatomy. RADIATION DOSE REDUCTION: This exam was performed according to the departmental dose-optimization program which includes automated exposure control,  adjustment of the mA and/or kV according to patient size and/or use of iterative reconstruction technique. CONTRAST:  OMNIPAQUE IOHEXOL 350 MG/ML SOLN COMPARISON:  Most recent prior CT scan of the abdomen and pelvis 07/05/2022 and 07/10/2022 FINDINGS: VASCULAR Aorta: Normal caliber aorta without aneurysm, dissection, vasculitis or significant stenosis. Trace atherosclerotic vascular calcifications. Celiac: Patent without evidence of aneurysm, dissection, vasculitis or significant stenosis. SMA: Patent without evidence of aneurysm, dissection, vasculitis or significant stenosis. Renals: Both renal arteries are patent without evidence of aneurysm, dissection, vasculitis, fibromuscular dysplasia or significant stenosis. IMA: Patent without evidence of aneurysm, dissection, vasculitis or significant stenosis. Inflow: Patent without evidence of aneurysm, dissection, vasculitis or significant stenosis. Proximal Outflow: Bilateral common femoral and visualized portions of the superficial and profunda femoral arteries are patent without evidence of aneurysm, dissection, vasculitis or significant stenosis. Veins: Coil embolization of gastro renal shunt. Interval obliteration of gastric varices. No residual varices seen in the upper abdomen. The portal vein is patent. Recanalized periumbilical vein. Small esophageal varices are present. Splenic vein not well evaluated due to streak artifact from the coil pack in the prior gastro renal shunt, however it appears patent. The SMV is widely patent. Retroaortic left renal vein noted incidentally. No evidence of DVT in the femoral or iliac veins. Review of the MIP images confirms the above findings. NON-VASCULAR Lower chest: Trace bilateral pleural effusions. Bilateral lower lobe atelectasis. The heart is normal in size. No pericardial effusion. Calcifications present along the coronary arteries. Hepatobiliary: Cirrhotic liver morphology. No evidence of a arterially enhancing  lesion to suggest hepatocellular carcinoma. Multiple circumscribed low-attenuation lesions are again noted scattered throughout the liver consistent with simple cysts. Small stones are present within the gallbladder lumen. No biliary ductal dilatation. Pancreas: Unremarkable. No pancreatic ductal dilatation or surrounding inflammatory changes. Spleen: Splenomegaly. Adrenals/Urinary Tract: Normal adrenal glands. No hydronephrosis, nephrolithiasis or enhancing renal mass. Multiple circumscribed low-attenuation renal lesions are again seen without significant interval change. Findings are consistent with simple cysts. No imaging follow-up is recommended. Unremarkable ureters. The bladder is distended with urine. Stomach/Bowel: Diffuse submucosal edema present in the cecum, terminal ileum and ascending colon. 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 No evidence of bowel obstruction. Lymphatic: No suspicious. Reproductive: Prostate is unremarkable. Other: Moderate ascites.  Diffuse mesenteric edema. Musculoskeletal: No acute fracture or aggressive appearing lytic or blastic osseous lesion. IMPRESSION: VASCULAR 1. Successful prior coil assisted balloon occluded retrograde transvenous obliteration of gastric varices. Gastric varices are completely resolved and the prior gastro renal shunt remains occluded. 2. Portal hypertension with small esophageal varices as expected. No new or clinically significant portosystemic collaterals. 3. No evidence of portal or visceral venous thrombosis. 4. Coronary artery and aortic atherosclerotic vascular calcifications. No acute arterial pathology. NON-VASCULAR 1. Hepatic cirrhosis with  portal hypertension and moderate ascites. 2. 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 In the appropriate clinical setting, infectious/inflammatory colitis could  appear similar. 3. Trace bilateral pleural effusions and associated lower lobe atelectasis. 4. Additional ancillary findings as above. Electronically Signed   By: Malachy Moan M.D.   On: 12/30/2022 08:11     LOS: 5 days   Jeoffrey Massed, MD  [facility]    To contact the attending provider between 7A-7P or the covering provider during after hours 7P-7A, please log into the web site [email or web address] and access using universal  password for that web site. If you do not have the password, please call the hospital operator.  12/31/2022, 10:34 AM

## 2022-12-31 NOTE — TOC CM/SW Note (Addendum)
Transition of Care [facility]) - Inpatient Brief Assessment   Patient Details  Name: Stephen Miranda MRN: [id number] Date of Birth: [birth year]  Transition of Care [facility]) CM/SW Contact:    Gordy Clement, RN Phone Number: 12/31/2022, 4:41 PM   Clinical Narrative:  CM met with Patient bedside. Patient asked if he could leave and return end of week for his TIPS procedure.  Provider stated no, he must stay until procedure.    Patient lives alone . He does have an established PCP and is insured.   Patient has not had home health in the past and does not own any DME.   TOC will continue to follow patient for any additional discharge needs      Transition of Care Asessment: Insurance and Status: Insurance coverage has been reviewed Patient has primary care physician: Yes Delrae Alfred, Elizabeth, MD) Home environment has been reviewed: lives alone Prior level of function:: independent Prior/Current Home Services: No current home services   Readmission risk has been reviewed: Yes (23%) Transition of care needs: transition of care needs identified, TOC will continue to follow

## 2022-12-31 NOTE — Progress Notes (Addendum)
Attending physician's note   I have taken a history, reviewed the chart, and examined the patient. I performed a substantive portion of this encounter, including complete performance of at least one of the key components, in conjunction with the APP. I agree with the APP's note, impression, and recommendations with my edits.   Case discussed with Dr. Loreta Ave from IR this morning re: TIPS.  TTE and CTA completed yesterday.  Discussed with patient this morning as well.  No recurrent bleeding.  H/H largely stable.  - Complete 5 days of Rocephin today then discontinue - Continue high-dose PPI - Continue daily CBC check   Cross Jorge, DO, FACG ([phone] office          Progress Note  Primary GI: Dr. Meridee Score  LOS: 5 days   Chief Complaint: Hematemesis/ETOH cirrhosis   Subjective   Patient states he would like to go home whenever possible. Had a "chocolate pudding" stool yesterday. Denies abdominal pain, nausea, and vomiting.   Objective   Vital signs in last 24 hours: Temp:  [97.9 F (36.6 C)-99.2 F (37.3 C)] 97.9 F (36.6 C) (06/11 0811) Pulse Rate:  [69-86] 77 (06/11 0811) Resp:  [11-21] 18 (06/11 0811) BP: (87-112)/(55-61) 112/60 (06/11 0811) SpO2:  [92 %-96 %] 93 % (06/11 0811) Last BM Date : 12/28/22 Last BM recorded by nurses in past 5 days Stool Type: Type 5 (Soft blobs with clear-cut edges) (12/28/2022  4:00 PM)  General:   male in no acute distress  Heart:  Regular rate and rhythm; no murmurs Pulm: Clear anteriorly; no wheezing Abdomen: soft, nondistended, normal bowel sounds in all quadrants. Nontender without guarding. No organomegaly appreciated. Extremities:  No edema Neurologic:  Alert and  oriented x4;  No focal deficits.  Psych:  Cooperative. Normal mood and affect.  Intake/Output from previous day: No intake/output data recorded. Intake/Output this shift: No intake/output data recorded.  Studies/Results: ECHOCARDIOGRAM  COMPLETE  Result Date: 12/30/2022    ECHOCARDIOGRAM REPORT   Patient Name:   Stephen Miranda  Date of Exam: 12/30/2022 Medical Rec #:  [id number]  Height:       74.0 in Accession #:    [id number] Weight:       192.2 lb Date of Birth:  Aug 28, 1970   BSA:          2.137 m Patient Age:    52 years   BP:           94/60 mmHg Patient Gender: M          HR:           73 bpm. Exam Location:  Inpatient Procedure: 2D Echo, 3D Echo, Cardiac Doppler and Color Doppler Indications:    Z01.818 Encounter for other preprocedural examination  History:        Patient has no prior history of Echocardiogram examinations.                 Risk Factors:Hypertension. ETOH. Shock.  Sonographer:    Sheralyn Boatman RDCS Referring Phys: [id number] [facility] IMPRESSIONS  1. Left ventricular ejection fraction, by estimation, is 60 to 65%. Left ventricular ejection fraction by 3D volume is 61 %. The left ventricle has normal function. The left ventricle has no regional wall motion abnormalities. Left ventricular diastolic  parameters were normal.  2. Right ventricular systolic function is normal. The right ventricular size is normal. There is normal pulmonary artery systolic pressure.  3. Left atrial size was mildly  dilated.  4. The mitral valve is normal in structure. Mild mitral valve regurgitation.  5. The aortic valve is tricuspid. Aortic valve regurgitation is not visualized. No aortic stenosis is present.  6. Aortic dilatation noted. There is mild dilatation of the aortic root, measuring 43 mm.  7. The inferior vena cava is normal in size with greater than 50% respiratory variability, suggesting right atrial pressure of 3 mmHg. Comparison(s): No prior Echocardiogram. FINDINGS  Left Ventricle: There is a prominent false tendon present. Left ventricular ejection fraction, by estimation, is 60 to 65%. Left ventricular ejection fraction by 3D volume is 61 %. The left ventricle has normal function. The left ventricle has no regional wall motion  abnormalities. The left ventricular internal cavity size was normal in size. There is no left ventricular hypertrophy. Left ventricular diastolic parameters were normal. Right Ventricle: The right ventricular size is normal. No increase in right ventricular wall thickness. Right ventricular systolic function is normal. There is normal pulmonary artery systolic pressure. The tricuspid regurgitant velocity is 1.75 m/s, and  with an assumed right atrial pressure of 3 mmHg, the estimated right ventricular systolic pressure is 15.2 mmHg. Left Atrium: Left atrial size was mildly dilated. Right Atrium: Right atrial size was normal in size. Pericardium: There is no evidence of pericardial effusion. Mitral Valve: The mitral valve is normal in structure. Mild mitral valve regurgitation. Tricuspid Valve: The tricuspid valve is normal in structure. Tricuspid valve regurgitation is trivial. Aortic Valve: The aortic valve is tricuspid. Aortic valve regurgitation is not visualized. No aortic stenosis is present. Pulmonic Valve: The pulmonic valve was normal in structure. Pulmonic valve regurgitation is trivial. Aorta: Aortic dilatation noted. There is mild dilatation of the aortic root, measuring 43 mm. Venous: The inferior vena cava is normal in size with greater than 50% respiratory variability, suggesting right atrial pressure of 3 mmHg. IAS/Shunts: The atrial septum is grossly normal.  LEFT VENTRICLE PLAX 2D LVIDd:         5.35 cm         Diastology LVIDs:         3.50 cm         LV e' medial:    11.00 cm/s LV PW:         1.45 cm         LV E/e' medial:  6.8 LV IVS:        1.00 cm         LV e' lateral:   15.20 cm/s LVOT diam:     2.60 cm         LV E/e' lateral: 4.9 LV SV:         131 LV SV Index:   61 LVOT Area:     5.31 cm        3D Volume EF                                LV 3D EF:    Left                                             ventricul  ar                                              ejection                                             fraction                                             by 3D                                             volume is                                             61 %.                                 3D Volume EF:                                3D EF:        61 %                                LV EDV:       216 ml                                LV ESV:       84 ml                                LV SV:        132 ml RIGHT VENTRICLE             IVC RV S prime:     14.70 cm/s  IVC diam: 1.70 cm TAPSE (M-mode): 2.2 cm LEFT ATRIUM             Index        RIGHT ATRIUM           Index LA diam:        4.30 cm 2.01 cm/m   RA Area:     15.70 cm LA Vol (A2C):   81.9 ml 38.32 ml/m  RA Volume:   39.10 ml  18.29 ml/m LA Vol (A4C):   56.0 ml 26.20 ml/m LA Biplane Vol: 67.8 ml 31.72 ml/m  AORTIC VALVE LVOT Vmax:   114.00 cm/s LVOT Vmean:  77.600 cm/s LVOT VTI:    0.247 m  AORTA Ao Root diam: 4.30 cm Ao Asc diam:  3.40 cm MITRAL VALVE  TRICUSPID VALVE MV Area (PHT): 3.48 cm    TR Peak grad:   12.2 mmHg MV Decel Time: 218 msec    TR Vmax:        175.00 cm/s MV E velocity: 74.50 cm/s MV A velocity: 56.05 cm/s  SHUNTS MV E/A ratio:  1.33        Systemic VTI:  0.25 m                            Systemic Diam: 2.60 cm Laurance Flatten MD Electronically signed by Laurance Flatten MD Signature Date/Time: 12/30/2022/12:19:43 PM    Final    CT ANGIO ABD/PELVIS BRTO  Result Date: 12/30/2022 CLINICAL DATA:  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 Patient underwent BRTO procedure in December of 2023. EXAM: CTA ABDOMEN AND PELVIS WITHOUT AND WITH CONTRAST TECHNIQUE: Multidetector CT imaging of the abdomen and pelvis was performed using the standard protocol during bolus administration of intravenous contrast. Multiplanar reconstructed images and MIPs were obtained and reviewed to evaluate the vascular anatomy. RADIATION DOSE  REDUCTION: This exam was performed according to the departmental dose-optimization program which includes automated exposure control, adjustment of the mA and/or kV according to patient size and/or use of iterative reconstruction technique. CONTRAST:  OMNIPAQUE IOHEXOL 350 MG/ML SOLN COMPARISON:  Most recent prior CT scan of the abdomen and pelvis 07/05/2022 and 07/10/2022 FINDINGS: VASCULAR Aorta: Normal caliber aorta without aneurysm, dissection, vasculitis or significant stenosis. Trace atherosclerotic vascular calcifications. Celiac: Patent without evidence of aneurysm, dissection, vasculitis or significant stenosis. SMA: Patent without evidence of aneurysm, dissection, vasculitis or significant stenosis. Renals: Both renal arteries are patent without evidence of aneurysm, dissection, vasculitis, fibromuscular dysplasia or significant stenosis. IMA: Patent without evidence of aneurysm, dissection, vasculitis or significant stenosis. Inflow: Patent without evidence of aneurysm, dissection, vasculitis or significant stenosis. Proximal Outflow: Bilateral common femoral and visualized portions of the superficial and profunda femoral arteries are patent without evidence of aneurysm, dissection, vasculitis or significant stenosis. Veins: Coil embolization of gastro renal shunt. Interval obliteration of gastric varices. No residual varices seen in the upper abdomen. The portal vein is patent. Recanalized periumbilical vein. Small esophageal varices are present. Splenic vein not well evaluated due to streak artifact from the coil pack in the prior gastro renal shunt, however it appears patent. The SMV is widely patent. Retroaortic left renal vein noted incidentally. No evidence of DVT in the femoral or iliac veins. Review of the MIP images confirms the above findings. NON-VASCULAR Lower chest: Trace bilateral pleural effusions. Bilateral lower lobe atelectasis. The heart is normal in size. No pericardial effusion.  Calcifications present along the coronary arteries. Hepatobiliary: Cirrhotic liver morphology. No evidence of a arterially enhancing lesion to suggest hepatocellular carcinoma. Multiple circumscribed low-attenuation lesions are again noted scattered throughout the liver consistent with simple cysts. Small stones are present within the gallbladder lumen. No biliary ductal dilatation. Pancreas: Unremarkable. No pancreatic ductal dilatation or surrounding inflammatory changes. Spleen: Splenomegaly. Adrenals/Urinary Tract: Normal adrenal glands. No hydronephrosis, nephrolithiasis or enhancing renal mass. Multiple circumscribed low-attenuation renal lesions are again seen without significant interval change. Findings are consistent with simple cysts. No imaging follow-up is recommended. Unremarkable ureters. The bladder is distended with urine. Stomach/Bowel: Diffuse submucosal edema present in the cecum, terminal ileum and ascending colon. 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 No evidence of bowel obstruction. Lymphatic: No suspicious. Reproductive:  Prostate is unremarkable. Other: Moderate ascites.  Diffuse mesenteric edema. Musculoskeletal: No acute fracture or aggressive appearing lytic or blastic osseous lesion. IMPRESSION: VASCULAR 1. Successful prior coil assisted balloon occluded retrograde transvenous obliteration of gastric varices. Gastric varices are completely resolved and the prior gastro renal shunt remains occluded. 2. Portal hypertension with small esophageal varices as expected. No new or clinically significant portosystemic collaterals. 3. No evidence of portal or visceral venous thrombosis. 4. Coronary artery and aortic atherosclerotic vascular calcifications. No acute arterial pathology. NON-VASCULAR 1. Hepatic cirrhosis with portal hypertension and moderate ascites. 2. 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 In the appropriate clinical setting, infectious/inflammatory colitis could appear similar. 3. Trace bilateral pleural effusions and associated lower lobe atelectasis. 4. Additional ancillary findings as above. Electronically Signed   By: Malachy Moan M.D.   On: 12/30/2022 08:11    Lab Results: Recent Labs    12/29/22 0147 12/29/22 1230 12/29/22 2130 12/30/22 0436 12/31/22 0420  WBC 5.4  --   --  4.2 3.4*  HGB 7.4*   < > 8.1* 7.7* 7.7*  HCT 22.3*   < > 24.8* 22.7* 23.7*  PLT 43*  --   --  58* 64*   < > = values in this interval not displayed.   BMET Recent Labs    12/30/22 0436 12/31/22 0420  NA 134* 137  K 3.4* 3.5  CL 105 105  CO2 22 23  GLUCOSE 94 89  BUN 9 5*  CREATININE 0.78 0.78  CALCIUM 7.7* 8.0*   LFT Recent Labs    12/31/22 0420  PROT 4.7*  ALBUMIN 2.2*  AST 21  ALT 15  ALKPHOS 38  BILITOT 0.8   PT/INR No results for input(s): "LABPROT", "INR" in the last 72 hours.   Scheduled Meds:  Chlorhexidine Gluconate Cloth  6 each Topical Daily   midodrine  5 mg Oral BID WC   pantoprazole  40 mg Oral BID   sodium chloride flush  10-40 mL Intracatheter Q12H   Continuous Infusions:  sodium chloride Stopped (12/28/22 0922)   cefTRIAXone (ROCEPHIN)  IV 1 g (12/30/22 2201)      Patient profile:   Stephen Miranda is a 52 y.o. male with history of EtOH cirrhosis with EV, gastric varices s/p BRTO December 2023, Barrett's esophagus, Richter's hernia presented with hematemesis.   Impression:   Recurrent GI bleed from esophageal varices status post esophageal variceal band ligation 12/27/2022  MELD 3.0 11 - hgb 7.7, stable - Platelets 64, improved - GGT 20 - TTE and CTA completed 6/10 Currently being evaluated by IR for consideration of TIPs.  No further bleeding at this time.  Hemoglobin stable.   Plan:   - Continue Rocephin x 5 days (Today PM is last dose (?)) - Octreotide completed - Continue  high-dose PPI - Continue daily CBC checks - If he undergoes TIPS, will obviate need for continued EGDs for variceal surveillance - GI service will continue to follow  Bayley Leanna Sato  12/31/2022, 12:15 PM

## 2022-12-31 NOTE — Consult Note (Signed)
Chief Complaint: Bleeding esophageal varices  Referring Provider(s): Doristine Locks  Supervising Physician: Gilmer Mor  Patient Status: [facility] - In-pt  History of Present Illness: Stephen Miranda is a 52 y.o. male with alcohol abuse and alcohol-related cirrhosis with esophageal varices with prior history of GI bleeding presented to the ED on 12/26/22 with hematemesis.  Recurrent GI bleed from esophageal varices = He is status post esophageal variceal band ligation 12/27/2022   He is known to IR service for previous BRTO done 07/04/22 by Dr. Miles Costain.  IR is asked to evaluate him for TIPS due to recurrent UGI bleeding.  CTA with BRTO protocol done yesterday and read by Dr. Archer Asa = 1. Successful prior coil assisted balloon occluded retrograde transvenous obliteration of gastric varices. Gastric varices are completely resolved and the prior gastro renal shunt remains occluded. 2. Portal hypertension with small esophageal varices as expected. No new or clinically significant portosystemic collaterals. 3. No evidence of portal or visceral venous thrombosis. Hepatic cirrhosis with portal hypertension and moderate ascites.   Na MELD = 11  Child Class B  Patient is Full Code  Past Medical History:  Diagnosis Date   Chronic insomnia 05/23/2016   Has stated in past since age 50 yo, but worse in recent years with restless legs.   Elevated liver enzymes 04/24/2015   04/18/2015:  AST:  115     ALT:  90. Likely due to alcohol intake   Malignant melanoma of back (HCC) 02/07/2015   Left upper back:  Dr. Irene Limbo [facility]   Restless legs 05/23/2016   Seborrheic dermatitis 05/23/2016    Past Surgical History:  Procedure Laterality Date   ESOPHAGEAL BANDING  12/26/2022   Procedure: ESOPHAGEAL BANDING;  Surgeon: Imogene Burn, MD;  Location: [facility] ENDOSCOPY;  Service: Gastroenterology;;   ESOPHAGOGASTRODUODENOSCOPY N/A 07/03/2022   Procedure:  ESOPHAGOGASTRODUODENOSCOPY (EGD);  Surgeon: Beverley Fiedler, MD;  Location: [facility] ENDOSCOPY;  Service: Gastroenterology;  Laterality: N/A;   ESOPHAGOGASTRODUODENOSCOPY (EGD) WITH PROPOFOL N/A 05/20/2019   Procedure: ESOPHAGOGASTRODUODENOSCOPY (EGD) WITH PROPOFOL;  Surgeon: Meridee Score Netty Starring., MD;  Location: [facility] ENDOSCOPY;  Service: Gastroenterology;  Laterality: N/A;   ESOPHAGOGASTRODUODENOSCOPY (EGD) WITH PROPOFOL N/A 12/26/2022   Procedure: ESOPHAGOGASTRODUODENOSCOPY (EGD) WITH PROPOFOL;  Surgeon: Imogene Burn, MD;  Location: [facility] ENDOSCOPY;  Service: Gastroenterology;  Laterality: N/A;   Excision Malignant Melanoma Left 02/07/2015   Left upper back   IR ANGIOGRAM SELECTIVE EACH ADDITIONAL VESSEL  07/04/2022   IR EMBO ART  VEN HEMORR LYMPH EXTRAV  INC GUIDE ROADMAPPING  07/04/2022   IR PARACENTESIS  07/18/2022   IR US GUIDE VASC ACCESS RIGHT  07/04/2022   IR VENOGRAM RENAL UNI LEFT  07/04/2022   PARACENTESIS  07/18/2022   RADIOLOGY WITH ANESTHESIA N/A 07/04/2022   Procedure: IR WITH ANESTHESIA - TIPS;  Surgeon: Radiologist, Medication, MD;  Location: [facility] OR;  Service: Radiology;  Laterality: N/A;    Allergies: Patient has no known allergies.  Medications: Prior to Admission medications   Medication Sig Start Date End Date Taking? Authorizing Provider  carvedilol (COREG) 3.125 MG tablet TAKE 1 TABLET BY MOUTH IN THE MORNING, and TAKE 2 TABLETS AT BEDTIME 12/16/22  Yes Julieanne Manson, MD  escitalopram (LEXAPRO) 5 MG tablet Take 1 tablet (5 mg total) by mouth daily. 11/18/22  Yes Julieanne Manson, MD  ferrous gluconate (FERGON) 324 MG tablet Take 324 mg by mouth daily with breakfast.   Yes [provider]  furosemide (LASIX) 20 MG tablet 1 tab  by mouth daily in morning with Spironolactone 07/11/22  Yes Julieanne Manson, MD  Multiple Vitamin (MULTIVITAMIN WITH MINERALS) TABS tablet Take 1 tablet by mouth daily. 05/23/19  Yes Calvert Cantor, MD  pantoprazole (PROTONIX) 40 MG  tablet Take 1 tablet (40 mg total) by mouth 2 (two) times daily. 07/11/22  Yes Julieanne Manson, MD  spironolactone (ALDACTONE) 50 MG tablet 1 tab by mouth in morning with furosemide 07/11/22  Yes Julieanne Manson, MD  tamsulosin (FLOMAX) 0.4 MG CAPS capsule Take 1 capsule (0.4 mg total) by mouth daily after supper. 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  Tobacco Use   Smoking status: Every Day    Packs/day: 0.50    Years: 20.00    Additional pack years: 0.00    Total pack years: 10.00    Types: Cigarettes    Passive exposure: Never   Smokeless tobacco: Never   Tobacco comments:    prescribed Chantix in past, but did not take  Vaping Use   Vaping Use: Never used  Substance and Sexual Activity   Alcohol use: Not Currently    Alcohol/week: 4.0 standard drinks of alcohol    Types: 2 Cans of beer, 2 Shots of liquor per week    Comment: Nightly   Drug use: No   Sexual activity: Not on file  Other Topics Concern   Not on file  Social History Narrative   Currently lives with parents   Social Determinants of Health   Financial Resource Strain: Not on file  Food Insecurity: Not on file  Transportation Needs: Not on file  Physical Activity: Not on file  Stress: Not on file  Social Connections: Not on file     Review of Systems: A 12 point ROS discussed and pertinent positives are indicated in the HPI above.  All other systems are negative.  Review of Systems  Vital Signs: BP 112/60 (BP Location: Left Arm)   Pulse 77   Temp 97.9 F (36.6 C) (Oral)   Resp  18   Ht 6\' 2"  (1.88 m)   Wt 192 lb 3.9 oz (87.2 kg)   SpO2 93%   BMI 24.68 kg/m   Advance Care Plan: The advanced care place/surrogate decision maker was discussed at the time of visit and the patient did not wish to discuss or was not able to name a surrogate decision maker or provide an advance care plan.  Physical Exam Vitals reviewed.  Constitutional:      Appearance: Normal appearance.  HENT:     Head: Normocephalic and atraumatic.  Eyes:     Extraocular Movements: Extraocular movements intact.  Cardiovascular:     Rate and Rhythm: Normal rate and regular rhythm.  Pulmonary:     Effort: Pulmonary effort is normal. No respiratory distress.     Breath sounds: Normal breath sounds.  Abdominal:     General: There is no distension.     Palpations: Abdomen is soft.     Tenderness: There is no abdominal tenderness.  Musculoskeletal:        General: Normal range of motion.     Cervical back: Normal range of motion.  Skin:    General: Skin is warm and dry.  Neurological:     General: No focal deficit present.     Mental Status: He is alert and oriented to person, place, and time.  Psychiatric:        Mood and Affect: Mood normal.        Behavior: Behavior normal.        Thought Content: Thought content normal.        Judgment: Judgment normal.     Imaging:  CTA ABDOMEN AND PELVIS WITHOUT AND WITH CONTRAST   TECHNIQUE: Multidetector CT imaging of the abdomen and pelvis was performed using the standard protocol during bolus administration of intravenous contrast. Multiplanar reconstructed images and MIPs were obtained and reviewed to evaluate the vascular anatomy.   RADIATION DOSE REDUCTION: This exam was performed according to the departmental dose-optimization program which includes automated exposure control, adjustment of the mA and/or kV according to patient size and/or use of iterative reconstruction technique.   CONTRAST:  OMNIPAQUE IOHEXOL 350 MG/ML  SOLN   COMPARISON:  Most recent prior CT scan of the abdomen and pelvis 07/05/2022 and 07/10/2022   FINDINGS: VASCULAR   Aorta: Normal caliber aorta without aneurysm, dissection, vasculitis or significant stenosis. Trace atherosclerotic vascular calcifications.   Celiac: Patent without evidence of aneurysm, dissection, vasculitis or significant stenosis.   SMA: Patent without evidence of aneurysm, dissection, vasculitis or significant stenosis.   Renals: Both renal arteries are patent without evidence of aneurysm, dissection, vasculitis, fibromuscular dysplasia or significant stenosis.   IMA: Patent without evidence of aneurysm, dissection, vasculitis or significant stenosis.   Inflow: Patent without evidence of aneurysm, dissection, vasculitis or significant stenosis.   Proximal Outflow: Bilateral common femoral and visualized portions of the superficial and profunda femoral arteries are patent without evidence of aneurysm, dissection, vasculitis or significant stenosis.   Veins: Coil embolization of gastro renal shunt. Interval obliteration of gastric varices. No residual varices seen in the upper abdomen. The portal vein is patent. Recanalized periumbilical vein. Small esophageal varices are present. Splenic vein not well evaluated due to streak artifact from the coil pack in the prior gastro renal shunt, however it appears patent. The SMV is widely patent. Retroaortic left renal vein noted incidentally. No evidence of DVT in the femoral or iliac veins.   Review of the MIP images confirms the above findings.   NON-VASCULAR   Lower chest: Trace bilateral pleural effusions. Bilateral lower lobe atelectasis. The heart is normal in size. No pericardial effusion. Calcifications present along the coronary arteries.   Hepatobiliary: Cirrhotic liver morphology. No evidence of a arterially enhancing lesion to suggest hepatocellular carcinoma. Multiple circumscribed  low-attenuation lesions are again noted scattered throughout the liver consistent with simple cysts. Small stones are present within the gallbladder lumen. No biliary ductal dilatation.   Pancreas: Unremarkable. No pancreatic ductal dilatation or surrounding inflammatory changes.   Spleen: Splenomegaly.   Adrenals/Urinary Tract: Normal adrenal glands. No hydronephrosis, nephrolithiasis or enhancing renal mass. Multiple circumscribed low-attenuation renal lesions are again seen without significant interval change. Findings are consistent with simple cysts. No imaging follow-up is recommended. Unremarkable ureters. The bladder is distended with urine.   Stomach/Bowel: Diffuse submucosal edema present in the cecum, terminal ileum and ascending colon. 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 No evidence of bowel obstruction.   Lymphatic: No suspicious.   Reproductive: Prostate is unremarkable.   Other: Moderate ascites.  Diffuse mesenteric edema.   Musculoskeletal: No acute fracture or aggressive appearing lytic or blastic osseous lesion.   IMPRESSION: VASCULAR   1. Successful prior coil assisted balloon occluded retrograde transvenous obliteration of gastric varices. Gastric varices are completely resolved and the prior gastro renal shunt remains occluded. 2. Portal hypertension with small esophageal varices as expected. No new or clinically significant portosystemic collaterals. 3. No evidence of portal or visceral venous thrombosis. 4. Coronary artery and aortic atherosclerotic vascular calcifications. No acute arterial pathology.   NON-VASCULAR   1. Hepatic cirrhosis with portal hypertension and moderate ascites. 2. 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 In the appropriate clinical setting, infectious/inflammatory colitis  could appear similar. 3. Trace bilateral pleural effusions and associated lower lobe atelectasis. 4. Additional ancillary findings as above.     Electronically Signed   By: Malachy Moan M.D.   On: 12/30/2022 08:11   Procedure: 2D Echo, 3D Echo, Cardiac Doppler and Color Doppler   Indications:    Z01.818 Encounter for other preprocedural examination    History:        Patient has no prior history of Echocardiogram  examinations.                 Risk Factors:Hypertension. ETOH. Shock.    Sonographer:    Sheralyn Boatman RDCS  Referring Phys: [id number] Werner Lean [facility]   IMPRESSIONS     1. Left ventricular ejection fraction, by estimation, is 60 to 65%. Left  ventricular ejection fraction by 3D volume is 61 %. The left ventricle has  normal function. The left ventricle has no regional wall motion  abnormalities. Left ventricular diastolic   parameters were normal.   2. Right ventricular systolic function is normal. The right ventricular  size is normal. There is normal pulmonary artery systolic pressure.   3. Left atrial size was mildly dilated.   4. The mitral valve is normal in structure. Mild mitral valve  regurgitation.   5. The aortic valve is tricuspid. Aortic valve regurgitation is not  visualized. No aortic stenosis is present.   6. Aortic dilatation noted. There is mild dilatation of the aortic root,  measuring 43 mm.   7. The inferior vena cava is normal in size with greater than 50%  respiratory variability, suggesting right atrial pressure of 3 mmHg.   Comparison(s): No prior Echocardiogram.   FINDINGS   Left Ventricle: There is a prominent false tendon present. Left  ventricular ejection fraction, by estimation, is 60 to 65%. Left  ventricular ejection fraction by 3D volume is 61 %. The left ventricle has  normal function. The left ventricle has no  regional wall motion abnormalities. The left ventricular internal cavity  size was normal in size. There is no left  ventricular hypertrophy. Left  ventricular diastolic parameters were normal.   Right Ventricle: The right ventricular size is normal. No increase in  right ventricular wall thickness. Right ventricular systolic function is  normal. There is normal pulmonary artery systolic pressure. The tricuspid  regurgitant velocity is 1.75 m/s, and   with an assumed right atrial pressure of 3 mmHg, the estimated right  ventricular systolic pressure is 15.2 mmHg.   Left Atrium: Left atrial size was mildly dilated.   Right Atrium: Right atrial size was normal in size.   Pericardium: There  is no evidence of pericardial effusion.   Mitral Valve: The mitral valve is normal in structure. Mild mitral valve  regurgitation.   Tricuspid Valve: The tricuspid valve is normal in structure. Tricuspid  valve regurgitation is trivial.   Aortic Valve: The aortic valve is tricuspid. Aortic valve regurgitation is  not visualized. No aortic stenosis is present.   Pulmonic Valve: The pulmonic valve was normal in structure. Pulmonic valve  regurgitation is trivial.   Aorta: Aortic dilatation noted. There is mild dilatation of the aortic  root, measuring 43 mm.   Venous: The inferior vena cava is normal in size with greater than 50%  respiratory variability, suggesting right atrial pressure of 3 mmHg.   IAS/Shunts: The atrial septum is grossly normal.   Laurance Flatten MD  Electronically signed by Laurance Flatten MD  Signature Date/Time: 12/30/2022/12:19:43 PM   Labs:  CBC: Recent Labs    12/28/22 1743 12/29/22 0147 12/29/22 1230 12/29/22 2130 12/30/22 0436 12/31/22 0420  WBC 7.4 5.4  --   --  4.2 3.4*  HGB 8.6* 7.4* 7.6* 8.1* 7.7* 7.7*  HCT 26.6* 22.3* 23.4* 24.8* 22.7* 23.7*  PLT 47* 43*  --   --  58* 64*    COAGS: Recent Labs    07/08/22 0436 07/10/22 1554 12/26/22 1810 12/27/22 0245  INR 1.7* 1.6* 1.5* 1.4*  APTT  --   --   --  32    BMP: Recent Labs    12/27/22 0245  12/28/22 0125 12/30/22 0436 12/31/22 0420  NA 140 137 134* 137  K 5.0 3.6 3.4* 3.5  CL 111 105 105 105  CO2 19* 22 22 23   GLUCOSE 138* 109* 94 89  BUN 24* 16 9 5*  CALCIUM 8.3* 7.6* 7.7* 8.0*  CREATININE 0.67 0.81 0.78 0.78  GFRNONAA >60 >60 >60 >60    LIVER FUNCTION TESTS: Recent Labs    07/10/22 1554 07/11/22 1142 12/26/22 1811 12/31/22 0420  BILITOT 4.1* 3.0* 1.1 0.8  AST 59* 69* 32 21  ALT 24 27 20 15   ALKPHOS 58 84 58 38  PROT 5.9* 5.8* 5.5* 4.7*  ALBUMIN 3.1* 2.7* 2.7* 2.2*    TUMOR MARKERS: Recent Labs    07/24/22 1432  AFPTM 5.1    Assessment and Plan:  Alcoholic cirrhosis with recurrent GI bleeding = s/p banding 12/27/22.   Risks and benefits of TIPS, BRTO and/or additional variceal embolization were discussed with the patient and/or the patient's family including, but not limited to, infection, bleeding, damage to adjacent structures, worsening hepatic and/or cardiac function, worsening and/or the development of altered mental status/encephalopathy, non-target embolization and death.   This interventional procedure involves the use of X-rays and because of the nature of the planned procedure, it is possible that we will have prolonged use of X-ray fluoroscopy.  Potential radiation risks to you include (but are not limited to) the following: - A slightly elevated risk for cancer  several years later in life. This risk is typically less than 0.5% percent. This risk is low in comparison to the normal incidence of human cancer, which is 33% for women and 50% for men according to the American Cancer Society. - Radiation induced injury can include skin redness, resembling a rash, tissue breakdown / ulcers and hair loss (which can be temporary or permanent).   The likelihood of either of these occurring depends on the difficulty of the procedure and whether you are sensitive to radiation due to previous procedures, disease, or genetic conditions.  IF your  procedure requires a prolonged use of radiation, you will be notified and given written instructions for further action.  It is your responsibility to monitor the irradiated area for the 2 weeks following the procedure and to notify your physician if you are concerned that you have suffered a radiation induced injury.    All of the patient's questions were answered, patient is agreeable to proceed.  Consent signed and in chart.  Anesthesia available on 01/03/23 at 9:30 am. Will plan for TIPS that day   Thank you for allowing our service to participate in Stephen Miranda 's care.  Akasha Melena S Coltrane Tugwell PA-C 12/31/2022 3:51 PM       I spent a total of    25 Minutes in face to face in clinical consultation, greater than 50% of which was counseling/coordinating care for TIPS

## 2022-12-31 NOTE — Progress Notes (Signed)
   12/31/22 1608  Spiritual Encounters  Type of Visit Initial  Care provided to: Patient  Conversation partners present during encounter Other (comment)  Reason for visit Routine spiritual support  OnCall Visit No  Spiritual Framework  Community/Connection Family  Patient Stress Factors Loss of control   The patient was awake and listening to a podcast when I entered the room. He was restless.  The patient's next test isn't until Friday and he wants to go home and return for the test.  I checked with the hospital staff who came into the room while I was there to see if that's possible. They will explore and talk with the patient.   The patient and I talked about the difficulty of waiting and about his wanting to be home.  He was sad and frustrated to have to wait, but understands.  Chaplain Tandi Hanko Lile=King

## 2022-12-31 NOTE — Progress Notes (Signed)
PHARMACIST - PHYSICIAN COMMUNICATION  DR:   Jerral Ralph  CONCERNING: IV to Oral Route Change Policy  RECOMMENDATION: This patient is receiving pantoprazole by the intravenous route.  Based on criteria approved by the Pharmacy and Therapeutics Committee, the intravenous medication(s) is/are being converted to the equivalent oral dose form(s).   DESCRIPTION: These criteria include: The patient is eating (either orally or via tube) and/or has been taking other orally administered medications for a least 24 hours The patient has no evidence of active gastrointestinal bleeding or impaired GI absorption (gastrectomy, short bowel, patient on TNA or NPO).  If you have questions about this conversion, please contact the Pharmacy Department  []   [phone] )  [facility] []   [phone] )  [facility] [x]   [phone] )  [facility] []   [phone] )  [facility] []   [phone] )  [facility]

## 2023-01-01 DIAGNOSIS — K920 Hematemesis: Secondary | ICD-10-CM | POA: Diagnosis not present

## 2023-01-01 DIAGNOSIS — K7031 Alcoholic cirrhosis of liver with ascites: Secondary | ICD-10-CM | POA: Diagnosis not present

## 2023-01-01 DIAGNOSIS — I8511 Secondary esophageal varices with bleeding: Secondary | ICD-10-CM | POA: Diagnosis not present

## 2023-01-01 LAB — CBC
HCT: 23.4 % — ABNORMAL LOW (ref 39.0–52.0)
Hemoglobin: 7.7 g/dL — ABNORMAL LOW (ref 13.0–17.0)
MCH: 30.7 pg (ref 26.0–34.0)
MCHC: 32.9 g/dL (ref 30.0–36.0)
MCV: 93.2 fL (ref 80.0–100.0)
Platelets: 82 10*3/uL — ABNORMAL LOW (ref 150–400)
RBC: 2.51 MIL/uL — ABNORMAL LOW (ref 4.22–5.81)
RDW: 15.8 % — ABNORMAL HIGH (ref 11.5–15.5)
WBC: 4 10*3/uL (ref 4.0–10.5)
nRBC: 0 % (ref 0.0–0.2)

## 2023-01-01 LAB — MAGNESIUM: Magnesium: 1.7 mg/dL (ref 1.7–2.4)

## 2023-01-01 LAB — BASIC METABOLIC PANEL
Anion gap: 7 (ref 5–15)
BUN: <5 mg/dL — ABNORMAL LOW (ref 6–20)
CO2: 23 mmol/L (ref 22–32)
Calcium: 7.8 mg/dL — ABNORMAL LOW (ref 8.9–10.3)
Chloride: 106 mmol/L (ref 98–111)
Creatinine, Ser: 0.78 mg/dL (ref 0.61–1.24)
GFR, Estimated: >60 mL/min (ref >60)
Glucose, Bld: 91 mg/dL (ref 70–99)
Potassium: 3.5 mmol/L (ref 3.5–5.1)
Sodium: 136 mmol/L (ref 135–145)

## 2023-01-01 MED ORDER — MAGNESIUM SULFATE 2 GM/50ML IV SOLN
2.0000 g | Freq: Once | INTRAVENOUS | Status: AC
  Administered 2023-01-01: 2 g via INTRAVENOUS
  Filled 2023-01-01: qty 50

## 2023-01-01 MED ORDER — POTASSIUM CHLORIDE CRYS ER 20 MEQ PO TBCR
40.0000 meq | EXTENDED_RELEASE_TABLET | Freq: Once | ORAL | Status: AC
  Administered 2023-01-01: 40 meq via ORAL
  Filled 2023-01-01: qty 2

## 2023-01-01 NOTE — Progress Notes (Addendum)
     Attending physician's note   I have reviewed the chart and discussed his care on rounds. I performed a substantive portion of this encounter, including complete performance of at least one of the key components, in conjunction with the APP. I agree with the APP's note, impression, and recommendations with my edits.   Awaiting TIPS scheduled for 01/03/2023.  GI service will follow peripherally at this juncture.  Please do not hesitate to contact us with any additional questions or concerns.  Will otherwise start making arrangements for outpatient follow-up.  Evonna Stoltz, DO, FACG [phone] office          Progress Note   Subjective  Length of stay 6 days Chief Complaint: Hematemesis/alcoholic cirrhosis  Patient doing well this morning.  Denies any new complaints or concerns.   Objective   Vital signs in last 24 hours: Temp:  [98.1 F (36.7 C)-99.5 F (37.5 C)] 98.7 F (37.1 C) (06/12 0808) Pulse Rate:  [72-88] 83 (06/12 0808) Resp:  [15-19] 19 (06/12 0808) BP: (96-106)/(52-64) 106/64 (06/12 0808) SpO2:  [95 %-100 %] 95 % (06/12 0808) Weight:  [87.2 kg-122.3 kg] 122.3 kg (06/12 0523) Last BM Date : 12/28/22 General:    white male in NAD Heart:  Regular rate and rhythm; no murmurs Lungs: Respirations even and unlabored, lungs CTA bilaterally Abdomen:  Soft, nontender and nondistended. Normal bowel sounds. Psych:  Cooperative. Normal mood and affect.  Intake/Output from previous day: 06/11 0701 - 06/12 0700 In: -  Out: 300 [Urine:300]   Lab Results: Recent Labs    12/30/22 0436 12/31/22 0420 01/01/23 0349  WBC 4.2 3.4* 4.0  HGB 7.7* 7.7* 7.7*  HCT 22.7* 23.7* 23.4*  PLT 58* 64* 82*   BMET Recent Labs    12/30/22 0436 12/31/22 0420 01/01/23 0349  NA 134* 137 136  K 3.4* 3.5 3.5  CL 105 105 106  CO2 22 23 23   GLUCOSE 94 89 91  BUN 9 5* <5*  CREATININE 0.78 0.78 0.78  CALCIUM 7.7* 8.0* 7.8*   LFT Recent Labs    12/31/22 0420  PROT  4.7*  ALBUMIN 2.2*  AST 21  ALT 15  ALKPHOS 38  BILITOT 0.8    Assessment / Plan:   Assessment: 1.  Recurrent GI bleed from esophageal varices status post esophageal variceal band ligation 12/27/2022: MELD 3.0 11, hemoglobin stable at 7.7, platelets 82, improved, awaiting TIPS by IR plan for Friday, 01/03/2023  Plan: 1.  Continue Rocephin for total of 5 days 2.  Continue high-dose PPI 3.  Continue daily labs  GI service will follow peripherally, please call and let us know if you have any acute complaints or concerns   LOS: 6 days   Unk Lightning  01/01/2023, 11:34 AM

## 2023-01-01 NOTE — Progress Notes (Signed)
PROGRESS NOTE        PATIENT DETAILS Name: Stephen Miranda Age: 52 y.o. Sex: male Date of Birth: 19-Jan-1971 Admit Date: 12/26/2022 Admitting Physician Cheri Fowler, MD UJW:[id number], Lanora Manis, MD  Brief Summary: Patient is a 52 y.o.  male with history of alcoholic liver cirrhosis-presented with hemorrhagic shock in the setting of upper GI bleed with acute blood loss anemia.  Admitted by PCCM to the ICU-required pressors-and numerous units of PRBC.  Evaluated by GI-underwent EGD with banding.  Stabilized and subsequently transferred to [facility] on 6/9.  Significant events: 6/6>> admit to ICU by PCCM-upper GI bleed-with ABLA/hemorrhagic shock.  Intubated for EGD.  EGD with banding. 6/7>> extubated 6/9>> transferred to [facility]  Significant studies: 6/6>> CXR: No acute abnormality 6/9>> CT angio BRTO protocol: Successful prior coil assisted occluded retrograde transvenous obliteration of gastric varices, portal hypertension.  Hepatic cirrhosis. 6/10>> echo: EF 60-65%, normal pulmonary systolic pressure.  Significant microbiology data: None  Procedures: 6/6>> EGD: Large esophageal varices with stigmata of recent bleeding-banding performed-clotted blood in the gastric fundus. 6/6-6/7 ETT  Consults: None  Subjective: Finally had 2 BMs yesterday-1 was brown-the other had an old appearing black clot.  Hb stable.  No abdominal pain.  Objective: Vitals: Blood pressure 100/60, pulse 77, temperature 98 F (36.7 C), temperature source Oral, resp. rate 16, height 6\' 2"  (1.88 m), weight 122.3 kg, SpO2 95 %.   Exam: Gen Exam:Alert awake-not in any distress HEENT:atraumatic, normocephalic Chest: B/L clear to auscultation anteriorly CVS:S1S2 regular Abdomen:soft non tender, non distended Extremities:no edema Neurology: Non focal Skin: no rash  Pertinent Labs/Radiology:    Latest Ref Rng & Units 01/01/2023    3:49 AM 12/31/2022    4:20 AM 12/30/2022    4:36 AM  CBC  WBC  4.0 - 10.5 K/uL 4.0  3.4  4.2   Hemoglobin 13.0 - 17.0 g/dL 7.7  7.7  7.7   Hematocrit 39.0 - 52.0 % 23.4  23.7  22.7   Platelets 150 - 400 K/uL 82  64  58     Lab Results  Component Value Date   NA 136 01/01/2023   K 3.5 01/01/2023   CL 106 01/01/2023   CO2 23 01/01/2023      Assessment/Plan: Hemorrhagic shock due to upper GI bleeding from variceal bleed Acute blood loss anemia GI bleeding has resolved Hb stable-has required a total of 2 units of PRBC so far Completed octreotide x 72 hours On PPI GI following TIPS scheduled for this Friday.  Alcoholic liver cirrhosis Prior GI bleeding-s/p BRTO in 2023 Thrombocytopenia Coagulopathy Rocephin x 5 days for SBP prophylaxis Diuretics/beta-blocker on hold-as BP soft-on low-dose midodrine Platelet count slowly improving   Hypokalemia Repleted  Hypomagnesemia Continue to replete/recheck  BMI: Estimated body mass index is 34.62 kg/m as calculated from the following:   Height as of this encounter: 6\' 2"  (1.88 m).   Weight as of this encounter: 122.3 kg.   Code status:   Code Status: Full Code   DVT Prophylaxis: SCDs Start: 12/26/22 1959   Family Communication: None at bedside   Disposition Plan: Status is: Inpatient Remains inpatient appropriate because: Severity of illness   Planned Discharge Destination:Home   Diet: Diet Order             Diet full liquid Room service appropriate? Yes; Fluid consistency: Thin  Diet effective now  Antimicrobial agents: Anti-infectives (From admission, onward)    Start     Dose/Rate Route Frequency Ordered Stop   12/26/22 2230  cefTRIAXone (ROCEPHIN) 1 g in sodium chloride 0.9 % 100 mL IVPB        1 g 200 mL/hr over 30 Minutes Intravenous Every 24 hours 12/26/22 2141 01/01/23 1024        MEDICATIONS: Scheduled Meds:  Chlorhexidine Gluconate Cloth  6 each Topical Daily   midodrine  5 mg Oral BID WC   pantoprazole  40 mg Oral BID    sodium chloride flush  10-40 mL Intracatheter Q12H   Continuous Infusions:  sodium chloride Stopped (12/28/22 0922)   PRN Meds:.acetaminophen, docusate sodium, magic mouthwash w/lidocaine, ondansetron (ZOFRAN) IV, mouth rinse, polyethylene glycol, sodium chloride flush   I have personally reviewed following labs and imaging studies  LABORATORY DATA: CBC: Recent Labs  Lab 12/26/22 1811 12/27/22 0016 12/28/22 1743 12/29/22 0147 12/29/22 1230 12/29/22 2130 12/30/22 0436 12/31/22 0420 01/01/23 0349  WBC 7.2   < > 7.4 5.4  --   --  4.2 3.4* 4.0  NEUTROABS 5.0  --   --   --   --   --   --   --   --   HGB 11.9*   < > 8.6* 7.4* 7.6* 8.1* 7.7* 7.7* 7.7*  HCT 37.2*   < > 26.6* 22.3* 23.4* 24.8* 22.7* 23.7* 23.4*  MCV 95.9   < > 94.7 92.9  --   --  92.7 93.7 93.2  PLT 113*   < > 47* 43*  --   --  58* 64* 82*   < > = values in this interval not displayed.     Basic Metabolic Panel: Recent Labs  Lab 12/27/22 0245 12/28/22 0125 12/30/22 0436 12/31/22 0420 01/01/23 0349  NA 140 137 134* 137 136  K 5.0 3.6 3.4* 3.5 3.5  CL 111 105 105 105 106  CO2 19* 22 22 23 23   GLUCOSE 138* 109* 94 89 91  BUN 24* 16 9 5* <5*  CREATININE 0.67 0.81 0.78 0.78 0.78  CALCIUM 8.3* 7.6* 7.7* 8.0* 7.8*  MG 1.5*  --   --  1.5* 1.7  PHOS 4.0  --   --   --   --      GFR: Estimated Creatinine Clearance: 151.7 mL/min (by C-G formula based on SCr of 0.78 mg/dL).  Liver Function Tests: Recent Labs  Lab 12/26/22 1811 12/31/22 0420  AST 32 21  ALT 20 15  ALKPHOS 58 38  BILITOT 1.1 0.8  PROT 5.5* 4.7*  ALBUMIN 2.7* 2.2*    Recent Labs  Lab 12/26/22 1811  LIPASE 28    No results for input(s): "AMMONIA" in the last 168 hours.  Coagulation Profile: Recent Labs  Lab 12/26/22 1810 12/27/22 0245  INR 1.5* 1.4*     Cardiac Enzymes: No results for input(s): "CKTOTAL", "CKMB", "CKMBINDEX", "TROPONINI" in the last 168 hours.  BNP (last 3 results) No results for input(s): "PROBNP" in  the last 8760 hours.  Lipid Profile: No results for input(s): "CHOL", "HDL", "LDLCALC", "TRIG", "CHOLHDL", "LDLDIRECT" in the last 72 hours.  Thyroid Function Tests: No results for input(s): "TSH", "T4TOTAL", "FREET4", "T3FREE", "THYROIDAB" in the last 72 hours.  Anemia Panel: No results for input(s): "VITAMINB12", "FOLATE", "FERRITIN", "TIBC", "IRON", "RETICCTPCT" in the last 72 hours.  Urine analysis:    Component Value Date/Time   COLORURINE YELLOW 12/26/2022 2045   APPEARANCEUR CLEAR 12/26/2022 2045  LABSPEC 1.019 12/26/2022 2045   PHURINE 6.0 12/26/2022 2045   GLUCOSEU NEGATIVE 12/26/2022 2045   HGBUR NEGATIVE 12/26/2022 2045   BILIRUBINUR NEGATIVE 12/26/2022 2045   KETONESUR 5 (A) 12/26/2022 2045   PROTEINUR NEGATIVE 12/26/2022 2045   NITRITE NEGATIVE 12/26/2022 2045   LEUKOCYTESUR NEGATIVE 12/26/2022 2045    Sepsis Labs: Lactic Acid, Venous    Component Value Date/Time   LATICACIDVEN 1.7 07/03/2022 3244    MICROBIOLOGY: Recent Results (from the past 240 hour(s))  MRSA Next Gen by PCR, Nasal     Status: None   Collection Time: 12/27/22  9:16 AM   Specimen: Nasal Mucosa; Nasal Swab  Result Value Ref Range Status   MRSA by PCR Next Gen NOT DETECTED NOT DETECTED Final    Comment: (NOTE) The GeneXpert MRSA Assay (FDA approved for NASAL specimens only), is one component of a comprehensive MRSA colonization surveillance program. It is not intended to diagnose MRSA infection nor to guide or monitor treatment for MRSA infections. Test performance is not FDA approved in patients less than 32 years old. Performed at [facility], [address] [place], [place] [address]     RADIOLOGY STUDIES/RESULTS: No results found.   LOS: 6 days   Jeoffrey Massed, MD  [facility]    To contact the attending provider between 7A-7P or the covering provider during after hours 7P-7A, please log into the web site [email or web address] and access using universal [facility] password for that web site. If you do not have the password, please call the hospital operator.  01/01/2023, 1:13 PM

## 2023-01-01 NOTE — Progress Notes (Signed)
   01/01/23 1530  Spiritual Encounters  Type of Visit Follow up  Care provided to: Patient  Reason for visit Routine spiritual support  OnCall Visit No   I followed up with the patient after my initial visit yesterday. He is passing the time waiting for a test on Friday.  He watches videos and his parents visit him daily.  He hopes to have the test on Friday and go home on Saturday.  I listened as he talked about how difficult it is to wait.  We talked about strategies he can use to occupy his time as he waits over the next two days.   He isn't anxious occasionally but mostly at peace.  He hopes to find a better job when he returns home and is feeling better.  Chaplain [place]

## 2023-01-02 ENCOUNTER — Other Ambulatory Visit: Payer: Self-pay | Admitting: Internal Medicine

## 2023-01-02 ENCOUNTER — Other Ambulatory Visit: Payer: Self-pay | Admitting: Student

## 2023-01-02 DIAGNOSIS — I8500 Esophageal varices without bleeding: Secondary | ICD-10-CM

## 2023-01-02 DIAGNOSIS — I8511 Secondary esophageal varices with bleeding: Secondary | ICD-10-CM | POA: Diagnosis not present

## 2023-01-02 DIAGNOSIS — K920 Hematemesis: Secondary | ICD-10-CM | POA: Diagnosis not present

## 2023-01-02 MED ORDER — SODIUM CHLORIDE 0.9 % IV SOLN
2.0000 g | INTRAVENOUS | Status: AC
  Administered 2023-01-03: 2 g via INTRAVENOUS
  Filled 2023-01-02: qty 20

## 2023-01-02 NOTE — Progress Notes (Signed)
PROGRESS NOTE        PATIENT DETAILS Name: Stephen Miranda Age: 52 y.o. Sex: male Date of Birth: [birth year] Admit Date: 12/26/2022 Admitting Physician Cheri Fowler, MD WUJ:[id number], Lanora Manis, MD  Brief Summary: Patient is a 52 y.o.  male with history of alcoholic liver cirrhosis-presented with hemorrhagic shock in the setting of upper GI bleed with acute blood loss anemia.  Admitted by PCCM to the ICU-required pressors-and numerous units of PRBC.  Evaluated by GI-underwent EGD with banding.  Stabilized and subsequently transferred to [facility] on 6/9.  Significant events: 6/6>> admit to ICU by PCCM-upper GI bleed-with ABLA/hemorrhagic shock.  Intubated for EGD.  EGD with banding. 6/7>> extubated 6/9>> transferred to [facility]  Significant studies: 6/6>> CXR: No acute abnormality 6/9>> CT angio BRTO protocol: Successful prior coil assisted occluded retrograde transvenous obliteration of gastric varices, portal hypertension.  Hepatic cirrhosis. 6/10>> echo: EF 60-65%, normal pulmonary systolic pressure.  Significant microbiology data: None  Procedures: 6/6>> EGD: Large esophageal varices with stigmata of recent bleeding-banding performed-clotted blood in the gastric fundus. 6/6-6/7 ETT  Consults: None  Subjective: Lying comfortably in bed-no further black stools.  No major issues overnight.  Awaiting TIPS procedure-scheduled for tomorrow.  Objective: Vitals: Blood pressure (!) 103/59, pulse 81, temperature 98.4 F (36.9 C), temperature source Oral, resp. rate 18, height 6\' 2"  (1.88 m), weight 127.8 kg, SpO2 94 %.   Exam: Gen Exam:Alert awake-not in any distress HEENT:atraumatic, normocephalic Chest: B/L clear to auscultation anteriorly CVS:S1S2 regular Abdomen:soft non tender, non distended Extremities:no edema Neurology: Non focal Skin: no rash  Pertinent Labs/Radiology:    Latest Ref Rng & Units 01/01/2023    3:49 AM 12/31/2022    4:20 AM 12/30/2022    4:36  AM  CBC  WBC 4.0 - 10.5 K/uL 4.0  3.4  4.2   Hemoglobin 13.0 - 17.0 g/dL 7.7  7.7  7.7   Hematocrit 39.0 - 52.0 % 23.4  23.7  22.7   Platelets 150 - 400 K/uL 82  64  58     Lab Results  Component Value Date   NA 136 01/01/2023   K 3.5 01/01/2023   CL 106 01/01/2023   CO2 23 01/01/2023      Assessment/Plan: Hemorrhagic shock due to upper GI bleeding from variceal bleed Acute blood loss anemia GI bleeding has resolved Hb stable-has required a total of 2 units of PRBC so far Completed octreotide x 72 hours On PPI GI following TIPS scheduled for this Friday.  Alcoholic liver cirrhosis Prior GI bleeding-s/p BRTO in 2023 Thrombocytopenia Coagulopathy Completed Rocephin x 5 days for SBP prophylaxis Diuretics/beta-blocker on hold  BP soft-but better-on low-dose midodrine Platelet count slowly improving   Hypokalemia Repleted  Hypomagnesemia Continue to replete/recheck  BMI: Estimated body mass index is 36.17 kg/m as calculated from the following:   Height as of this encounter: 6\' 2"  (1.88 m).   Weight as of this encounter: 127.8 kg.   Code status:   Code Status: Full Code   DVT Prophylaxis: SCDs Start: 12/26/22 1959   Family Communication: None at bedside   Disposition Plan: Status is: Inpatient Remains inpatient appropriate because: Severity of illness   Planned Discharge Destination:Home   Diet: Diet Order             Diet full liquid Room service appropriate? Yes; Fluid consistency: Thin  Diet effective  now                     Antimicrobial agents: Anti-infectives (From admission, onward)    Start     Dose/Rate Route Frequency Ordered Stop   12/26/22 2230  cefTRIAXone (ROCEPHIN) 1 g in sodium chloride 0.9 % 100 mL IVPB        1 g 200 mL/hr over 30 Minutes Intravenous Every 24 hours 12/26/22 2141 01/01/23 1024        MEDICATIONS: Scheduled Meds:  Chlorhexidine Gluconate Cloth  6 each Topical Daily   midodrine  5 mg Oral BID WC    pantoprazole  40 mg Oral BID   sodium chloride flush  10-40 mL Intracatheter Q12H   Continuous Infusions:  sodium chloride Stopped (12/28/22 0922)   PRN Meds:.acetaminophen, docusate sodium, magic mouthwash w/lidocaine, ondansetron (ZOFRAN) IV, mouth rinse, polyethylene glycol, sodium chloride flush   I have personally reviewed following labs and imaging studies  LABORATORY DATA: CBC: Recent Labs  Lab 12/26/22 1811 12/27/22 0016 12/28/22 1743 12/29/22 0147 12/29/22 1230 12/29/22 2130 12/30/22 0436 12/31/22 0420 01/01/23 0349  WBC 7.2   < > 7.4 5.4  --   --  4.2 3.4* 4.0  NEUTROABS 5.0  --   --   --   --   --   --   --   --   HGB 11.9*   < > 8.6* 7.4* 7.6* 8.1* 7.7* 7.7* 7.7*  HCT 37.2*   < > 26.6* 22.3* 23.4* 24.8* 22.7* 23.7* 23.4*  MCV 95.9   < > 94.7 92.9  --   --  92.7 93.7 93.2  PLT 113*   < > 47* 43*  --   --  58* 64* 82*   < > = values in this interval not displayed.     Basic Metabolic Panel: Recent Labs  Lab 12/27/22 0245 12/28/22 0125 12/30/22 0436 12/31/22 0420 01/01/23 0349  NA 140 137 134* 137 136  K 5.0 3.6 3.4* 3.5 3.5  CL 111 105 105 105 106  CO2 19* 22 22 23 23   GLUCOSE 138* 109* 94 89 91  BUN 24* 16 9 5* <5*  CREATININE 0.67 0.81 0.78 0.78 0.78  CALCIUM 8.3* 7.6* 7.7* 8.0* 7.8*  MG 1.5*  --   --  1.5* 1.7  PHOS 4.0  --   --   --   --      GFR: Estimated Creatinine Clearance: 155.1 mL/min (by C-G formula based on SCr of 0.78 mg/dL).  Liver Function Tests: Recent Labs  Lab 12/26/22 1811 12/31/22 0420  AST 32 21  ALT 20 15  ALKPHOS 58 38  BILITOT 1.1 0.8  PROT 5.5* 4.7*  ALBUMIN 2.7* 2.2*    Recent Labs  Lab 12/26/22 1811  LIPASE 28    No results for input(s): "AMMONIA" in the last 168 hours.  Coagulation Profile: Recent Labs  Lab 12/26/22 1810 12/27/22 0245  INR 1.5* 1.4*     Cardiac Enzymes: No results for input(s): "CKTOTAL", "CKMB", "CKMBINDEX", "TROPONINI" in the last 168 hours.  BNP (last 3 results) No  results for input(s): "PROBNP" in the last 8760 hours.  Lipid Profile: No results for input(s): "CHOL", "HDL", "LDLCALC", "TRIG", "CHOLHDL", "LDLDIRECT" in the last 72 hours.  Thyroid Function Tests: No results for input(s): "TSH", "T4TOTAL", "FREET4", "T3FREE", "THYROIDAB" in the last 72 hours.  Anemia Panel: No results for input(s): "VITAMINB12", "FOLATE", "FERRITIN", "TIBC", "IRON", "RETICCTPCT" in the last 72 hours.  Urine analysis:    Component Value Date/Time   COLORURINE YELLOW 12/26/2022 2045   APPEARANCEUR CLEAR 12/26/2022 2045   LABSPEC 1.019 12/26/2022 2045   PHURINE 6.0 12/26/2022 2045   GLUCOSEU NEGATIVE 12/26/2022 2045   HGBUR NEGATIVE 12/26/2022 2045   BILIRUBINUR NEGATIVE 12/26/2022 2045   KETONESUR 5 (A) 12/26/2022 2045   PROTEINUR NEGATIVE 12/26/2022 2045   NITRITE NEGATIVE 12/26/2022 2045   LEUKOCYTESUR NEGATIVE 12/26/2022 2045    Sepsis Labs: Lactic Acid, Venous    Component Value Date/Time   LATICACIDVEN 1.7 07/03/2022 1610    MICROBIOLOGY: Recent Results (from the past 240 hour(s))  MRSA Next Gen by PCR, Nasal     Status: None   Collection Time: 12/27/22  9:16 AM   Specimen: Nasal Mucosa; Nasal Swab  Result Value Ref Range Status   MRSA by PCR Next Gen NOT DETECTED NOT DETECTED Final    Comment: (NOTE) The GeneXpert MRSA Assay (FDA approved for NASAL specimens only), is one component of a comprehensive MRSA colonization surveillance program. It is not intended to diagnose MRSA infection nor to guide or monitor treatment for MRSA infections. Test performance is not FDA approved in patients less than 63 years old. Performed at [facility], [address], [place], [place] [address]     RADIOLOGY STUDIES/RESULTS: No results found.   LOS: 7 days   Jeoffrey Massed, MD  [facility]    To contact the attending provider between 7A-7P or the covering provider during after hours 7P-7A, please log into the web site [email or web address]  and access using universal [facility] password for that web site. If you do not have the password, please call the hospital operator.  01/02/2023, 10:22 AM

## 2023-01-03 ENCOUNTER — Inpatient Hospital Stay (HOSPITAL_COMMUNITY): Payer: Medicaid Other | Admitting: Certified Registered Nurse Anesthetist

## 2023-01-03 ENCOUNTER — Encounter (HOSPITAL_COMMUNITY): Payer: Self-pay | Admitting: Internal Medicine

## 2023-01-03 ENCOUNTER — Other Ambulatory Visit: Payer: Self-pay | Admitting: Student

## 2023-01-03 ENCOUNTER — Inpatient Hospital Stay (HOSPITAL_COMMUNITY): Payer: Medicaid Other

## 2023-01-03 ENCOUNTER — Encounter (HOSPITAL_COMMUNITY)
Admission: EM | Disposition: A | Discharge status: Home / Self Care | Payer: Self-pay | Source: Home / Self Care | Attending: Internal Medicine

## 2023-01-03 DIAGNOSIS — K7030 Alcoholic cirrhosis of liver without ascites: Secondary | ICD-10-CM

## 2023-01-03 DIAGNOSIS — F17210 Nicotine dependence, cigarettes, uncomplicated: Secondary | ICD-10-CM

## 2023-01-03 DIAGNOSIS — I10 Essential (primary) hypertension: Secondary | ICD-10-CM

## 2023-01-03 DIAGNOSIS — K920 Hematemesis: Secondary | ICD-10-CM | POA: Diagnosis not present

## 2023-01-03 DIAGNOSIS — I8510 Secondary esophageal varices without bleeding: Secondary | ICD-10-CM

## 2023-01-03 DIAGNOSIS — I8511 Secondary esophageal varices with bleeding: Secondary | ICD-10-CM | POA: Diagnosis not present

## 2023-01-03 HISTORY — PX: RADIOLOGY WITH ANESTHESIA: SHX6223

## 2023-01-03 HISTORY — PX: IR TIPS: IMG2295

## 2023-01-03 HISTORY — PX: IR US GUIDE VASC ACCESS RIGHT: IMG2390

## 2023-01-03 LAB — COMPREHENSIVE METABOLIC PANEL
ALT: 13 U/L (ref 0–44)
AST: 25 U/L (ref 15–41)
Albumin: 2.4 g/dL — ABNORMAL LOW (ref 3.5–5.0)
Alkaline Phosphatase: 50 U/L (ref 38–126)
Anion gap: 8 (ref 5–15)
BUN: <5 mg/dL — ABNORMAL LOW (ref 6–20)
CO2: 23 mmol/L (ref 22–32)
Calcium: 8.1 mg/dL — ABNORMAL LOW (ref 8.9–10.3)
Chloride: 106 mmol/L (ref 98–111)
Creatinine, Ser: 0.77 mg/dL (ref 0.61–1.24)
GFR, Estimated: >60 mL/min (ref >60)
Glucose, Bld: 89 mg/dL (ref 70–99)
Potassium: 3.7 mmol/L (ref 3.5–5.1)
Sodium: 137 mmol/L (ref 135–145)
Total Bilirubin: 0.9 mg/dL (ref 0.3–1.2)
Total Protein: 4.9 g/dL — ABNORMAL LOW (ref 6.5–8.1)

## 2023-01-03 LAB — TYPE AND SCREEN
ABO/RH(D): O POS
Antibody Screen: NEGATIVE

## 2023-01-03 LAB — CBC
HCT: 24.6 % — ABNORMAL LOW (ref 39.0–52.0)
Hemoglobin: 7.9 g/dL — ABNORMAL LOW (ref 13.0–17.0)
MCH: 31 pg (ref 26.0–34.0)
MCHC: 32.1 g/dL (ref 30.0–36.0)
MCV: 96.5 fL (ref 80.0–100.0)
Platelets: 107 10*3/uL — ABNORMAL LOW (ref 150–400)
RBC: 2.55 MIL/uL — ABNORMAL LOW (ref 4.22–5.81)
RDW: 15.5 % (ref 11.5–15.5)
WBC: 4.6 10*3/uL (ref 4.0–10.5)
nRBC: 0 % (ref 0.0–0.2)

## 2023-01-03 LAB — PROTIME-INR
INR: 1.4 — ABNORMAL HIGH (ref 0.8–1.2)
Prothrombin Time: 17.5 seconds — ABNORMAL HIGH (ref 11.4–15.2)

## 2023-01-03 SURGERY — IR WITH ANESTHESIA
Anesthesia: General

## 2023-01-03 MED ORDER — PHENYLEPHRINE 80 MCG/ML (10ML) SYRINGE FOR IV PUSH (FOR BLOOD PRESSURE SUPPORT)
PREFILLED_SYRINGE | INTRAVENOUS | Status: DC | PRN
  Administered 2023-01-03: 240 ug via INTRAVENOUS
  Administered 2023-01-03: 40 ug via INTRAVENOUS

## 2023-01-03 MED ORDER — LACTULOSE 10 GM/15ML PO SOLN: 15.0000 g | Freq: Three times a day (TID) | ORAL | 0 refills | Status: DC

## 2023-01-03 MED ORDER — SODIUM CHLORIDE 0.9 % IV SOLN: INTRAVENOUS | Status: DC | PRN

## 2023-01-03 MED ORDER — SUGAMMADEX SODIUM 200 MG/2ML IV SOLN
INTRAVENOUS | Status: DC | PRN
  Administered 2023-01-03: 150 mg via INTRAVENOUS

## 2023-01-03 MED ORDER — CHLORHEXIDINE GLUCONATE 0.12 % MT SOLN: 15.0000 mL | Freq: Once | OROMUCOSAL | Status: AC

## 2023-01-03 MED ORDER — FENTANYL CITRATE (PF) 100 MCG/2ML IJ SOLN
25.0000 ug | INTRAMUSCULAR | Status: DC | PRN
  Administered 2023-01-03: 50 ug via INTRAVENOUS

## 2023-01-03 MED ORDER — FENTANYL CITRATE (PF) 100 MCG/2ML IJ SOLN
INTRAMUSCULAR | Status: AC
  Filled 2023-01-03: qty 2

## 2023-01-03 MED ORDER — CHLORHEXIDINE GLUCONATE 0.12 % MT SOLN
OROMUCOSAL | Status: AC
  Administered 2023-01-03: 15 mL via OROMUCOSAL
  Filled 2023-01-03: qty 15

## 2023-01-03 MED ORDER — ROCURONIUM BROMIDE 10 MG/ML (PF) SYRINGE
PREFILLED_SYRINGE | INTRAVENOUS | Status: DC | PRN
  Administered 2023-01-03: 60 mg via INTRAVENOUS
  Administered 2023-01-03: 10 mg via INTRAVENOUS
  Administered 2023-01-03: 40 mg via INTRAVENOUS

## 2023-01-03 MED ORDER — LIDOCAINE 2% (20 MG/ML) 5 ML SYRINGE
INTRAMUSCULAR | Status: DC | PRN
  Administered 2023-01-03: 50 mg via INTRAVENOUS

## 2023-01-03 MED ORDER — ORAL CARE MOUTH RINSE: 15.0000 mL | Freq: Once | OROMUCOSAL | Status: AC

## 2023-01-03 MED ORDER — DEXAMETHASONE SODIUM PHOSPHATE 10 MG/ML IJ SOLN
INTRAMUSCULAR | Status: DC | PRN
  Administered 2023-01-03: 10 mg via INTRAVENOUS

## 2023-01-03 MED ORDER — LIDOCAINE 5 % EX PTCH
1.0000 | MEDICATED_PATCH | CUTANEOUS | Status: DC
  Administered 2023-01-03: 1 via TRANSDERMAL
  Filled 2023-01-03: qty 1

## 2023-01-03 MED ORDER — OXYCODONE HCL 5 MG PO TABS
ORAL_TABLET | ORAL | Status: AC
  Filled 2023-01-03: qty 1

## 2023-01-03 MED ORDER — OXYCODONE HCL 5 MG PO TABS
5.0000 mg | ORAL_TABLET | Freq: Once | ORAL | Status: AC | PRN
  Administered 2023-01-03: 5 mg via ORAL

## 2023-01-03 MED ORDER — IOHEXOL 300 MG/ML  SOLN
150.0000 mL | Freq: Once | INTRAMUSCULAR | Status: AC | PRN
  Administered 2023-01-03: 43 mL via INTRAVENOUS

## 2023-01-03 MED ORDER — ONDANSETRON HCL 4 MG/2ML IJ SOLN
INTRAMUSCULAR | Status: DC | PRN
  Administered 2023-01-03: 4 mg via INTRAVENOUS

## 2023-01-03 MED ORDER — MEPERIDINE HCL 25 MG/ML IJ SOLN: 6.2500 mg | INTRAMUSCULAR | Status: DC | PRN

## 2023-01-03 MED ORDER — ALBUMIN HUMAN 5 % IV SOLN: INTRAVENOUS | Status: DC | PRN

## 2023-01-03 MED ORDER — DEXTROSE 5 % IV SOLN
INTRAVENOUS | Status: DC | PRN
  Administered 2023-01-03: 2 g via INTRAVENOUS

## 2023-01-03 MED ORDER — IOHEXOL 300 MG/ML  SOLN
100.0000 mL | Freq: Once | INTRAMUSCULAR | Status: AC | PRN
  Administered 2023-01-03: 43 mL via INTRAVENOUS

## 2023-01-03 MED ORDER — FENTANYL CITRATE (PF) 250 MCG/5ML IJ SOLN
INTRAMUSCULAR | Status: DC | PRN
  Administered 2023-01-03 (×4): 50 ug via INTRAVENOUS

## 2023-01-03 MED ORDER — PHENYLEPHRINE HCL-NACL 20-0.9 MG/250ML-% IV SOLN
INTRAVENOUS | Status: DC | PRN
  Administered 2023-01-03: 25 ug/min via INTRAVENOUS

## 2023-01-03 MED ORDER — LACTATED RINGERS IV SOLN: INTRAVENOUS | Status: DC

## 2023-01-03 MED ORDER — PROPOFOL 10 MG/ML IV BOLUS
INTRAVENOUS | Status: DC | PRN
  Administered 2023-01-03: 110 mg via INTRAVENOUS

## 2023-01-03 MED ORDER — LACTULOSE 10 GM/15ML PO SOLN
15.0000 g | Freq: Three times a day (TID) | ORAL | Status: DC
  Administered 2023-01-03 – 2023-01-04 (×3): 15 g via ORAL
  Filled 2023-01-03 (×3): qty 30

## 2023-01-03 MED ORDER — PROMETHAZINE HCL 25 MG/ML IJ SOLN: 6.2500 mg | INTRAMUSCULAR | Status: DC | PRN

## 2023-01-03 MED ORDER — OXYCODONE HCL 5 MG/5ML PO SOLN: 5.0000 mg | Freq: Once | ORAL | Status: AC | PRN

## 2023-01-03 MED ORDER — MIDAZOLAM HCL 2 MG/2ML IJ SOLN: 0.5000 mg | Freq: Once | INTRAMUSCULAR | Status: DC | PRN

## 2023-01-03 MED ORDER — OXYCODONE HCL 5 MG PO TABS
5.0000 mg | ORAL_TABLET | Freq: Four times a day (QID) | ORAL | Status: DC | PRN
  Filled 2023-01-03: qty 1

## 2023-01-03 NOTE — H&P (Signed)
Chief Complaint: Patient was seen in consultation today for  Chief Complaint  Patient presents with   Hematemesis    Referring Physician(s): Dr. Barron Alvine  Supervising Physician: Gilmer Mor  Patient Status: [facility] - In-pt  History of Present Illness: Stephen Miranda is a 52 y.o. male with a medical history significant for alcohol abuse and alcohol-related cirrhosis with esophageal varices and prior history of GI bleed. He presented to the ED 12/26/22 with hematemesis and is now s/p band ligation 12/27/22. Patient is familiar to IR from previous BRTO 07/04/22 by Dr. Miles Costain. IR asked to evaluate patient for TIPS due to recurrent UGI bleed. Procedure approved by Dr. Loreta Ave. IR consulted with patient 12/31/22. Please see full consult note dated 12/31/22,   Past Medical History:  Diagnosis Date   Chronic insomnia 05/23/2016   Has stated in past since age 54 yo, but worse in recent years with restless legs.   Elevated liver enzymes 04/24/2015   04/18/2015:  Miranda:  115     ALT:  90. Likely due to alcohol intake   Malignant melanoma of back (HCC) 02/07/2015   Left upper back:  Dr. Irene Limbo [facility]   Restless legs 05/23/2016   Seborrheic dermatitis 05/23/2016    Past Surgical History:  Procedure Laterality Date   ESOPHAGEAL BANDING  12/26/2022   Procedure: ESOPHAGEAL BANDING;  Surgeon: Imogene Burn, MD;  Location: [facility] ENDOSCOPY;  Service: Gastroenterology;;   ESOPHAGOGASTRODUODENOSCOPY N/A 07/03/2022   Procedure: ESOPHAGOGASTRODUODENOSCOPY (EGD);  Surgeon: Beverley Fiedler, MD;  Location[facility] ENDOSCOPY;  Service: Gastroenterology;  Laterality: N/A;   ESOPHAGOGASTRODUODENOSCOPY (EGD) WITH PROPOFOL N/A 05/20/2019   Procedure: ESOPHAGOGASTRODUODENOSCOPY (EGD) WITH PROPOFOL;  Surgeon: Meridee Score Netty Starring., MD;  Location: [facility] ENDOSCOPY;  Service: Gastroenterology;  Laterality: N/A;   ESOPHAGOGASTRODUODENOSCOPY (EGD) WITH PROPOFOL N/A 12/26/2022   Procedure:  ESOPHAGOGASTRODUODENOSCOPY (EGD) WITH PROPOFOL;  Surgeon: Imogene Burn, MD;  Location: [facility] ENDOSCOPY;  Service: Gastroenterology;  Laterality: N/A;   Excision Malignant Melanoma Left 02/07/2015   Left upper back   IR ANGIOGRAM SELECTIVE EACH ADDITIONAL VESSEL  07/04/2022   IR EMBO ART  VEN HEMORR LYMPH EXTRAV  INC GUIDE ROADMAPPING  07/04/2022   IR PARACENTESIS  07/18/2022   IR US GUIDE VASC ACCESS RIGHT  07/04/2022   IR VENOGRAM RENAL UNI LEFT  07/04/2022   PARACENTESIS  07/18/2022   RADIOLOGY WITH ANESTHESIA N/A 07/04/2022   Procedure: IR WITH ANESTHESIA - TIPS;  Surgeon: Radiologist, Medication, MD;  Location: [facility] OR;  Service: Radiology;  Laterality: N/A;    Allergies: Patient has no known allergies.  Medications: Prior to Admission medications   Medication Sig Start Date End Date Taking? Authorizing Provider  carvedilol (COREG) 3.125 MG tablet TAKE 1 TABLET BY MOUTH IN THE MORNING, and TAKE 2 TABLETS AT BEDTIME 12/16/22  Yes Julieanne Manson, MD  escitalopram (LEXAPRO) 5 MG tablet Take 1 tablet (5 mg total) by mouth daily. 11/18/22  Yes Julieanne Manson, MD  ferrous gluconate (FERGON) 324 MG tablet Take 324 mg by mouth daily with breakfast.   Yes [provider]  furosemide (LASIX) 20 MG tablet 1 tab by mouth daily in morning with Spironolactone 07/11/22  Yes Julieanne Manson, MD  Multiple Vitamin (MULTIVITAMIN WITH MINERALS) TABS tablet Take 1 tablet by mouth daily. 05/23/19  Yes Calvert Cantor, MD  pantoprazole (PROTONIX) 40 MG tablet Take 1 tablet (40 mg total) by mouth 2 (two) times daily. 07/11/22  Yes Julieanne Manson, MD  spironolactone (ALDACTONE) 50 MG tablet 1 tab by  mouth in morning with furosemide 07/11/22  Yes Julieanne Manson, MD  tamsulosin (FLOMAX) 0.4 MG CAPS capsule Take 1 capsule (0.4 mg total) by mouth daily after supper. 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  Tobacco Use   Smoking status: Every Day    Packs/day: 0.50    Years: 20.00    Additional pack years: 0.00    Total pack years: 10.00    Types: Cigarettes    Passive exposure: Never   Smokeless tobacco: Never   Tobacco comments:    prescribed Chantix in past, but did not take  Vaping Use   Vaping Use: Never used  Substance and Sexual Activity   Alcohol use: Not Currently    Alcohol/week: 4.0 standard drinks of alcohol    Types: 2 Cans of beer, 2 Shots of liquor per week    Comment: Nightly   Drug use: No   Sexual activity: Not on file  Other Topics Concern   Not on file  Social History Narrative   Currently lives with parents   Social Determinants of Health   Financial Resource Strain: Not on file  Food Insecurity: Not on file  Transportation Needs: Not on file  Physical Activity: Not on file  Stress: Not on file  Social Connections: Not on file    Review of Systems: A 12 point ROS discussed and pertinent positives are indicated in the HPI above.  All other systems are negative.  Review of Systems  Constitutional:  Negative for appetite change and fatigue.  Respiratory:  Negative for cough and shortness of breath.   Cardiovascular:  Negative for chest pain and leg swelling.  Gastrointestinal:  Negative for abdominal pain, diarrhea, nausea and vomiting.  Genitourinary:  Negative for flank pain.  Musculoskeletal:  Negative for back pain.  Neurological:  Negative for dizziness and headaches.    Vital Signs: BP 106/64   Pulse 72   Temp 98.5 F (36.9 C) (Oral)   Resp 17   Ht 6\' 2"  (1.88 m)   Wt 281 lb 12 oz (127.8 kg)    SpO2 97%   BMI 36.17 kg/m   Physical Exam Constitutional:      General: He is not in acute distress.    Appearance: He is not ill-appearing.  Pulmonary:     Effort: Pulmonary effort is normal.  Neurological:     Mental Status: He is alert and oriented to person, place, and time.  Psychiatric:        Mood and Affect: Mood normal.        Behavior: Behavior normal.        Thought Content: Thought content normal.        Judgment: Judgment normal.     Imaging: ECHOCARDIOGRAM COMPLETE  Result Date: 12/30/2022    ECHOCARDIOGRAM REPORT   Patient Name:   Stephen Miranda  Date of Exam: 12/30/2022 Medical Rec #:  [id number]  Height:       74.0 in Accession #:    [id number]  Weight:       192.2 lb Date of Birth:  02-11-1971   BSA:          2.137 m Patient Age:    52 years   BP:           94/60 mmHg Patient Gender: M          HR:           73 bpm. Exam Location:  Inpatient Procedure: 2D Echo, 3D Echo, Cardiac Doppler and Color Doppler Indications:    Z01.818 Encounter for other preprocedural examination  History:        Patient has no prior history of Echocardiogram examinations.                 Risk Factors:Hypertension. ETOH. Shock.  Sonographer:    Sheralyn Boatman RDCS Referring Phys: [id number] Werner Lean [facility] IMPRESSIONS  1. Left ventricular ejection fraction, by estimation, is 60 to 65%. Left ventricular ejection fraction by 3D volume is 61 %. The left ventricle has normal function. The left ventricle has no regional wall motion abnormalities. Left ventricular diastolic  parameters were normal.  2. Right ventricular systolic function is normal. The right ventricular size is normal. There is normal pulmonary artery systolic pressure.  3. Left atrial size was mildly dilated.  4. The mitral valve is normal in structure. Mild mitral valve regurgitation.  5. The aortic valve is tricuspid. Aortic valve regurgitation is not visualized. No aortic stenosis is present.  6. Aortic dilatation noted. There is mild dilatation of the  aortic root, measuring 43 mm.  7. The inferior vena cava is normal in size with greater than 50% respiratory variability, suggesting right atrial pressure of 3 mmHg. Comparison(s): No prior Echocardiogram. FINDINGS  Left Ventricle: There is a prominent false tendon present. Left ventricular ejection fraction, by estimation, is 60 to 65%. Left ventricular ejection fraction by 3D volume is 61 %. The left ventricle has normal function. The left ventricle has no regional wall motion abnormalities. The left ventricular internal cavity size was normal in size. There is no left ventricular hypertrophy. Left ventricular diastolic parameters were normal. Right Ventricle: The right ventricular size is normal. No increase in right ventricular wall thickness. Right ventricular systolic function is normal. There is normal pulmonary artery systolic pressure. The tricuspid regurgitant velocity is 1.75 m/s, and  with an assumed right atrial pressure of 3 mmHg, the estimated right ventricular systolic pressure is 15.2 mmHg. Left Atrium: Left atrial size was mildly dilated. Right Atrium: Right atrial size was normal in size. Pericardium: There is no evidence of pericardial effusion. Mitral Valve: The mitral valve is normal in structure. Mild mitral valve regurgitation. Tricuspid Valve: The tricuspid valve is normal in structure. Tricuspid valve regurgitation is trivial. Aortic Valve: The aortic valve is tricuspid. Aortic valve regurgitation is not visualized. No aortic stenosis is present. Pulmonic Valve: The pulmonic valve was normal in structure. Pulmonic valve regurgitation is trivial. Aorta: Aortic dilatation noted. There is mild dilatation of the aortic root, measuring 43 mm. Venous: The inferior vena cava is normal in size with greater than 50% respiratory variability, suggesting right atrial pressure of 3 mmHg. IAS/Shunts: The atrial septum is grossly normal.  LEFT VENTRICLE PLAX 2D LVIDd:         5.35 cm         Diastology  LVIDs:         3.50 cm         LV e' medial:  11.00 cm/s LV PW:         1.45 cm         LV E/e' medial:  6.8 LV IVS:        1.00 cm         LV e' lateral:   15.20 cm/s LVOT diam:     2.60 cm         LV E/e' lateral: 4.9 LV SV:         131 LV SV Index:   61 LVOT Area:     5.31 cm        3D Volume EF                                LV 3D EF:    Left                                             ventricul                                             ar                                             ejection                                             fraction                                             by 3D                                             volume is                                             61 %.                                 3D Volume EF:                                3D EF:        61 %                                LV EDV:       216 ml  LV ESV:       84 ml                                LV SV:        132 ml RIGHT VENTRICLE             IVC RV S prime:     14.70 cm/s  IVC diam: 1.70 cm TAPSE (M-mode): 2.2 cm LEFT ATRIUM             Index        RIGHT ATRIUM           Index LA diam:        4.30 cm 2.01 cm/m   RA Area:     15.70 cm LA Vol (A2C):   81.9 ml 38.32 ml/m  RA Volume:   39.10 ml  18.29 ml/m LA Vol (A4C):   56.0 ml 26.20 ml/m LA Biplane Vol: 67.8 ml 31.72 ml/m  AORTIC VALVE LVOT Vmax:   114.00 cm/s LVOT Vmean:  77.600 cm/s LVOT VTI:    0.247 m  AORTA Ao Root diam: 4.30 cm Ao Asc diam:  3.40 cm MITRAL VALVE               TRICUSPID VALVE MV Area (PHT): 3.48 cm    TR Peak grad:   12.2 mmHg MV Decel Time: 218 msec    TR Vmax:        175.00 cm/s MV E velocity: 74.50 cm/s MV A velocity: 56.05 cm/s  SHUNTS MV E/A ratio:  1.33        Systemic VTI:  0.25 m                            Systemic Diam: 2.60 cm Laurance Flatten MD Electronically signed by Laurance Flatten MD Signature Date/Time: 12/30/2022/12:19:43 PM    Final    CT ANGIO ABD/PELVIS BRTO  Result Date:  12/30/2022 CLINICAL DATA:  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 Patient underwent BRTO procedure in December of 2023. EXAM: CTA ABDOMEN AND PELVIS WITHOUT AND WITH CONTRAST TECHNIQUE: Multidetector CT imaging of the abdomen and pelvis was performed using the standard protocol during bolus administration of intravenous contrast. Multiplanar reconstructed images and MIPs were obtained and reviewed to evaluate the vascular anatomy. RADIATION DOSE REDUCTION: This exam was performed according to the departmental dose-optimization program which includes automated exposure control, adjustment of the mA and/or kV according to patient size and/or use of iterative reconstruction technique. CONTRAST:  OMNIPAQUE IOHEXOL 350 MG/ML SOLN COMPARISON:  Most recent prior CT scan of the abdomen and pelvis 07/05/2022 and 07/10/2022 FINDINGS: VASCULAR Aorta: Normal caliber aorta without aneurysm, dissection, vasculitis or significant stenosis. Trace atherosclerotic vascular calcifications. Celiac: Patent without evidence of aneurysm, dissection, vasculitis or significant stenosis. SMA: Patent without evidence of aneurysm, dissection, vasculitis or significant stenosis. Renals: Both renal arteries are patent without evidence of aneurysm, dissection, vasculitis, fibromuscular dysplasia or significant stenosis. IMA: Patent without evidence of aneurysm, dissection, vasculitis or significant stenosis. Inflow: Patent without evidence of aneurysm, dissection, vasculitis or significant stenosis. Proximal Outflow: Bilateral common femoral and visualized portions of the superficial and profunda femoral arteries are patent without evidence of aneurysm, dissection, vasculitis or significant stenosis. Veins: Coil embolization of gastro renal shunt. Interval obliteration of gastric varices. No residual varices seen in the upper abdomen. The portal  vein is patent. Recanalized periumbilical vein. Small  esophageal varices are present. Splenic vein not well evaluated due to streak artifact from the coil pack in the prior gastro renal shunt, however it appears patent. The SMV is widely patent. Retroaortic left renal vein noted incidentally. No evidence of DVT in the femoral or iliac veins. Review of the MIP images confirms the above findings. NON-VASCULAR Lower chest: Trace bilateral pleural effusions. Bilateral lower lobe atelectasis. The heart is normal in size. No pericardial effusion. Calcifications present along the coronary arteries. Hepatobiliary: Cirrhotic liver morphology. No evidence of a arterially enhancing lesion to suggest hepatocellular carcinoma. Multiple circumscribed low-attenuation lesions are again noted scattered throughout the liver consistent with simple cysts. Small stones are present within the gallbladder lumen. No biliary ductal dilatation. Pancreas: Unremarkable. No pancreatic ductal dilatation or surrounding inflammatory changes. Spleen: Splenomegaly. Adrenals/Urinary Tract: Normal adrenal glands. No hydronephrosis, nephrolithiasis or enhancing renal mass. Multiple circumscribed low-attenuation renal lesions are again seen without significant interval change. Findings are consistent with simple cysts. No imaging follow-up is recommended. Unremarkable ureters. The bladder is distended with urine. Stomach/Bowel: Diffuse submucosal edema present in the cecum, terminal ileum and ascending colon. 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 No evidence of bowel obstruction. Lymphatic: No suspicious. Reproductive: Prostate is unremarkable. Other: Moderate ascites.  Diffuse mesenteric edema. Musculoskeletal: No acute fracture or aggressive appearing lytic or blastic osseous lesion. IMPRESSION: VASCULAR 1. Successful prior coil assisted balloon occluded retrograde transvenous obliteration of gastric varices.  Gastric varices are completely resolved and the prior gastro renal shunt remains occluded. 2. Portal hypertension with small esophageal varices as expected. No new or clinically significant portosystemic collaterals. 3. No evidence of portal or visceral venous thrombosis. 4. Coronary artery and aortic atherosclerotic vascular calcifications. No acute arterial pathology. NON-VASCULAR 1. Hepatic cirrhosis with portal hypertension and moderate ascites. 2. 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 In the appropriate clinical setting, infectious/inflammatory colitis could appear similar. 3. Trace bilateral pleural effusions and associated lower lobe atelectasis. 4. Additional ancillary findings as above. Electronically Signed   By: Malachy Moan M.D.   On: 12/30/2022 08:11   [place] EKG SITE RITE  Result Date: 12/27/2022 If Site Rite image not attached, placement could not be confirmed due to current cardiac rhythm.  DG CHEST PORT 1 VIEW  Result Date: 12/27/2022 CLINICAL DATA:  Status post EGD EXAM: PORTABLE CHEST 1 VIEW COMPARISON:  Film from earlier in the same day. FINDINGS: Endotracheal tube is noted in satisfactory position. Cardiac shadow is stable. Changes of prior embolization in the left upper abdomen are seen. The lungs are well aerated bilaterally. No focal infiltrate or effusion is noted. IMPRESSION: No acute abnormality noted. Electronically Signed   By: Alcide Clever M.D.   On: 12/27/2022 00:53   DG CHEST PORT 1 VIEW  Result Date: 12/26/2022 CLINICAL DATA:  Check endotracheal tube placement EXAM: PORTABLE CHEST 1 VIEW COMPARISON:  07/03/2022 FINDINGS: Endotracheal tube is noted 6 cm above the carina. Cardiac shadow is within normal limits. Changes of prior embolotherapy are seen in the left upper abdomen. No focal infiltrate is seen. Bony structures are within normal limits. IMPRESSION: Endotracheal tube as described. No acute abnormality  noted. Electronically Signed   By: Alcide Clever M.D.   On: 12/26/2022 22:13    Labs:  CBC: Recent Labs    12/30/22 0436 12/31/22 0420 01/01/23 0349 01/03/23 6962  WBC 4.2 3.4* 4.0 4.6  HGB 7.7* 7.7* 7.7* 7.9*  HCT 22.7* 23.7* 23.4* 24.6*  PLT 58* 64* 82* 107*    COAGS: Recent Labs    07/10/22 1554 12/26/22 1810 12/27/22 0245 01/03/23 0438  INR 1.6* 1.5* 1.4* 1.4*  APTT  --   --  32  --     BMP: Recent Labs    12/30/22 0436 12/31/22 0420 01/01/23 0349 01/03/23 0438  NA 134* 137 136 137  K 3.4* 3.5 3.5 3.7  CL 105 105 106 106  CO2 22 23 23 23   GLUCOSE 94 89 91 89  BUN 9 5* <5* <5*  CALCIUM 7.7* 8.0* 7.8* 8.1*  CREATININE 0.78 0.78 0.78 0.77  GFRNONAA >60 >60 >60 >60    LIVER FUNCTION TESTS: Recent Labs    07/11/22 1142 12/26/22 1811 12/31/22 0420 01/03/23 0438  BILITOT 3.0* 1.1 0.8 0.9  Miranda 69* 32 21 25  ALT 27 20 15 13   ALKPHOS 84 58 38 50  PROT 5.8* 5.5* 4.7* 4.9*  ALBUMIN 2.7* 2.7* 2.2* 2.4*    TUMOR MARKERS: Recent Labs    07/24/22 1432  AFPTM 5.1    Assessment and Plan:  Alcoholic cirrhosis with varices and recurrent GI bleed: Stephen Miranda, 52 year old male, is scheduled today for an image-guided transjugular portosystemic shunt with possible varices embolization.   Risks and benefits of TIPS, BRTO and/or additional variceal embolization were discussed with the patient and/or the patient's family including, but not limited to, infection, bleeding, damage to adjacent structures, worsening hepatic and/or cardiac function, worsening and/or the development of altered mental status/encephalopathy, non-target embolization and death.   This interventional procedure involves the use of X-rays and because of the nature of the planned procedure, it is possible that we will have prolonged use of X-ray fluoroscopy.  Potential radiation risks to you include (but are not limited to) the following: - A slightly elevated risk for cancer  several years  later in life. This risk is typically less than 0.5% percent. This risk is low in comparison to the normal incidence of human cancer, which is 33% for women and 50% for men according to the American Cancer Society. - Radiation induced injury can include skin redness, resembling a rash, tissue breakdown / ulcers and hair loss (which can be temporary or permanent).   The likelihood of either of these occurring depends on the difficulty of the procedure and whether you are sensitive to radiation due to previous procedures, disease, or genetic conditions.   IF your procedure requires a prolonged use of radiation, you will be notified and given written instructions for further action.  It is your responsibility to monitor the irradiated area for the 2 weeks following the procedure and to notify your physician if you are concerned that you have suffered a radiation induced injury.    All of the patient's questions were answered, patient is agreeable to proceed.  Consent signed and in chart.  Thank you for this interesting consult.  I greatly enjoyed meeting Stephen Miranda and look forward to participating in their care.  A copy of this report was sent to the requesting provider on this date.  Electronically Signed: Alwyn Ren, AGACNP-BC [phone] 01/03/2023, 9:33 AM   I spent a total of 20 Minutes    in face to face in clinical consultation, greater than 50% of which was counseling/coordinating care for TIPS.

## 2023-01-03 NOTE — Transfer of Care (Signed)
Immediate Anesthesia Transfer of Care Note  Patient: Stephen Miranda  Procedure(s) Performed: TIPS WITH ANESTHESIA  Patient Location: PACU  Anesthesia Type:General  Level of Consciousness: awake, alert , oriented, patient cooperative, and responds to stimulation  Airway & Oxygen Therapy: Patient Spontanous Breathing and Patient connected to nasal cannula oxygen  Post-op Assessment: Report given to RN and Post -op Vital signs reviewed and stable  Post vital signs: Reviewed and stable  Last Vitals:  Vitals Value Taken Time  BP 114/67 01/03/23 1246  Temp 36.9 C 01/03/23 1245  Pulse 91 01/03/23 1249  Resp 12 01/03/23 1249  SpO2 93 % 01/03/23 1249  Vitals shown include unvalidated device data.  Last Pain:  Vitals:   01/03/23 0920  TempSrc: Oral  PainSc: 0-No pain      Patients Stated Pain Goal: 0 (01/03/23 0920)  Complications: No notable events documented.

## 2023-01-03 NOTE — Progress Notes (Signed)
PROGRESS NOTE        PATIENT DETAILS Name: Stephen Miranda Age: 52 y.o. Sex: male Date of Birth: 07/21/1971 Admit Date: 12/26/2022 Admitting Physician Cheri Fowler, MD RUE:[id number], Lanora Manis, MD  Brief Summary: Patient is a 52 y.o.  male with history of alcoholic liver cirrhosis-presented with hemorrhagic shock in the setting of upper GI bleed with acute blood loss anemia.  Admitted by PCCM to the ICU-required pressors-and numerous units of PRBC.  Evaluated by GI-underwent EGD with banding.  Stabilized and subsequently transferred to [facility] on 6/9.  Significant events: 6/6>> admit to ICU by PCCM-upper GI bleed-with ABLA/hemorrhagic shock.  Intubated for EGD.  EGD with banding. 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  Hepatic cirrhosis. 6/10>> echo: EF 60-65%, normal pulmonary systolic pressure.  Significant microbiology data: None  Procedures: 6/6>> EGD: Large esophageal varices with stigmata of recent bleeding-banding performed-clotted blood in the gastric fundus. 6/6-6/7 ETT  Consults: None  Subjective: No major issues overnight-lying comfortably in bed.  Has a chronic facial rash-that his PCP is trying to set him up with dermatology.  Objective: Vitals: Blood pressure 106/64, pulse 72, temperature 98.5 F (36.9 C), temperature source Oral, resp. rate 17, height 6\' 2"  (1.88 m), weight 127.8 kg, SpO2 97 %.   Exam: Gen Exam:Alert awake-not in any distress HEENT:atraumatic, normocephalic Chest: B/L clear to auscultation anteriorly CVS:S1S2 regular Abdomen:soft non tender, non distended Extremities:no edema Neurology: Non focal Skin: no rash  Pertinent Labs/Radiology:    Latest Ref Rng & Units 01/03/2023    4:38 AM 01/01/2023    3:49 AM 12/31/2022     4:20 AM  CBC  WBC 4.0 - 10.5 K/uL 4.6  4.0  3.4   Hemoglobin 13.0 - 17.0 g/dL 7.9  7.7  7.7   Hematocrit 39.0 - 52.0 % 24.6  23.4  23.7   Platelets 150 - 400 K/uL 107  82  64     Lab Results  Component Value Date   NA 137 01/03/2023   K 3.7 01/03/2023   CL 106 01/03/2023   CO2 23 01/03/2023      Assessment/Plan: Hemorrhagic shock due to upper GI bleeding from variceal bleed Acute blood loss anemia GI bleeding has resolved Hb stable-has required a total of 2 units of PRBC so far Completed octreotide x 72 hours On PPI GI following TIPS scheduled today  Alcoholic liver cirrhosis Prior GI bleeding-s/p BRTO in 2023 Thrombocytopenia Coagulopathy Completed Rocephin x 5 days for SBP prophylaxis Diuretics/beta-blocker on hold BP soft-but better-on low-dose midodrine Platelet count slowly improving   Hypokalemia Repleted  Hypomagnesemia Continue to replete/recheck  Erythematous/pruritic facial rash ?  Dermatitis Claims that PCP is trying to set him up with a dermatologist.  BMI: Estimated body mass index is 36.17 kg/m as calculated from the following:   Height as of this encounter: 6\' 2"  (1.88 m).   Weight as of this encounter: 127.8 kg.   Code status:   Code Status: Full Code   DVT Prophylaxis: SCDs Start: 12/26/22 1959   Family Communication: None at bedside   Disposition Plan: Status is: Inpatient Remains inpatient appropriate because: Severity of illness   Planned Discharge Destination:Home   Diet: Diet Order  Diet NPO time specified Except for: Sips with Meds  Diet effective midnight                     Antimicrobial agents: Anti-infectives (From admission, onward)    Start     Dose/Rate Route Frequency Ordered Stop   01/03/23 0900  [MAR Hold]  cefTRIAXone (ROCEPHIN) 2 g in sodium chloride 0.9 % 100 mL IVPB        (MAR Hold since Fri 01/03/2023 at 0905.Hold Reason: Transfer to a Procedural area)  Note to Pharmacy: To be given  prior to IR surgical procedure 6/14   2 g 200 mL/hr over 30 Minutes Intravenous To Radiology 01/02/23 1040 01/04/23 0900   12/26/22 2230  cefTRIAXone (ROCEPHIN) 1 g in sodium chloride 0.9 % 100 mL IVPB        1 g 200 mL/hr over 30 Minutes Intravenous Every 24 hours 12/26/22 2141 01/01/23 1024        MEDICATIONS: Scheduled Meds:  chlorhexidine  15 mL Mouth/Throat Once   Or   mouth rinse  15 mL Mouth Rinse Once   chlorhexidine       [MAR Hold] Chlorhexidine Gluconate Cloth  6 each Topical Daily   [MAR Hold] midodrine  5 mg Oral BID WC   [MAR Hold] pantoprazole  40 mg Oral BID   [MAR Hold] sodium chloride flush  10-40 mL Intracatheter Q12H   Continuous Infusions:  sodium chloride Stopped (12/28/22 0922)   [MAR Hold] cefTRIAXone (ROCEPHIN)  IV     lactated ringers     PRN Meds:.[MAR Hold] acetaminophen, chlorhexidine, [MAR Hold] docusate sodium, [MAR Hold] magic mouthwash w/lidocaine, [MAR Hold] ondansetron (ZOFRAN) IV, [MAR Hold] mouth rinse, [MAR Hold] polyethylene glycol, [MAR Hold] sodium chloride flush   I have personally reviewed following labs and imaging studies  LABORATORY DATA: CBC: Recent Labs  Lab 12/29/22 0147 12/29/22 1230 12/29/22 2130 12/30/22 0436 12/31/22 0420 01/01/23 0349 01/03/23 0438  WBC 5.4  --   --  4.2 3.4* 4.0 4.6  HGB 7.4*   < > 8.1* 7.7* 7.7* 7.7* 7.9*  HCT 22.3*   < > 24.8* 22.7* 23.7* 23.4* 24.6*  MCV 92.9  --   --  92.7 93.7 93.2 96.5  PLT 43*  --   --  58* 64* 82* 107*   < > = values in this interval not displayed.     Basic Metabolic Panel: Recent Labs  Lab 12/28/22 0125 12/30/22 0436 12/31/22 0420 01/01/23 0349 01/03/23 0438  NA 137 134* 137 136 137  K 3.6 3.4* 3.5 3.5 3.7  CL 105 105 105 106 106  CO2 22 22 23 23 23   GLUCOSE 109* 94 89 91 89  BUN 16 9 5* <5* <5*  CREATININE 0.81 0.78 0.78 0.78 0.77  CALCIUM 7.6* 7.7* 8.0* 7.8* 8.1*  MG  --   --  1.5* 1.7  --      GFR: Estimated Creatinine Clearance: 155.1 mL/min  (by C-G formula based on SCr of 0.77 mg/dL).  Liver Function Tests: Recent Labs  Lab 12/31/22 0420 01/03/23 0438  AST 21 25  ALT 15 13  ALKPHOS 38 50  BILITOT 0.8 0.9  PROT 4.7* 4.9*  ALBUMIN 2.2* 2.4*    No results for input(s): "LIPASE", "AMYLASE" in the last 168 hours.  No results for input(s): "AMMONIA" in the last 168 hours.  Coagulation Profile: Recent Labs  Lab 01/03/23 0438  INR 1.4*     Cardiac Enzymes: No  results for input(s): "CKTOTAL", "CKMB", "CKMBINDEX", "TROPONINI" in the last 168 hours.  BNP (last 3 results) No results for input(s): "PROBNP" in the last 8760 hours.  Lipid Profile: No results for input(s): "CHOL", "HDL", "LDLCALC", "TRIG", "CHOLHDL", "LDLDIRECT" in the last 72 hours.  Thyroid Function Tests: No results for input(s): "TSH", "T4TOTAL", "FREET4", "T3FREE", "THYROIDAB" in the last 72 hours.  Anemia Panel: No results for input(s): "VITAMINB12", "FOLATE", "FERRITIN", "TIBC", "IRON", "RETICCTPCT" in the last 72 hours.  Urine analysis:    Component Value Date/Time   COLORURINE YELLOW 12/26/2022 2045   APPEARANCEUR CLEAR 12/26/2022 2045   LABSPEC 1.019 12/26/2022 2045   PHURINE 6.0 12/26/2022 2045   GLUCOSEU NEGATIVE 12/26/2022 2045   HGBUR NEGATIVE 12/26/2022 2045   BILIRUBINUR NEGATIVE 12/26/2022 2045   KETONESUR 5 (A) 12/26/2022 2045   PROTEINUR NEGATIVE 12/26/2022 2045   NITRITE NEGATIVE 12/26/2022 2045   LEUKOCYTESUR NEGATIVE 12/26/2022 2045    Sepsis Labs: Lactic Acid, Venous    Component Value Date/Time   LATICACIDVEN 1.7 07/03/2022 1610    MICROBIOLOGY: Recent Results (from the past 240 hour(s))  MRSA Next Gen by PCR, Nasal     Status: None   Collection Time: 12/27/22  9:16 AM   Specimen: Nasal Mucosa; Nasal Swab  Result Value Ref Range Status   MRSA by PCR Next Gen NOT DETECTED NOT DETECTED Final    Comment: (NOTE) The GeneXpert MRSA Assay (FDA approved for NASAL specimens only), is one component of a  comprehensive MRSA colonization surveillance program. It is not intended to diagnose MRSA infection nor to guide or monitor treatment for MRSA infections. Test performance is not FDA approved in patients less than 34 years old. Performed at [facility], [address], [place], [place] [address]     RADIOLOGY STUDIES/RESULTS: No results found.   LOS: 8 days   Jeoffrey Massed, MD  [facility]    To contact the attending provider between 7A-7P or the covering provider during after hours 7P-7A, please log into the web site [email or web address] and access using universal [place] password for that web site. If you do not have the password, please call the hospital operator.  01/03/2023, 9:31 AM

## 2023-01-03 NOTE — Sedation Documentation (Signed)
Portal Vein measurement-22 per Dr Loreta Ave

## 2023-01-03 NOTE — Sedation Documentation (Addendum)
Post Portal shunt Portal vein pressure-16/gradient 8 per DrWagner

## 2023-01-03 NOTE — Sedation Documentation (Addendum)
Right Atrium pressure-8gradient 14 per Dr Loreta Ave

## 2023-01-03 NOTE — Anesthesia Preprocedure Evaluation (Addendum)
Anesthesia Evaluation  Patient identified by MRN, date of birth, ID band Patient awake    Reviewed: Allergy & Precautions, NPO status , Patient's Chart, lab work & pertinent test results, reviewed documented beta blocker date and time   History of Anesthesia Complications Negative for: history of anesthetic complications  Airway Mallampati: I  TM Distance: >3 FB Neck ROM: Full    Dental  (+) Dental Advisory Given   Pulmonary Current Smoker and Patient abstained from smoking. Intubated 6/5-6/7 for VDRF during hemorrhagic shock stabilization   breath sounds clear to auscultation       Cardiovascular hypertension, Pt. on medications and Pt. on home beta blockers (-) angina  Rhythm:Regular Rate:Normal  12/30/2022 ECHO: EF 60-65%, normal LVF, normal RVF, mild MR, mild dilation of aortic root, 43mm   Neuro/Psych negative neurological ROS  negative psych ROS   GI/Hepatic ,GERD  Medicated and Controlled,,(+) Cirrhosis   Esophageal Varices      Endo/Other  negative endocrine ROS    Renal/GU negative Renal ROS     Musculoskeletal   Abdominal   Peds  Hematology  (+) Blood dyscrasia (Hb 7.9, plt 107k), anemia INR 1.4   Anesthesia Other Findings alcoholic liver cirrhosis-presented with hemorrhagic shock in the setting of upper GI bleed with acute blood loss anemia.  Admitted by PCCM to the ICU-required pressors-and numerous units of PRBC.  Evaluated by GI-underwent EGD with banding.  Stabilized   Reproductive/Obstetrics                             Anesthesia Physical Anesthesia Plan  ASA: 4  Anesthesia Plan: General   Post-op Pain Management: Minimal or no pain anticipated   Induction: Intravenous  PONV Risk Score and Plan: 1 and Ondansetron and Dexamethasone  Airway Management Planned: Oral ETT  Additional Equipment: Arterial line  Intra-op Plan:   Post-operative Plan: Possible Post-op  intubation/ventilation  Informed Consent: I have reviewed the patients History and Physical, chart, labs and discussed the procedure including the risks, benefits and alternatives for the proposed anesthesia with the patient or authorized representative who has indicated his/her understanding and acceptance.     Dental advisory given  Plan Discussed with: CRNA and Surgeon  Anesthesia Plan Comments:         Anesthesia Quick Evaluation

## 2023-01-03 NOTE — Anesthesia Postprocedure Evaluation (Signed)
Anesthesia Post Note  Patient: Stephen Miranda  Procedure(s) Performed: TIPS WITH ANESTHESIA     Patient location during evaluation: PACU Anesthesia Type: General Level of consciousness: awake and alert, patient cooperative and oriented Pain management: pain level controlled Vital Signs Assessment: post-procedure vital signs reviewed and stable Respiratory status: spontaneous breathing, nonlabored ventilation and respiratory function stable Cardiovascular status: blood pressure returned to baseline and stable Postop Assessment: no apparent nausea or vomiting Anesthetic complications: no   No notable events documented.  Last Vitals:  Vitals:   01/03/23 1245 01/03/23 1300  BP: 114/67 126/68  Pulse: 92 87  Resp: 15 12  Temp: 36.9 C   SpO2: 93% 94%    Last Pain:  Vitals:   01/03/23 1245  TempSrc:   PainSc: 6                  Stephen Miranda,E. Lannette Avellino

## 2023-01-03 NOTE — Sedation Documentation (Signed)
Patient moved to supine on fluoro table, care per anesthesia

## 2023-01-03 NOTE — Progress Notes (Signed)
Upon arrival to Pre-Op, the pt presented with a red, raised rash to the face and hair line. Pt sts it is painful and itchy, and that he did not have this before entering the hospital.

## 2023-01-03 NOTE — Procedures (Addendum)
Interventional Radiology Procedure Note  Procedure:   US guided right internal jugular access US guided percutaneous portal access TIPS, 10cm x 8cm  Findings: Pre: portal vein = , Right Atrium = Post: portal vein = , Right Atrium = Final gradient =  Anesthesia: GETA  Complications: None  Recommendations:  - Right internal jugular wound care, dry dressing daily prn - right abd percutaneous access, dry dressing prn - Consider starting lactulose daily - VIR will follow - Stable to PACU    Signed,  Yvone Neu. Loreta Ave, DO

## 2023-01-03 NOTE — Anesthesia Procedure Notes (Signed)
Procedure Name: Intubation Date/Time: 01/03/2023 10:21 AM  Performed by: Lonia Mad, CRNAPre-anesthesia Checklist: Patient identified, Emergency Drugs available, Suction available and Patient being monitored Patient Re-evaluated:Patient Re-evaluated prior to induction Oxygen Delivery Method: Circle System Utilized Preoxygenation: Pre-oxygenation with 100% oxygen Induction Type: IV induction Ventilation: Mask ventilation without difficulty Laryngoscope Size: Mac and 4 Grade View: Grade I Tube type: Oral Tube size: 7.5 mm Number of attempts: 1 Airway Equipment and Method: Stylet and Oral airway Placement Confirmation: ETT inserted through vocal cords under direct vision, positive ETCO2 and breath sounds checked- equal and bilateral Secured at: 22 cm Tube secured with: Tape Dental Injury: Teeth and Oropharynx as per pre-operative assessment

## 2023-01-03 NOTE — Progress Notes (Signed)
Discussed with GI, placed patient on 15 mg  Lactulose TID.   15 mg Lactulose TID was sent to [facility] as well, [employer] notified.   IR to follow.  Please call IR for questions and concerns.    Lynann Bologna Lanea Vankirk PA-C 01/03/2023 1:31 PM

## 2023-01-03 NOTE — Anesthesia Procedure Notes (Signed)
Arterial Line Insertion Start/End6/14/2024 9:15 AM, 01/03/2023 9:20 AM Performed by: Lonia Mad, CRNA, CRNA  Patient location: Pre-op. Preanesthetic checklist: patient identified, IV checked, site marked, risks and benefits discussed, surgical consent, monitors and equipment checked, pre-op evaluation, timeout performed and anesthesia consent Lidocaine 1% used for infiltration Left, radial was placed Catheter size: 20 G Hand hygiene performed  and maximum sterile barriers used   Attempts: 1 Procedure performed without using ultrasound guided technique. Following insertion, dressing applied and Biopatch. Post procedure assessment: normal  Patient tolerated the procedure well with no immediate complications.

## 2023-01-03 NOTE — Plan of Care (Signed)
  Problem: Education: Goal: Ability to describe self-care measures that may prevent or decrease complications (Diabetes Survival Skills Education) will improve Outcome: Progressing Goal: Individualized Educational Video(s) Outcome: Progressing   Problem: Coping: Goal: Ability to adjust to condition or change in health will improve Outcome: Progressing   Problem: Fluid Volume: Goal: Ability to maintain a balanced intake and output will improve Outcome: Progressing   Problem: Health Behavior/Discharge Planning: Goal: Ability to identify and utilize available resources and services will improve Outcome: Progressing Goal: Ability to manage health-related needs will improve Outcome: Progressing   Problem: Metabolic: Goal: Ability to maintain appropriate glucose levels will improve Outcome: Progressing   Problem: Nutritional: Goal: Maintenance of adequate nutrition will improve Outcome: Progressing Goal: Progress toward achieving an optimal weight will improve Outcome: Progressing   Problem: Skin Integrity: Goal: Risk for impaired skin integrity will decrease Outcome: Progressing   Problem: Tissue Perfusion: Goal: Adequacy of tissue perfusion will improve Outcome: Progressing   Problem: Health Behavior/Discharge Planning: Goal: Ability to manage health-related needs will improve Outcome: Progressing   Problem: Clinical Measurements: Goal: Ability to maintain clinical measurements within normal limits will improve Outcome: Progressing Goal: Will remain free from infection Outcome: Progressing Goal: Diagnostic test results will improve Outcome: Progressing Goal: Respiratory complications will improve Outcome: Progressing Goal: Cardiovascular complication will be avoided Outcome: Progressing   Problem: Activity: Goal: Risk for activity intolerance will decrease Outcome: Progressing   Problem: Nutrition: Goal: Adequate nutrition will be maintained Outcome:  Progressing   Problem: Coping: Goal: Level of anxiety will decrease Outcome: Progressing   Problem: Elimination: Goal: Will not experience complications related to bowel motility Outcome: Progressing Goal: Will not experience complications related to urinary retention Outcome: Progressing   Problem: Pain Managment: Goal: General experience of comfort will improve Outcome: Progressing   Problem: Safety: Goal: Ability to remain free from injury will improve Outcome: Progressing   Problem: Skin Integrity: Goal: Risk for impaired skin integrity will decrease Outcome: Progressing   Problem: Education: Goal: Ability to identify signs and symptoms of gastrointestinal bleeding will improve Outcome: Progressing   Problem: Bowel/Gastric: Goal: Will show no signs and symptoms of gastrointestinal bleeding Outcome: Progressing   Problem: Fluid Volume: Goal: Will show no signs and symptoms of excessive bleeding Outcome: Progressing   Problem: Clinical Measurements: Goal: Complications related to the disease process, condition or treatment will be avoided or minimized Outcome: Progressing

## 2023-01-04 ENCOUNTER — Encounter (HOSPITAL_COMMUNITY): Payer: Self-pay | Admitting: Interventional Radiology

## 2023-01-04 DIAGNOSIS — K920 Hematemesis: Secondary | ICD-10-CM | POA: Diagnosis not present

## 2023-01-04 LAB — CBC
HCT: 28.1 % — ABNORMAL LOW (ref 39.0–52.0)
Hemoglobin: 9.1 g/dL — ABNORMAL LOW (ref 13.0–17.0)
MCH: 31 pg (ref 26.0–34.0)
MCHC: 32.4 g/dL (ref 30.0–36.0)
MCV: 95.6 fL (ref 80.0–100.0)
Platelets: 162 10*3/uL (ref 150–400)
RBC: 2.94 MIL/uL — ABNORMAL LOW (ref 4.22–5.81)
RDW: 15.2 % (ref 11.5–15.5)
WBC: 9 10*3/uL (ref 4.0–10.5)
nRBC: 0 % (ref 0.0–0.2)

## 2023-01-04 MED ORDER — MIDODRINE HCL 5 MG PO TABS: 5.0000 mg | ORAL_TABLET | Freq: Two times a day (BID) | ORAL | 0 refills | Status: DC

## 2023-01-04 MED ORDER — LACTULOSE 10 GM/15ML PO SOLN: 15.0000 g | Freq: Three times a day (TID) | ORAL | 0 refills | Status: AC

## 2023-01-04 MED ORDER — POTASSIUM CHLORIDE CRYS ER 20 MEQ PO TBCR
40.0000 meq | EXTENDED_RELEASE_TABLET | Freq: Once | ORAL | Status: AC
  Administered 2023-01-04: 40 meq via ORAL
  Filled 2023-01-04: qty 2

## 2023-01-04 MED ORDER — MAGNESIUM SULFATE 2 GM/50ML IV SOLN
2.0000 g | Freq: Once | INTRAVENOUS | Status: AC
  Administered 2023-01-04: 2 g via INTRAVENOUS
  Filled 2023-01-04: qty 50

## 2023-01-04 NOTE — Discharge Instructions (Addendum)
Follow with Primary MD Julieanne Manson, MD in 7 days   Get CBC, CMP, Ammonia -  checked next visit with your primary MD   Activity: As tolerated with Full fall precautions use walker/cane & assistance as needed  Disposition Home   Diet: Heart Healthy with strict 1.2 L fluid restriction per day  Special Instructions: If you have smoked or chewed Tobacco  in the last 2 yrs please stop smoking, stop any regular Alcohol  and or any Recreational drug use.  On your next visit with your primary care physician please Get Medicines reviewed and adjusted.  Please request your Prim.MD to go over all Hospital Tests and Procedure/Radiological results at the follow up, please get all Hospital records sent to your Prim MD by signing hospital release before you go home.  If you experience worsening of your admission symptoms, develop shortness of breath, life threatening emergency, suicidal or homicidal thoughts you must seek medical attention immediately by calling 911 or calling your MD immediately  if symptoms less severe.  You Must read complete instructions/literature along with all the possible adverse reactions/side effects for all the Medicines you take and that have been prescribed to you. Take any new Medicines after you have completely understood and accpet all the possible adverse reactions/side effects.

## 2023-01-04 NOTE — Discharge Summary (Signed)
Stephen Miranda ZOX:[id number] DOB: 04-Feb-1971 DOA: 12/26/2022  PCP: Stephen Manson, MD  Admit date: 12/26/2022  Discharge date: 01/04/2023  Admitted From: Home   Disposition:  Home   Recommendations for Outpatient Follow-up:   Follow up with PCP in 1-2 weeks  PCP Please obtain BMP/CBC, 2 view CXR in 1week,  (see Discharge instructions)   PCP Please follow up on the following pending results:    Home Health: None   Equipment/Devices: None  Consultations: IR, GI Discharge Condition: Stable    CODE STATUS: Full    Diet Recommendation: Heart Healthy with 1.2 L fluid restriction per day.    Chief Complaint  Patient presents with   Hematemesis     Brief history of present illness from the day of admission and additional interim summary    52 y.o.  male with history of alcoholic liver cirrhosis-presented with hemorrhagic shock in the setting of upper GI bleed with acute blood loss anemia.  Admitted by PCCM to the ICU-required pressors-and numerous units of PRBC.  Evaluated by GI-underwent EGD with banding.  Stabilized and subsequently transferred to [facility] on 6/9.   Significant events: 6/6>> admit to ICU by PCCM-upper GI bleed-with ABLA/hemorrhagic shock.  Intubated for EGD.  EGD with banding. 6/7>> extubated 6/9>> transferred to [facility]   Significant studies: 6/6>> CXR: No acute abnormality 6/9>> CT angio BRTO protocol: Successful prior coil assisted occluded retrograde transvenous obliteration of gastric varices, portal hypertension.  Hepatic cirrhosis. 6/10>> echo: EF 60-65%, normal pulmonary systolic pressure.   Significant microbiology data: None   Procedures: 6/6>> EGD: Large esophageal varices with stigmata of recent bleeding-banding performed-clotted blood in the gastric fundus. 6/6-6/7 ETT 01/03/2023.  TIPS  procedure by IR.                                                                 Hospital Course   Hemorrhagic shock due to upper GI bleeding from variceal bleed Acute blood loss anemia GI bleeding has resolved Hb stable-has required a total of 2 units of PRBC so far Completed octreotide x 72 hours On PPI GI following TIPS procedure done by IR on 01/03/2023 has tolerated well, placed on lactulose.  Eager to go home will be discharged with outpatient follow-up with PCP, IR and GI.   Alcoholic liver cirrhosis Prior GI bleeding-s/p BRTO in 2023 Thrombocytopenia Coagulopathy Completed Rocephin x 5 days for SBP prophylaxis Diuretics/beta-blocker on hold BP soft-but better-on low-dose midodrine Platelet count slowly improving, symptom-free today wants to go home, will be discharged home.   Hypokalemia, Hypomagnesemia  -replaced PCP to monitor   Chronic erythematous/pruritic facial rash - ?  Dermatitis, Claims that PCP is trying to set him up with a dermatologist.  Continue previous supportive care.  9 beat run of NSVT morning of 01/04/2023.  Asymptomatic, EF >  60%, BP too low for beta-blocker, replaced low NML magnesium and gave oral potassium as well.      Discharge diagnosis     Principal Problem:   GIB (gastrointestinal bleeding) Active Problems:   Acute upper GI bleed   Bleeding esophageal varices (HCC)   Endotracheally intubated   ABLA (acute blood loss anemia)    Discharge instructions    Discharge Instructions     Discharge instructions   Complete by: As directed    Follow with Primary MD Stephen Manson, MD in 7 days   Get CBC, CMP, Ammonia -  checked next visit with your primary MD   Activity: As tolerated with Full fall precautions use walker/cane & assistance as needed  Disposition Home   Diet: Heart Healthy with 1.2 L fluid restriction per day.  Special Instructions: If you have smoked or chewed Tobacco  in the last 2 yrs please stop smoking, stop any  regular Alcohol  and or any Recreational drug use.  On your next visit with your primary care physician please Get Medicines reviewed and adjusted.  Please request your Prim.MD to go over all Hospital Tests and Procedure/Radiological results at the follow up, please get all Hospital records sent to your Prim MD by signing hospital release before you go home.  If you experience worsening of your admission symptoms, develop shortness of breath, life threatening emergency, suicidal or homicidal thoughts you must seek medical attention immediately by calling 911 or calling your MD immediately  if symptoms less severe.  You Must read complete instructions/literature along with all the possible adverse reactions/side effects for all the Medicines you take and that have been prescribed to you. Take any new Medicines after you have completely understood and accpet all the possible adverse reactions/side effects.   Increase activity slowly   Complete by: As directed    No wound care   Complete by: As directed        Discharge Medications   Allergies as of 01/04/2023   No Known Allergies      Medication List     TAKE these medications    carvedilol 3.125 MG tablet Commonly known as: COREG TAKE 1 TABLET BY MOUTH IN THE MORNING, and TAKE 2 TABLETS AT BEDTIME   escitalopram 5 MG tablet Commonly known as: LEXAPRO Take 1 tablet (5 mg total) by mouth daily.   ferrous gluconate 324 MG tablet Commonly known as: FERGON Take 324 mg by mouth daily with breakfast.   furosemide 20 MG tablet Commonly known as: LASIX 1 tab by mouth daily in morning with Spironolactone   lactulose 10 GM/15ML solution Commonly known as: CHRONULAC Take 22.5 mLs (15 g total) by mouth 3 (three) times daily. May increase to 30 mL three times a day if you are having less than 3 bowel movements a day.   midodrine 5 MG tablet Commonly known as: PROAMATINE Take 1 tablet (5 mg total) by mouth 2 (two) times daily with a  meal.   multivitamin with minerals Tabs tablet Take 1 tablet by mouth daily.   pantoprazole 40 MG tablet Commonly known as: Protonix Take 1 tablet (40 mg total) by mouth 2 (two) times daily.   spironolactone 50 MG tablet Commonly known as: Aldactone 1 tab by mouth in morning with furosemide   tamsulosin 0.4 MG Caps capsule Commonly known as: FLOMAX Take 1 capsule (0.4 mg total) by mouth daily after supper.         Follow-up Information  Stephen Manson, MD. Schedule an appointment as soon as possible for a visit in 1 week(s).   Specialty: Internal Medicine Contact information: [address] [place] [address] [phone]         [facility]. Schedule an appointment as soon as possible for a visit in 1 week(s).   Specialty: Gastroenterology Contact information: [address] [place] [address] [phone]        Gilmer Mor, DO. Schedule an appointment as soon as possible for a visit in 1 week(s).   Specialties: Interventional Radiology, Radiology Contact information: [address] [place] [address] [phone]                 Major procedures and Radiology Reports - PLEASE review detailed and final reports thoroughly  -     IR Tips  Result Date: 01/03/2023 CLINICAL DATA:  52 year old male with recurrent esophageal variceal bleeding after prior BRTO embolization, and status post upper endoscopy and esophageal varices banding. He presents for tips for secondary prevention of hemorrhage EXAM: ULTRASOUND-GUIDED ACCESS RIGHT INTERNAL JUGULAR VEIN ULTRASOUND-GUIDED PERCUTANEOUS PORTAL VEIN ACCESS TRANSJUGULAR INTRAHEPATIC PORTOSYSTEMIC SHUNT MEDICATIONS: None ANESTHESIA/SEDATION: The anesthesia team was present to provide general endotracheal tube anesthesia and for patient monitoring during the procedure. Intubation was performed in interventional radiology. Left radial arterial line  was performed by the anesthesia team. Interventional neuro radiology nursing staff was also present. CONTRAST:  86 cc contrast FLUOROSCOPY: Radiation Exposure Index (as provided by the fluoroscopic device): 526 mGy Kerma COMPLICATIONS: None PROCEDURE: Informed written consent was obtained from the patient's family after a thorough discussion of the procedural risks, benefits and alternatives. Specific risks discussed with TIPS/variceal embolization included: Bleeding, infection, vascular injury, need for further procedure/surgery, renal injury/renal failure, contrast reaction, non-target embolization, liver dysfunction/failure, hepatic encephalopathy, stroke (~1%), cardiopulmonary collapse, death. All questions were addressed. Maximal Sterile Barrier Technique was utilized including caps, mask, sterile gowns, sterile gloves, sterile drape, hand hygiene and skin antiseptic. A timeout was performed prior to the initiation of the procedure. Patient was positioned supine position on the table, with the right upper quadrant and the right neck prepped and draped in the usual sterile fashion. Ultrasound survey was then performed of the right neck, with images stored and sent to PACs. Ultrasound guidance was then used to access the right internal jugular vein with a micro puncture kit. The wire was advanced under fluoroscopy into the right atrium, and a small incision was made. The needle was removed and the dilator was placed. The micro wire in the stiffener were removed and an 035 wire was then passed into the inferior vena cava. Ten French soft tissue dilation was performed on the wire, and then 10 [place] TIPS sheath was placed. Ultrasound survey of the right upper quadrant was performed, with images stored and sent to PACs. Using ultrasound guidance, Chiba needle was used access the right portal system. Once we confirmed position in the portal system with portal venography, micro wire was advanced into the inferior  mesenteric vein. The inner dilator was advanced over the metal stiffener into the portal system, with the inner dilator advanced over the wire. Venogram confirmed are location within the portal system. The micro wire was then replaced into the inner dilator, with the transition point position at the apex of the catheter. Check flow vial was placed on the back and of the inner dilator. Combination of Benson wire and multipurpose angled catheter were used to select the  right hepatic vein. Venogram confirmed location within the right hepatic vein. Tip sheath was then advanced over the catheter with a coaxial Bentson wire, for a position cephalad to the transition point of the wire in the portal venous system. The Bentson wire was removed and Amplatz wire was placed. Over the Amplatz wire, we advanced Argon Scorpion needle through the sheath and into the selected hepatic vein. The Amplatz wire was removed. Needle was used to engage the tissue, with first attempt performed with the selected needle. Given the density of the liver and the angle of approach, ultimately the scorpion needle prolapse into the IVC. The needle was removed. The angled multipurpose catheter was again used to select the right hepatic vein. Once the catheter was in position a Bentson wire was advanced. Over the Bentson wire and the catheter the sheath was advanced into the right hepatic vein. Bentson wire was removed and Amplatz wire was placed. Given the angle into the targeted portal vein, we elected then to use the standard colapinto needle. The Colapinto needle was then advanced through the TIPS sheath housed in the Teflon sheath over the Amplatz wire. Using frontal and oblique projections, 2 passes were required to access the portal venous system. Access into the portal venous system was confirmed by aspirating portal venous blood and a venogram. Teena Dunk wire was then passed through the Colapinto needle into the splenic vein. Needle was removed  and disposed. Pigtail marking catheter would not advance on the Bentson wire. Pigtail was removed. 90 cm never cross crossing catheter was advanced on the Bentson wire into the portal vein. Bentson wire was removed. Injection confirmed location. Amplatz wire was then placed. 4 mm balloon dilation along the soft tissue tract was then required. Pigtail catheter was advanced. Simultaneous venogram of the portal system and the hepatic venous system was performed both in a frontal projection and a right anterior oblique projection. Pressure measurement of the portal vein and the right atrium performed. Amplatz was then replaced into the portal vein. Sheath would not advance into position within the portal vein without balloon dilation. 6 mm balloon angioplasty was then performed of the soft tissue tract. Sheath was advanced over the deflating balloon into the portal venous system. Once the sheath was in position in the portal vein, we deployed the selected 2 cm x 10 mm x 80 mm via Bon stent. Once deployed 8 mm balloon dilation was performed. Sheath was advanced into the portal vein and repeat pressure demonstrated portal pressure of 22 which was not significantly changed. We then performed 10 mm balloon dilation along the stent for full expansion. Pigtail catheter was advanced and a final pressure measurement was recorded 8 mm mercury. At this time a completion angiogram was performed and we elected to withdraw. Sheath was removed at the right IJ and the access site of the liver was removed. Patient tolerated the procedure well and remained hemodynamically stable throughout. No significant blood loss and no complications. FINDINGS: Right atrium pressure: 8 mm mercury Pre treatment portal vein measurement: 22 mm mercury Post treatment portal vein measurement: 16 mm mercury Final gradient: 8 mm mercury IMPRESSION: Status post ultrasound guided access right internal jugular vein and percutaneous portal vein access for TIPS  Signed, Yvone Neu. Miachel Roux, RPVI [facility] Rad[facility] Electronically Signed   By: Gilmer Mor D.O.   On: 01/03/2023 14:38   IR US Guide Vasc Access Right  Result Date: 01/03/2023 CLINICAL DATA:  52 year old male  with recurrent esophageal variceal bleeding after prior BRTO embolization, and status post upper endoscopy and esophageal varices banding. He presents for tips for secondary prevention of hemorrhage EXAM: ULTRASOUND-GUIDED ACCESS RIGHT INTERNAL JUGULAR VEIN ULTRASOUND-GUIDED PERCUTANEOUS PORTAL VEIN ACCESS TRANSJUGULAR INTRAHEPATIC PORTOSYSTEMIC SHUNT MEDICATIONS: None ANESTHESIA/SEDATION: The anesthesia team was present to provide general endotracheal tube anesthesia and for patient monitoring during the procedure. Intubation was performed in interventional radiology. Left radial arterial line was performed by the anesthesia team. Interventional neuro radiology nursing staff was also present. CONTRAST:  86 cc contrast FLUOROSCOPY: Radiation Exposure Index (as provided by the fluoroscopic device): 526 mGy Kerma COMPLICATIONS: None PROCEDURE: Informed written consent was obtained from the patient's family after a thorough discussion of the procedural risks, benefits and alternatives. Specific risks discussed with TIPS/variceal embolization included: Bleeding, infection, vascular injury, need for further procedure/surgery, renal injury/renal failure, contrast reaction, non-target embolization, liver dysfunction/failure, hepatic encephalopathy, stroke (~1%), cardiopulmonary collapse, death. All questions were addressed. Maximal Sterile Barrier Technique was utilized including caps, mask, sterile gowns, sterile gloves, sterile drape, hand hygiene and skin antiseptic. A timeout was performed prior to the initiation of the procedure. Patient was positioned supine position on the table, with the right upper quadrant and the right neck prepped and draped  in the usual sterile fashion. Ultrasound survey was then performed of the right neck, with images stored and sent to PACs. Ultrasound guidance was then used to access the right internal jugular vein with a micro puncture kit. The wire was advanced under fluoroscopy into the right atrium, and a small incision was made. The needle was removed and the dilator was placed. The micro wire in the stiffener were removed and an 035 wire was then passed into the inferior vena cava. Ten French soft tissue dilation was performed on the wire, and then 10 [place] TIPS sheath was placed. Ultrasound survey of the right upper quadrant was performed, with images stored and sent to PACs. Using ultrasound guidance, Chiba needle was used access the right portal system. Once we confirmed position in the portal system with portal venography, micro wire was advanced into the inferior mesenteric vein. The inner dilator was advanced over the metal stiffener into the portal system, with the inner dilator advanced over the wire. Venogram confirmed are location within the portal system. The micro wire was then replaced into the inner dilator, with the transition point position at the apex of the catheter. Check flow vial was placed on the back and of the inner dilator. Combination of Benson wire and multipurpose angled catheter were used to select the right hepatic vein. Venogram confirmed location within the right hepatic vein. Tip sheath was then advanced over the catheter with a coaxial Bentson wire, for a position cephalad to the transition point of the wire in the portal venous system. The Bentson wire was removed and Amplatz wire was placed. Over the Amplatz wire, we advanced Argon Scorpion needle through the sheath and into the selected hepatic vein. The Amplatz wire was removed. Needle was used to engage the tissue, with first attempt performed with the selected needle. Given the density of the liver and the angle of approach, ultimately  the scorpion needle prolapse into the IVC. The needle was removed. The angled multipurpose catheter was again used to select the right hepatic vein. Once the catheter was in position a Bentson wire was advanced. Over the Bentson wire and the catheter the sheath was advanced into the right hepatic vein. Bentson wire was removed and Amplatz wire  was placed. Given the angle into the targeted portal vein, we elected then to use the standard colapinto needle. The Colapinto needle was then advanced through the TIPS sheath housed in the Teflon sheath over the Amplatz wire. Using frontal and oblique projections, 2 passes were required to access the portal venous system. Access into the portal venous system was confirmed by aspirating portal venous blood and a venogram. Teena Dunk wire was then passed through the Colapinto needle into the splenic vein. Needle was removed and disposed. Pigtail marking catheter would not advance on the Bentson wire. Pigtail was removed. 90 cm never cross crossing catheter was advanced on the Bentson wire into the portal vein. Bentson wire was removed. Injection confirmed location. Amplatz wire was then placed. 4 mm balloon dilation along the soft tissue tract was then required. Pigtail catheter was advanced. Simultaneous venogram of the portal system and the hepatic venous system was performed both in a frontal projection and a right anterior oblique projection. Pressure measurement of the portal vein and the right atrium performed. Amplatz was then replaced into the portal vein. Sheath would not advance into position within the portal vein without balloon dilation. 6 mm balloon angioplasty was then performed of the soft tissue tract. Sheath was advanced over the deflating balloon into the portal venous system. Once the sheath was in position in the portal vein, we deployed the selected 2 cm x 10 mm x 80 mm via Bon stent. Once deployed 8 mm balloon dilation was performed. Sheath was advanced into  the portal vein and repeat pressure demonstrated portal pressure of 22 which was not significantly changed. We then performed 10 mm balloon dilation along the stent for full expansion. Pigtail catheter was advanced and a final pressure measurement was recorded 8 mm mercury. At this time a completion angiogram was performed and we elected to withdraw. Sheath was removed at the right IJ and the access site of the liver was removed. Patient tolerated the procedure well and remained hemodynamically stable throughout. No significant blood loss and no complications. FINDINGS: Right atrium pressure: 8 mm mercury Pre treatment portal vein measurement: 22 mm mercury Post treatment portal vein measurement: 16 mm mercury Final gradient: 8 mm mercury IMPRESSION: Status post ultrasound guided access right internal jugular vein and percutaneous portal vein access for TIPS Signed, Yvone Neu. Miachel Roux, RPVI [facility] Radiology Electronically Signed   By: Gilmer Mor D.O.   On: 01/03/2023 14:38   ECHOCARDIOGRAM COMPLETE  Result Date: 12/30/2022    ECHOCARDIOGRAM REPORT   Patient Name:   Stephen Miranda  Date of Exam: 12/30/2022 Medical Rec #:  [id number]  Height:       74.0 in Accession #:    [id number] Weight:       192.2 lb Date of Birth:  [birth year]   BSA:          2.137 m Patient Age:    52 years   BP:           94/60 mmHg Patient Gender: M          HR:           73 bpm. Exam Location:  Inpatient Procedure: 2D Echo, 3D Echo, Cardiac Doppler and Color Doppler Indications:    Z01.818 Encounter for other preprocedural examination  History:        Patient has no prior history of Echocardiogram examinations.  Risk Factors:Hypertension. ETOH. Shock.  Sonographer:    Sheralyn Boatman RDCS Referring Phys: [id number] Werner Lean [facility] IMPRESSIONS  1. Left ventricular ejection fraction, by estimation, is 60 to 65%. Left ventricular ejection fraction by 3D volume is 61 %. The left  ventricle has normal function. The left ventricle has no regional wall motion abnormalities. Left ventricular diastolic  parameters were normal.  2. Right ventricular systolic function is normal. The right ventricular size is normal. There is normal pulmonary artery systolic pressure.  3. Left atrial size was mildly dilated.  4. The mitral valve is normal in structure. Mild mitral valve regurgitation.  5. The aortic valve is tricuspid. Aortic valve regurgitation is not visualized. No aortic stenosis is present.  6. Aortic dilatation noted. There is mild dilatation of the aortic root, measuring 43 mm.  7. The inferior vena cava is normal in size with greater than 50% respiratory variability, suggesting right atrial pressure of 3 mmHg. Comparison(s): No prior Echocardiogram. FINDINGS  Left Ventricle: There is a prominent false tendon present. Left ventricular ejection fraction, by estimation, is 60 to 65%. Left ventricular ejection fraction by 3D volume is 61 %. The left ventricle has normal function. The left ventricle has no regional wall motion abnormalities. The left ventricular internal cavity size was normal in size. There is no left ventricular hypertrophy. Left ventricular diastolic parameters were normal. Right Ventricle: The right ventricular size is normal. No increase in right ventricular wall thickness. Right ventricular systolic function is normal. There is normal pulmonary artery systolic pressure. The tricuspid regurgitant velocity is 1.75 m/s, and  with an assumed right atrial pressure of 3 mmHg, the estimated right ventricular systolic pressure is 15.2 mmHg. Left Atrium: Left atrial size was mildly dilated. Right Atrium: Right atrial size was normal in size. Pericardium: There is no evidence of pericardial effusion. Mitral Valve: The mitral valve is normal in structure. Mild mitral valve regurgitation. Tricuspid Valve: The tricuspid valve is normal in structure. Tricuspid valve regurgitation is  trivial. Aortic Valve: The aortic valve is tricuspid. Aortic valve regurgitation is not visualized. No aortic stenosis is present. Pulmonic Valve: The pulmonic valve was normal in structure. Pulmonic valve regurgitation is trivial. Aorta: Aortic dilatation noted. There is mild dilatation of the aortic root, measuring 43 mm. Venous: The inferior vena cava is normal in size with greater than 50% respiratory variability, suggesting right atrial pressure of 3 mmHg. IAS/Shunts: The atrial septum is grossly normal.  LEFT VENTRICLE PLAX 2D LVIDd:         5.35 cm         Diastology LVIDs:         3.50 cm         LV e' medial:    11.00 cm/s LV PW:         1.45 cm         LV E/e' medial:  6.8 LV IVS:        1.00 cm         LV e' lateral:   15.20 cm/s LVOT diam:     2.60 cm         LV E/e' lateral: 4.9 LV SV:         131 LV SV Index:   61 LVOT Area:     5.31 cm        3D Volume EF  LV 3D EF:    Left                                             ventricul                                             ar                                             ejection                                             fraction                                             by 3D                                             volume is                                             61 %.                                 3D Volume EF:                                3D EF:        61 %                                LV EDV:       216 ml                                LV ESV:       84 ml                                LV SV:        132 ml RIGHT VENTRICLE             IVC RV S prime:     14.70 cm/s  IVC diam: 1.70 cm TAPSE (M-mode): 2.2 cm LEFT ATRIUM             Index        RIGHT ATRIUM           Index LA diam:  4.30 cm 2.01 cm/m   RA Area:     15.70 cm LA Vol (A2C):   81.9 ml 38.32 ml/m  RA Volume:   39.10 ml  18.29 ml/m LA Vol (A4C):   56.0 ml 26.20 ml/m LA Biplane Vol: 67.8 ml 31.72 ml/m  AORTIC VALVE LVOT Vmax:   114.00  cm/s LVOT Vmean:  77.600 cm/s LVOT VTI:    0.247 m  AORTA Ao Root diam: 4.30 cm Ao Asc diam:  3.40 cm MITRAL VALVE               TRICUSPID VALVE MV Area (PHT): 3.48 cm    TR Peak grad:   12.2 mmHg MV Decel Time: 218 msec    TR Vmax:        175.00 cm/s MV E velocity: 74.50 cm/s MV A velocity: 56.05 cm/s  SHUNTS MV E/A ratio:  1.33        Systemic VTI:  0.25 m                            Systemic Diam: 2.60 cm Laurance Flatten MD Electronically signed by Laurance Flatten MD Signature Date/Time: 12/30/2022/12:19:43 PM    Final    CT ANGIO ABD/PELVIS BRTO  Result Date: 12/30/2022 CLINICAL DATA:  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 Patient underwent BRTO procedure in December of 2023. EXAM: CTA ABDOMEN AND PELVIS WITHOUT AND WITH CONTRAST TECHNIQUE: Multidetector CT imaging of the abdomen and pelvis was performed using the standard protocol during bolus administration of intravenous contrast. Multiplanar reconstructed images and MIPs were obtained and reviewed to evaluate the vascular anatomy. RADIATION DOSE REDUCTION: This exam was performed according to the departmental dose-optimization program which includes automated exposure control, adjustment of the mA and/or kV according to patient size and/or use of iterative reconstruction technique. CONTRAST:  OMNIPAQUE IOHEXOL 350 MG/ML SOLN COMPARISON:  Most recent prior CT scan of the abdomen and pelvis 07/05/2022 and 07/10/2022 FINDINGS: VASCULAR Aorta: Normal caliber aorta without aneurysm, dissection, vasculitis or significant stenosis. Trace atherosclerotic vascular calcifications. Celiac: Patent without evidence of aneurysm, dissection, vasculitis or significant stenosis. SMA: Patent without evidence of aneurysm, dissection, vasculitis or significant stenosis. Renals: Both renal arteries are patent without evidence of aneurysm, dissection, vasculitis, fibromuscular dysplasia or significant stenosis. IMA: Patent without  evidence of aneurysm, dissection, vasculitis or significant stenosis. Inflow: Patent without evidence of aneurysm, dissection, vasculitis or significant stenosis. Proximal Outflow: Bilateral common femoral and visualized portions of the superficial and profunda femoral arteries are patent without evidence of aneurysm, dissection, vasculitis or significant stenosis. Veins: Coil embolization of gastro renal shunt. Interval obliteration of gastric varices. No residual varices seen in the upper abdomen. The portal vein is patent. Recanalized periumbilical vein. Small esophageal varices are present. Splenic vein not well evaluated due to streak artifact from the coil pack in the prior gastro renal shunt, however it appears patent. The SMV is widely patent. Retroaortic left renal vein noted incidentally. No evidence of DVT in the femoral or iliac veins. Review of the MIP images confirms the above findings. NON-VASCULAR Lower chest: Trace bilateral pleural effusions. Bilateral lower lobe atelectasis. The heart is normal in size. No pericardial effusion. Calcifications present along the coronary arteries. Hepatobiliary: Cirrhotic liver morphology. No evidence of a arterially enhancing lesion to suggest hepatocellular carcinoma. Multiple circumscribed low-attenuation lesions are again noted scattered throughout the liver consistent with simple cysts. Small stones are present within the gallbladder lumen.  No biliary ductal dilatation. Pancreas: Unremarkable. No pancreatic ductal dilatation or surrounding inflammatory changes. Spleen: Splenomegaly. Adrenals/Urinary Tract: Normal adrenal glands. No hydronephrosis, nephrolithiasis or enhancing renal mass. Multiple circumscribed low-attenuation renal lesions are again seen without significant interval change. Findings are consistent with simple cysts. No imaging follow-up is recommended. Unremarkable ureters. The bladder is distended with urine. Stomach/Bowel: Diffuse submucosal  edema present in the cecum, terminal ileum and ascending colon. 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 No evidence of bowel obstruction. Lymphatic: No suspicious. Reproductive: Prostate is unremarkable. Other: Moderate ascites.  Diffuse mesenteric edema. Musculoskeletal: No acute fracture or aggressive appearing lytic or blastic osseous lesion. IMPRESSION: VASCULAR 1. Successful prior coil assisted balloon occluded retrograde transvenous obliteration of gastric varices. Gastric varices are completely resolved and the prior gastro renal shunt remains occluded. 2. Portal hypertension with small esophageal varices as expected. No new or clinically significant portosystemic collaterals. 3. No evidence of portal or visceral venous thrombosis. 4. Coronary artery and aortic atherosclerotic vascular calcifications. No acute arterial pathology. NON-VASCULAR 1. Hepatic cirrhosis with portal hypertension and moderate ascites. 2. 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 In the appropriate clinical setting, infectious/inflammatory colitis could appear similar. 3. Trace bilateral pleural effusions and associated lower lobe atelectasis. 4. Additional ancillary findings as above. Electronically Signed   By: Malachy Moan M.D.   On: 12/30/2022 08:11   [place] EKG SITE RITE  Result Date: 12/27/2022 If Site Rite image not attached, placement could not be confirmed due to current cardiac rhythm.  DG CHEST PORT 1 VIEW  Result Date: 12/27/2022 CLINICAL DATA:  Status post EGD EXAM: PORTABLE CHEST 1 VIEW COMPARISON:  Film from earlier in the same day. FINDINGS: Endotracheal tube is noted in satisfactory position. Cardiac shadow is stable. Changes of prior embolization in the left upper abdomen are seen. The lungs are well aerated bilaterally. No focal infiltrate or effusion is noted.  IMPRESSION: No acute abnormality noted. Electronically Signed   By: Alcide Clever M.D.   On: 12/27/2022 00:53   DG CHEST PORT 1 VIEW  Result Date: 12/26/2022 CLINICAL DATA:  Check endotracheal tube placement EXAM: PORTABLE CHEST 1 VIEW COMPARISON:  07/03/2022 FINDINGS: Endotracheal tube is noted 6 cm above the carina. Cardiac shadow is within normal limits. Changes of prior embolotherapy are seen in the left upper abdomen. No focal infiltrate is seen. Bony structures are within normal limits. IMPRESSION: Endotracheal tube as described. No acute abnormality noted. Electronically Signed   By: Alcide Clever M.D.   On: 12/26/2022 22:13    Micro Results    Recent Results (from the past 240 hour(s))  MRSA Next Gen by PCR, Nasal     Status: None   Collection Time: 12/27/22  9:16 AM   Specimen: Nasal Mucosa; Nasal Swab  Result Value Ref Range Status   MRSA by PCR Next Gen NOT DETECTED NOT DETECTED Final    Comment: (NOTE) The GeneXpert MRSA Assay (FDA approved for NASAL specimens only), is one component of a comprehensive MRSA colonization surveillance program. It is not intended to diagnose MRSA infection nor to guide or monitor treatment for MRSA infections. Test performance is not FDA approved in patients less than 66 years old. Performed at [facility], [address] [place], [place] [address]     Today   Subjective    Stephen Miranda today has no headache,no chest abdominal  pain,no new weakness tingling or numbness, feels much better wants to go home today.    Objective   Blood pressure (!) 105/53, pulse 93, temperature 98 F (36.7 C), temperature source Oral, resp. rate 20, height 6\' 2"  (1.88 m), weight 127.8 kg, SpO2 93 %.   Intake/Output Summary (Last 24 hours) at 01/04/2023 0848 Last data filed at 01/04/2023 0300 Gross per 24 hour  Intake 1900 ml  Output 3875 ml  Net -1975 ml    Exam  Awake Alert, No new F.N deficits,    .AT,PERRAL Supple Neck,   Symmetrical Chest  wall movement, Good air movement bilaterally, CTAB RRR,No Gallops,   +ve B.Sounds, Abd Soft, Non tender,  No Cyanosis, Clubbing or edema    Data Review   Recent Labs  Lab 12/30/22 0436 12/31/22 0420 01/01/23 0349 01/03/23 0438 01/04/23 0336  WBC 4.2 3.4* 4.0 4.6 9.0  HGB 7.7* 7.7* 7.7* 7.9* 9.1*  HCT 22.7* 23.7* 23.4* 24.6* 28.1*  PLT 58* 64* 82* 107* 162  MCV 92.7 93.7 93.2 96.5 95.6  MCH 31.4 30.4 30.7 31.0 31.0  MCHC 33.9 32.5 32.9 32.1 32.4  RDW 16.1* 15.9* 15.8* 15.5 15.2    Recent Labs  Lab 12/30/22 0436 12/31/22 0420 01/01/23 0349 01/03/23 0438  NA 134* 137 136 137  K 3.4* 3.5 3.5 3.7  CL 105 105 106 106  CO2 22 23 23 23   ANIONGAP 7 9 7 8   GLUCOSE 94 89 91 89  BUN 9 5* <5* <5*  CREATININE 0.78 0.78 0.78 0.77  AST  --  21  --  25  ALT  --  15  --  13  ALKPHOS  --  38  --  50  BILITOT  --  0.8  --  0.9  ALBUMIN  --  2.2*  --  2.4*  INR  --   --   --  1.4*  MG  --  1.5* 1.7  --   CALCIUM 7.7* 8.0* 7.8* 8.1*    Total Time in preparing paper work, data evaluation and todays exam - 35 minutes  Signature  -    Susa Raring M.D on 01/04/2023 at 8:48 AM   -  To page go to [email or web address]

## 2023-01-04 NOTE — Plan of Care (Signed)
  Problem: Education: Goal: Ability to describe self-care measures that may prevent or decrease complications (Diabetes Survival Skills Education) will improve Outcome: Adequate for Discharge Goal: Individualized Educational Video(s) Outcome: Adequate for Discharge   Problem: Coping: Goal: Ability to adjust to condition or change in health will improve Outcome: Adequate for Discharge   Problem: Fluid Volume: Goal: Ability to maintain a balanced intake and output will improve Outcome: Adequate for Discharge   Problem: Health Behavior/Discharge Planning: Goal: Ability to identify and utilize available resources and services will improve Outcome: Adequate for Discharge Goal: Ability to manage health-related needs will improve Outcome: Adequate for Discharge   Problem: Metabolic: Goal: Ability to maintain appropriate glucose levels will improve Outcome: Adequate for Discharge   Problem: Nutritional: Goal: Maintenance of adequate nutrition will improve Outcome: Adequate for Discharge Goal: Progress toward achieving an optimal weight will improve Outcome: Adequate for Discharge   Problem: Skin Integrity: Goal: Risk for impaired skin integrity will decrease Outcome: Adequate for Discharge   Problem: Tissue Perfusion: Goal: Adequacy of tissue perfusion will improve Outcome: Adequate for Discharge   Problem: Health Behavior/Discharge Planning: Goal: Ability to manage health-related needs will improve Outcome: Adequate for Discharge   Problem: Clinical Measurements: Goal: Ability to maintain clinical measurements within normal limits will improve Outcome: Adequate for Discharge Goal: Will remain free from infection Outcome: Adequate for Discharge Goal: Diagnostic test results will improve Outcome: Adequate for Discharge Goal: Respiratory complications will improve Outcome: Adequate for Discharge Goal: Cardiovascular complication will be avoided Outcome: Adequate for  Discharge   Problem: Activity: Goal: Risk for activity intolerance will decrease Outcome: Adequate for Discharge   Problem: Nutrition: Goal: Adequate nutrition will be maintained Outcome: Adequate for Discharge   Problem: Coping: Goal: Level of anxiety will decrease Outcome: Adequate for Discharge   Problem: Elimination: Goal: Will not experience complications related to bowel motility Outcome: Adequate for Discharge Goal: Will not experience complications related to urinary retention Outcome: Adequate for Discharge   Problem: Pain Managment: Goal: General experience of comfort will improve Outcome: Adequate for Discharge   Problem: Safety: Goal: Ability to remain free from injury will improve Outcome: Adequate for Discharge   Problem: Skin Integrity: Goal: Risk for impaired skin integrity will decrease Outcome: Adequate for Discharge   Problem: Education: Goal: Ability to identify signs and symptoms of gastrointestinal bleeding will improve Outcome: Adequate for Discharge   Problem: Bowel/Gastric: Goal: Will show no signs and symptoms of gastrointestinal bleeding Outcome: Adequate for Discharge   Problem: Fluid Volume: Goal: Will show no signs and symptoms of excessive bleeding Outcome: Adequate for Discharge   Problem: Clinical Measurements: Goal: Complications related to the disease process, condition or treatment will be avoided or minimized Outcome: Adequate for Discharge

## 2023-01-04 NOTE — Progress Notes (Signed)
RUE PICC removal: site unremarkable. Vaseline gauze and gauze pressure dressing applied. Pt instructed to remain flat in bed for :30. Keep dressing in place for 24 hours.

## 2023-01-04 NOTE — Progress Notes (Signed)
AVS reviewed with pt by Juliette Alcide, RN. Pt's home medications retrieved from pharmacy and given to pt. Pt left unit in NAD, and will leave via private vehicle with his mother.

## 2023-01-06 ENCOUNTER — Telehealth: Payer: Self-pay

## 2023-01-06 NOTE — Telephone Encounter (Signed)
Patient has been scheduled

## 2023-01-06 NOTE — Telephone Encounter (Signed)
Patient called for an After ED appointment . Patient has been scheduled for June 28, but patient stated he needs to be seen sooner.   Will call him if there is a cancellation.

## 2023-01-07 ENCOUNTER — Ambulatory Visit: Payer: Medicaid Other | Admitting: Internal Medicine

## 2023-01-07 ENCOUNTER — Telehealth (HOSPITAL_COMMUNITY): Payer: Self-pay | Admitting: Student

## 2023-01-07 ENCOUNTER — Encounter: Payer: Self-pay | Admitting: Internal Medicine

## 2023-01-07 VITALS — BP 112/58 | HR 75 | Resp 16 | Ht 74.0 in | Wt 193.5 lb

## 2023-01-07 DIAGNOSIS — K7031 Alcoholic cirrhosis of liver with ascites: Secondary | ICD-10-CM

## 2023-01-07 DIAGNOSIS — N138 Other obstructive and reflux uropathy: Secondary | ICD-10-CM | POA: Diagnosis not present

## 2023-01-07 DIAGNOSIS — N401 Enlarged prostate with lower urinary tract symptoms: Secondary | ICD-10-CM

## 2023-01-07 DIAGNOSIS — D62 Acute posthemorrhagic anemia: Secondary | ICD-10-CM | POA: Diagnosis not present

## 2023-01-07 DIAGNOSIS — L989 Disorder of the skin and subcutaneous tissue, unspecified: Secondary | ICD-10-CM

## 2023-01-07 DIAGNOSIS — Z23 Encounter for immunization: Secondary | ICD-10-CM | POA: Diagnosis not present

## 2023-01-07 LAB — POCT URINALYSIS DIPSTICK
Bilirubin, UA: NEGATIVE
Blood, UA: NEGATIVE
Glucose, UA: NEGATIVE
Ketones, UA: NEGATIVE
Leukocytes, UA: NEGATIVE
Nitrite, UA: NEGATIVE
Protein, UA: NEGATIVE
Spec Grav, UA: 1.01 (ref 1.010–1.025)
Urobilinogen, UA: 0.2 E.U./dL
pH, UA: 6.5 (ref 5.0–8.0)

## 2023-01-07 NOTE — Progress Notes (Signed)
Subjective:    Patient ID: Stephen Miranda, male   DOB: 07/01/1971, 52 y.o.   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  He underwent banding of the varices 12/26/2022 and then TIPS performed 01/03/2023.   Feels well No light headedness or fatigue.  No orthostasis.   No melena or hematochezia Feels his thinking is clear.   He is taking the Lactulose 22.5 ml 3 times daily, but only having one formed stool.  He was not aware he should increase his dose with only one stool daily.   He is not taking his beta blockade any longer. He did not have refractory ascites to diuretics and continues to not feel abdominal pressure or have weight gain.  His weights do not appear accurate from hospital bed scale as almost 100 lbs above usual.   He has not gained weight on his scale since home. Has not noted edema of legs. He is on Midodrine now     2.  Depression:  did not restart Escitalopram after hospitalization.  Only took for 3 weeks prior as was delayed in picking up after visit end of April.  Does not feel depressed.     3.  BPH:  he has not restarted tamsulosin since discharge.  He is getting some bladder pressure.  His flow is decreased, but still with good urine flow.  No burning on urination. He did have a urine catheter places at one point during the hospitalization.  Current Meds  Medication Sig   lactulose (CHRONULAC) 10 GM/15ML solution Take 22.5 mLs (15 g total) by mouth 3 (three) times daily. May increase to 30 mL three times a day if you are having less than 3 bowel movements a day.   midodrine (PROAMATINE) 5 MG tablet Take 1 tablet (5 mg total) by mouth 2 (two) times daily with a meal.   pantoprazole (PROTONIX) 40 MG tablet Take 1 tablet (40 mg total) by mouth 2 (two) times daily.   tamsulosin (FLOMAX) 0.4 MG CAPS capsule Take 1 capsule (0.4 mg total) by mouth  daily after supper.   No Known Allergies   Review of Systems    Objective:   BP (!) 112/58 (BP Location: Right Arm, Patient Position: Sitting, Cuff Size: Normal)   Pulse 75   Resp 16   Ht 6\' 2"  (1.88 m)   Wt 193 lb 8 oz (87.8 kg)   BMI 24.84 kg/m   Physical Exam Pale, but has more energy than usual Thinking is clear.  Speech appropriate HEENT:  PERRL, EOMI Neck;  Supple, No adenopathy.  Clean and dry dressing at right neck base. Lungs:  CTA CV:  RRR without murmur or rub.  Radial and DP pulses normal and equal Abd:  S, doughy,  No HSM or mass.  + BS.  No fluid wave. NT LE:  Mild to moderated pitting edema to high pretib area.    Mild to moderate pitting edema to high ant tib area. Assessment & Plan   UGI bleed from esophageal varices, S/P banding of varices followed by TIPS procedure.  Appears to be doing well.   Checking orthostatics and blood work before considering wean of Midodrine.  Would also like him back on Tamsulosin for a few days before considering wean.   With his peripheral edema, again, would like to see blood work first before considering restart  of diuretics. He states he is weighing daily and will call if notes increase. He states he is following 2g sodium diet and staying at or below 1.5 L of fluids daily. CBC, CMP, Ammonia level.  No plans to restart beta blocker unless GI prefers.  Dr Barron Alvine  2.  Anemia:  CBC  3.  BPH:  restart Tamsulosin.  Check UA also with history of catheter placement during hospital stay.  UA normal.    4.  Facial lesions:  sounds like developed some sort of rash on face during his hospital stay.  Gone now.  He does have inflamed lesions scattered on face I have been concerned for precancerous to cancerous lesions.  He did not respond to previous referral to [facility], so will refer again.  5.  Recent TIPS placement:  ammonia level.    6.  HM:  Prevnar 20 given.

## 2023-01-07 NOTE — Telephone Encounter (Signed)
TIPS follow up phone call attempted. Voice message left on patient's cell phone. Return call requested.  Alwyn Ren, [place] [phone] 01/07/2023, 12:55 PM

## 2023-01-08 LAB — COMPREHENSIVE METABOLIC PANEL
ALT: 30 IU/L (ref 0–44)
AST: 44 IU/L — ABNORMAL HIGH (ref 0–40)
Albumin: 3.3 g/dL — ABNORMAL LOW (ref 3.8–4.9)
Alkaline Phosphatase: 84 IU/L (ref 44–121)
BUN/Creatinine Ratio: 7 — ABNORMAL LOW (ref 9–20)
BUN: 5 mg/dL — ABNORMAL LOW (ref 6–24)
Bilirubin Total: 1.1 mg/dL (ref 0.0–1.2)
CO2: 23 mmol/L (ref 20–29)
Calcium: 8.7 mg/dL (ref 8.7–10.2)
Chloride: 106 mmol/L (ref 96–106)
Creatinine, Ser: 0.68 mg/dL — ABNORMAL LOW (ref 0.76–1.27)
Globulin, Total: 2.3 g/dL (ref 1.5–4.5)
Glucose: 83 mg/dL (ref 70–99)
Potassium: 4 mmol/L (ref 3.5–5.2)
Sodium: 141 mmol/L (ref 134–144)
Total Protein: 5.6 g/dL — ABNORMAL LOW (ref 6.0–8.5)
eGFR: 113 mL/min/{1.73_m2} (ref >59)

## 2023-01-08 LAB — CBC WITH DIFFERENTIAL/PLATELET
Basophils Absolute: 0.1 10*3/uL (ref 0.0–0.2)
Basos: 1 %
EOS (ABSOLUTE): 0.1 10*3/uL (ref 0.0–0.4)
Eos: 2 %
Hematocrit: 27.1 % — ABNORMAL LOW (ref 37.5–51.0)
Hemoglobin: 8.9 g/dL — ABNORMAL LOW (ref 13.0–17.7)
Immature Grans (Abs): 0 10*3/uL (ref 0.0–0.1)
Immature Granulocytes: 0 %
Lymphocytes Absolute: 1.4 10*3/uL (ref 0.7–3.1)
Lymphs: 19 %
MCH: 29.7 pg (ref 26.6–33.0)
MCHC: 32.8 g/dL (ref 31.5–35.7)
MCV: 90 fL (ref 79–97)
Monocytes Absolute: 0.6 10*3/uL (ref 0.1–0.9)
Monocytes: 8 %
Neutrophils Absolute: 5.1 10*3/uL (ref 1.4–7.0)
Neutrophils: 70 %
Platelets: 153 10*3/uL (ref 150–450)
RBC: 3 x10E6/uL — ABNORMAL LOW (ref 4.14–5.80)
RDW: 13.7 % (ref 11.6–15.4)
WBC: 7.3 10*3/uL (ref 3.4–10.8)

## 2023-01-08 LAB — AMMONIA: Ammonia: 45 ug/dL (ref 40–200)

## 2023-01-14 ENCOUNTER — Telehealth (HOSPITAL_COMMUNITY): Payer: Self-pay | Admitting: Student

## 2023-01-14 NOTE — Telephone Encounter (Signed)
TIPS follow up phone call attempted today. I called the patient's cell number and there was no answer. Unable to leave a message due to mail box being full.   Alwyn Ren, [place] [phone] 01/14/2023, 1:20 PM

## 2023-01-17 ENCOUNTER — Ambulatory Visit: Payer: Medicaid Other | Admitting: Internal Medicine

## 2023-01-20 ENCOUNTER — Ambulatory Visit (INDEPENDENT_AMBULATORY_CARE_PROVIDER_SITE_OTHER): Payer: Medicaid Other | Admitting: Internal Medicine

## 2023-01-20 DIAGNOSIS — K7031 Alcoholic cirrhosis of liver with ascites: Secondary | ICD-10-CM

## 2023-01-20 DIAGNOSIS — D62 Acute posthemorrhagic anemia: Secondary | ICD-10-CM

## 2023-01-20 NOTE — Progress Notes (Signed)
Follow up on Midodrine:  currently taking 5 mg in morning and half at night. He is checking BPs at home.  Generally 110/70.   Once daily, notes slight light headedness once daily--generally only if late to eat.    To cut his midodrine to 2.5 mg twice daily or half tab each dose.   After 2 days, if bp and pulse remains stable, can decrease to 1/2 tab in morning. AFter 2 more days, if bp and pulse remain stable, can stop. To call progress report on Friday morning, July 5th

## 2023-02-03 ENCOUNTER — Ambulatory Visit: Payer: Medicaid Other | Admitting: Internal Medicine

## 2023-02-04 ENCOUNTER — Encounter: Payer: Self-pay | Admitting: Internal Medicine

## 2023-02-04 ENCOUNTER — Ambulatory Visit: Payer: Medicaid Other | Admitting: Internal Medicine

## 2023-02-04 VITALS — BP 120/60 | HR 70 | Resp 16 | Ht 74.0 in | Wt 185.0 lb

## 2023-02-04 DIAGNOSIS — K7031 Alcoholic cirrhosis of liver with ascites: Secondary | ICD-10-CM | POA: Diagnosis not present

## 2023-02-04 DIAGNOSIS — D62 Acute posthemorrhagic anemia: Secondary | ICD-10-CM

## 2023-02-04 MED ORDER — LACTULOSE 20 GM/30ML PO SOLN: ORAL | 4 refills | Status: DC

## 2023-02-04 MED ORDER — CARVEDILOL 3.125 MG PO TABS: ORAL_TABLET | ORAL | 11 refills | Status: DC

## 2023-02-04 NOTE — Progress Notes (Signed)
    Subjective:    Patient ID: Stephen Miranda, male   DOB: [birth year], 52 y.o.   MRN: [id number]   HPI   Cirrhosis with esophageal varices, status post TIPS following another UGI bleed.   Weaned off midodrine--last dose a week ago.  No light headedness/presyncope.  He is not taking Carvedilol, which I was not aware of last visit.   He has been back on Tamsulosin since seen beginning of month.  Urinating fine and again, no presyncope or syncope.   Current Meds  Medication Sig   escitalopram (LEXAPRO) 5 MG tablet Take 1 tablet (5 mg total) by mouth daily.   ferrous gluconate (FERGON) 324 MG tablet Take 324 mg by mouth daily with breakfast.   furosemide (LASIX) 20 MG tablet 1 tab by mouth daily in morning with Spironolactone   Multiple Vitamin (MULTIVITAMIN WITH MINERALS) TABS tablet Take 1 tablet by mouth daily.   pantoprazole (PROTONIX) 40 MG tablet Take 1 tablet (40 mg total) by mouth 2 (two) times daily.   spironolactone (ALDACTONE) 50 MG tablet 1 tab by mouth in morning with furosemide   tamsulosin (FLOMAX) 0.4 MG CAPS capsule Take 1 capsule (0.4 mg total) by mouth daily after supper.   No Known Allergies   Review of Systems    Objective:   BP 120/60 (BP Location: Right Arm, Patient Position: Sitting, Cuff Size: Normal)   Pulse 70   Resp 16   Ht 6\' 2"  (1.88 m)   Wt 185 lb (83.9 kg)   BMI 23.75 kg/m   Physical Exam NAD Good coloring Lungs:  CTA CV:  RRR without murmur or rub.  Radial and DP pulses normal and equal Abd:  S, NT, + BS, no palpable fluid LE:  woodiness of pretib areas with minimal pitting. Neuro:  a & O x 3.  Thinking is clear.  Assessment & Plan   Cirrhosis with esophageal varices and anemia from UGI bleed, S/P shunt:  weaned off midodrine without issue.  Has Carvedilol listed as a med to continue in discharge orders following TIPS, but ultimately discussed with him via phone to hold restarting the Carvedilol and discuss with GI when has follow up soon.   Following TIPS, suspect not needed. CBC, CMP today.   He will be out of lactulose after today--refilled until can discuss with GI.

## 2023-02-05 LAB — COMPREHENSIVE METABOLIC PANEL
ALT: 17 IU/L (ref 0–44)
AST: 35 IU/L (ref 0–40)
Albumin: 3.6 g/dL — ABNORMAL LOW (ref 3.8–4.9)
Alkaline Phosphatase: 140 IU/L — ABNORMAL HIGH (ref 44–121)
BUN/Creatinine Ratio: 10 (ref 9–20)
BUN: 7 mg/dL (ref 6–24)
Bilirubin Total: 1 mg/dL (ref 0.0–1.2)
CO2: 21 mmol/L (ref 20–29)
Calcium: 9.3 mg/dL (ref 8.7–10.2)
Chloride: 104 mmol/L (ref 96–106)
Creatinine, Ser: 0.73 mg/dL — ABNORMAL LOW (ref 0.76–1.27)
Globulin, Total: 2.8 g/dL (ref 1.5–4.5)
Glucose: 92 mg/dL (ref 70–99)
Potassium: 3.9 mmol/L (ref 3.5–5.2)
Sodium: 141 mmol/L (ref 134–144)
Total Protein: 6.4 g/dL (ref 6.0–8.5)
eGFR: 110 mL/min/{1.73_m2} (ref >59)

## 2023-02-05 LAB — CBC WITH DIFFERENTIAL/PLATELET
Basophils Absolute: 0 10*3/uL (ref 0.0–0.2)
Basos: 1 %
EOS (ABSOLUTE): 0.1 10*3/uL (ref 0.0–0.4)
Eos: 3 %
Hematocrit: 33.3 % — ABNORMAL LOW (ref 37.5–51.0)
Hemoglobin: 10.3 g/dL — ABNORMAL LOW (ref 13.0–17.7)
Immature Grans (Abs): 0 10*3/uL (ref 0.0–0.1)
Immature Granulocytes: 0 %
Lymphocytes Absolute: 1 10*3/uL (ref 0.7–3.1)
Lymphs: 30 %
MCH: 27.8 pg (ref 26.6–33.0)
MCHC: 30.9 g/dL — ABNORMAL LOW (ref 31.5–35.7)
MCV: 90 fL (ref 79–97)
Monocytes Absolute: 0.4 10*3/uL (ref 0.1–0.9)
Monocytes: 11 %
Neutrophils Absolute: 1.8 10*3/uL (ref 1.4–7.0)
Neutrophils: 55 %
Platelets: 76 10*3/uL — CL (ref 150–450)
RBC: 3.7 x10E6/uL — ABNORMAL LOW (ref 4.14–5.80)
RDW: 14.5 % (ref 11.6–15.4)
WBC: 3.4 10*3/uL (ref 3.4–10.8)

## 2023-02-12 ENCOUNTER — Ambulatory Visit: Admission: RE | Admit: 2023-02-12 | Payer: Medicaid Other | Source: Ambulatory Visit

## 2023-02-12 DIAGNOSIS — I8500 Esophageal varices without bleeding: Secondary | ICD-10-CM

## 2023-02-12 DIAGNOSIS — I864 Gastric varices: Secondary | ICD-10-CM

## 2023-02-12 HISTORY — PX: IR RADIOLOGIST EVAL & MGMT: IMG5224

## 2023-02-12 NOTE — Progress Notes (Signed)
Chief Complaint: Cirrhosis, SP TIPS  Referring Provider(s): Doristine Locks   Supervising Physician: Gilmer Mor   Patient Status: Stephen Miranda   History of Present Illness: Stephen Miranda is a 52 y.o. male presenting as a scheduled follow up to VIR clinic today, SP urgent TIPS 01/03/23 for variceal bleeding at [facility].  He also previously underwent BRTO 07/04/22 for UGI hemorrhage.   He joins me today by way of telemedicine visit.  We confirmed his identity with 2 personal identifiers.   History:  Stephen Miranda is a gentleman with alcohol abuse and alcohol-related cirrhosis with esophageal varices with prior history of GI bleeding presented to the ED on 12/26/22 with hematemesis.   Recurrent GI bleed from esophageal varices = He is status post esophageal variceal band ligation 12/27/2022    He is known to IR service for previous BRTO done 07/04/22 by Dr. Miles Costain.   CTA on his admission showed successful prior BRTO changes, and favorable for TIPS.   Na MELD = 11  Child Class B  Interval History: Stephen Miranda was treated 01/03/23 and then was discharged 01/04/23.    He tells me today that he is doing well.  He denies any recurrent blood in the stool.  Denies any tremors or feeling dizzy.  He does tell me that he often feels fatigued, and takes naps during the day.  He denies any jaundice or icterus.  He is taking lactulose daily, 2 doses.  He is moving his bowels twice daily.  He denies any concern for abdominal fluid buildup.   He has upcoming appointment with [facility] in October.     Past Medical History:  Diagnosis Date   Chronic insomnia 05/23/2016   Has stated in past since age 64 yo, but worse in recent years with restless legs.   Elevated liver enzymes 04/24/2015   04/18/2015:  AST:  115     ALT:  90. Likely due to alcohol intake   Malignant melanoma of back (HCC) 02/07/2015   Left upper back:  [facility]. Irene Limbo [facility]   Restless legs 05/23/2016   Seborrheic  dermatitis 05/23/2016    Past Surgical History:  Procedure Laterality Date   ESOPHAGEAL BANDING  12/26/2022   Procedure: ESOPHAGEAL BANDING;  Surgeon: Imogene Burn, MD;  Location: [facility] ENDOSCOPY;  Service: Gastroenterology;;   ESOPHAGOGASTRODUODENOSCOPY N/A 07/03/2022   Procedure: ESOPHAGOGASTRODUODENOSCOPY (EGD);  Surgeon: Beverley Fiedler, MD;  Location: [facility] ENDOSCOPY;  Service: Gastroenterology;  Laterality: N/A;   ESOPHAGOGASTRODUODENOSCOPY (EGD) WITH PROPOFOL N/A 05/20/2019   Procedure: ESOPHAGOGASTRODUODENOSCOPY (EGD) WITH PROPOFOL;  Surgeon: Meridee Score Netty Starring., MD;  Location: [facility] ENDOSCOPY;  Service: Gastroenterology;  Laterality: N/A;   ESOPHAGOGASTRODUODENOSCOPY (EGD) WITH PROPOFOL N/A 12/26/2022   Procedure: ESOPHAGOGASTRODUODENOSCOPY (EGD) WITH PROPOFOL;  Surgeon: Imogene Burn, MD;  Location: [facility] ENDOSCOPY;  Service: Gastroenterology;  Laterality: N/A;   Excision Malignant Melanoma Left 02/07/2015   Left upper back   IR ANGIOGRAM SELECTIVE EACH ADDITIONAL VESSEL  07/04/2022   IR EMBO ART  VEN HEMORR LYMPH EXTRAV  INC GUIDE ROADMAPPING  07/04/2022   IR PARACENTESIS  07/18/2022   IR TIPS  01/03/2023   IR US GUIDE VASC ACCESS RIGHT  07/04/2022   IR US GUIDE VASC ACCESS RIGHT  01/03/2023   IR VENOGRAM RENAL UNI LEFT  07/04/2022   PARACENTESIS  07/18/2022   RADIOLOGY WITH ANESTHESIA N/A 07/04/2022   Procedure: IR WITH ANESTHESIA - TIPS;  Surgeon: Radiologist, Medication, MD;  Location: [facility] OR;  Service: Radiology;  Laterality: N/A;   RADIOLOGY WITH ANESTHESIA N/A 01/03/2023   Procedure: TIPS WITH ANESTHESIA;  Surgeon: Pernell Dupre, MD;  Location: [facility] OR;  Service: Radiology;  Laterality: N/A;    Allergies: Patient has no known allergies.  Medications: Prior to Admission medications   Medication Sig Start Date End Date Taking? Authorizing Provider  escitalopram (LEXAPRO) 5 MG tablet Take 1 tablet (5 mg total) by mouth daily. 11/18/22   Julieanne Manson, MD  ferrous gluconate  (FERGON) 324 MG tablet Take 324 mg by mouth daily with breakfast.    [provider]  furosemide (LASIX) 20 MG tablet 1 tab by mouth daily in morning with Spironolactone 07/11/22   Julieanne Manson, MD  Lactulose 20 GM/30ML SOLN 30 ml by mouth 3 times daily 02/04/23   Julieanne Manson, MD  midodrine (PROAMATINE) 5 MG tablet Take 1 tablet (5 mg total) by mouth 2 (two) times daily with a meal. Patient not taking: Reported on 02/04/2023 01/04/23   Leroy Sea, MD  Multiple Vitamin (MULTIVITAMIN WITH MINERALS) TABS tablet Take 1 tablet by mouth daily. 05/23/19   Calvert Cantor, MD  pantoprazole (PROTONIX) 40 MG tablet Take 1 tablet (40 mg total) by mouth 2 (two) times daily. 07/11/22   Julieanne Manson, MD  spironolactone (ALDACTONE) 50 MG tablet 1 tab by mouth in morning with furosemide 07/11/22   Julieanne Manson, MD  tamsulosin (FLOMAX) 0.4 MG CAPS capsule Take 1 capsule (0.4 mg total) by mouth daily after supper. 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  Tobacco Use   Smoking status: Every Day    Current packs/day: 0.50    Average packs/day: 0.5 packs/day for 20.0 years (10.0 ttl pk-yrs)    Types: Cigarettes    Passive exposure: Never   Smokeless tobacco: Never   Tobacco comments:    prescribed Chantix in past, but did not take  Vaping Use   Vaping status: Never Used  Substance and Sexual Activity   Alcohol use: Not Currently    Alcohol/week: 4.0 standard drinks of alcohol    Types: 2 Cans of beer, 2 Shots of liquor per week    Comment: Nightly   Drug use:  No   Sexual activity: Not on file  Other Topics Concern   Not on file  Social History Narrative   Currently lives with parents   Social Determinants of Health   Financial Resource Strain: Not on file  Food Insecurity: Not on file  Transportation Needs: Not on file  Physical Activity: Not on file  Stress: Not on file  Social Connections: Not on file       Review of Systems  Review of Systems: A 12 point ROS discussed and pertinent positives are indicated in the HPI above.  All other systems are negative.    Physical Exam No direct physical exam was performed (except for noted visual exam findings with Video Visits).    Vital Signs: There were no vitals taken for this visit.  Imaging: No results found.  Labs:  CBC: Recent Labs    01/03/23 0438 01/04/23 0336 01/07/23 1450 02/04/23 1102  WBC 4.6 9.0 7.3 3.4  HGB 7.9* 9.1* 8.9* 10.3*  HCT 24.6* 28.1* 27.1* 33.3*  PLT 107* 162 153 76*    COAGS: Recent Labs    07/10/22 1554 12/26/22 1810 12/27/22 0245 01/03/23 0438  INR 1.6* 1.5* 1.4* 1.4*  APTT  --   --  32  --     BMP: Recent Labs    12/30/22 0436 12/31/22 0420 01/01/23 0349 01/03/23 0438 01/07/23 1450 02/04/23 1102  NA 134* 137 136 137 141 141  K 3.4* 3.5 3.5 3.7 4.0 3.9  CL 105 105 106 106 106 104  CO2 22 23 23 23 23 21   GLUCOSE 94 89 91 89 83 92  BUN 9 5* <5* <5* 5* 7  CALCIUM 7.7* 8.0* 7.8* 8.1* 8.7 9.3  CREATININE 0.78 0.78 0.78 0.77 0.68* 0.73*  GFRNONAA >60 >60 >60 >60  --   --     LIVER FUNCTION TESTS: Recent Labs    12/31/22 0420 01/03/23 0438 01/07/23 1450 02/04/23 1102  BILITOT 0.8 0.9 1.1 1.0  AST 21 25 44* 35  ALT 15 13 30 17   ALKPHOS 38 50 84 140*  PROT 4.7* 4.9* 5.6* 6.4  ALBUMIN 2.2* 2.4* 3.3* 3.6*    TUMOR MARKERS: Recent Labs    07/24/22 1432  AFPTM 5.1    Assessment and Plan:   Stephen Vejar is 52 year old male with cirrhosis, prior multiple episodes of UGI hemorrhage, now SP TIPS on 01/03/23.   He is  doing well in recovery.    He has GI follow up in October.  He continues to take daily lactulose, and has not had any recurrent episodes of bleeding.   We discussed surveillance at this point.    Plan: - 6 month follow up office visit with Dr. Loreta Ave - Liver/TIPS duplex before the office visit in 6 months.  - continue current care  Electronically Signed: Gilmer Mor 02/12/2023, 10:19 AM   I spent a total of    25 Minutes in remote  clinical consultation, greater than 50% of which was counseling/coordinating care for cirrhosis, SP TIPS.    Visit type: Audio only (telephone). Audio (no video) only due to patient's lack of internet/smartphone capability. Alternative for in-person consultation at [facility], [address] [place], [place], [place]. This visit type was conducted due to national recommendations for restrictions regarding the COVID-19 Pandemic (e.g. social distancing).  This format is felt to be most appropriate for this patient at this time.  All issues noted in this document were discussed and addressed.

## 2023-02-18 ENCOUNTER — Other Ambulatory Visit: Payer: Medicaid Other

## 2023-02-18 ENCOUNTER — Telehealth: Payer: Self-pay | Admitting: Gastroenterology

## 2023-02-18 VITALS — BP 102/58 | HR 68

## 2023-02-18 DIAGNOSIS — Z0130 Encounter for examination of blood pressure without abnormal findings: Secondary | ICD-10-CM | POA: Diagnosis not present

## 2023-02-18 NOTE — Telephone Encounter (Signed)
Jacqlyn Larsen from [employer] is calling about the TIP done 6/14 and whether PT needs to stay on beta blockers. Call [phone] or send staff message through Epic.

## 2023-02-19 NOTE — Telephone Encounter (Signed)
Phone contact with the patient. Rescheduled the office visit to 03/12/23 at 3:30 pm with Dr Meridee Score. Patient agrees to this and will call if he finds he is unable to keep the appointment.

## 2023-02-19 NOTE — Telephone Encounter (Signed)
Phone contact with the clinic advising the message has been forwarded to them for Dr Mulberry's review.

## 2023-03-04 ENCOUNTER — Other Ambulatory Visit: Payer: Self-pay | Admitting: Internal Medicine

## 2023-03-12 ENCOUNTER — Encounter: Payer: Self-pay | Admitting: Gastroenterology

## 2023-03-12 ENCOUNTER — Ambulatory Visit: Payer: Medicaid Other | Admitting: Gastroenterology

## 2023-03-14 NOTE — Progress Notes (Signed)
Patient no showed for this GI clinic visit. Patient may call back to reschedule as able. If he reschedules and no-shows to clinic again he will be dismissed from the [facility] practice.  Corliss Parish, MD [facility] Office # [phone]

## 2023-04-23 ENCOUNTER — Ambulatory Visit: Payer: Medicaid Other | Admitting: Internal Medicine

## 2023-05-12 ENCOUNTER — Ambulatory Visit: Payer: Medicaid Other | Admitting: Internal Medicine

## 2023-06-24 ENCOUNTER — Ambulatory Visit: Payer: Medicaid Other | Admitting: Internal Medicine

## 2023-06-24 ENCOUNTER — Ambulatory Visit: Payer: Medicaid Other | Admitting: Dermatology

## 2023-06-24 ENCOUNTER — Encounter: Payer: Self-pay | Admitting: Dermatology

## 2023-06-24 VITALS — BP 110/60 | HR 78

## 2023-06-24 VITALS — BP 120/64 | HR 76 | Resp 16 | Ht 74.0 in | Wt 202.0 lb

## 2023-06-24 DIAGNOSIS — L84 Corns and callosities: Secondary | ICD-10-CM | POA: Diagnosis not present

## 2023-06-24 DIAGNOSIS — L570 Actinic keratosis: Secondary | ICD-10-CM | POA: Diagnosis not present

## 2023-06-24 DIAGNOSIS — D492 Neoplasm of unspecified behavior of bone, soft tissue, and skin: Secondary | ICD-10-CM

## 2023-06-24 DIAGNOSIS — W908XXA Exposure to other nonionizing radiation, initial encounter: Secondary | ICD-10-CM

## 2023-06-24 DIAGNOSIS — Z23 Encounter for immunization: Secondary | ICD-10-CM

## 2023-06-24 DIAGNOSIS — D485 Neoplasm of uncertain behavior of skin: Secondary | ICD-10-CM

## 2023-06-24 DIAGNOSIS — K7031 Alcoholic cirrhosis of liver with ascites: Secondary | ICD-10-CM | POA: Diagnosis not present

## 2023-06-24 DIAGNOSIS — Z85820 Personal history of malignant melanoma of skin: Secondary | ICD-10-CM | POA: Diagnosis not present

## 2023-06-24 DIAGNOSIS — Z734 Inadequate social skills, not elsewhere classified: Secondary | ICD-10-CM

## 2023-06-24 DIAGNOSIS — C44319 Basal cell carcinoma of skin of other parts of face: Secondary | ICD-10-CM

## 2023-06-24 DIAGNOSIS — L219 Seborrheic dermatitis, unspecified: Secondary | ICD-10-CM

## 2023-06-24 DIAGNOSIS — D62 Acute posthemorrhagic anemia: Secondary | ICD-10-CM

## 2023-06-24 MED ORDER — KETOCONAZOLE 2 % EX CREA: TOPICAL_CREAM | CUTANEOUS | 6 refills | Status: DC

## 2023-06-24 NOTE — Patient Instructions (Signed)

## 2023-06-24 NOTE — Progress Notes (Unsigned)
   New Patient Visit   Subjective  Stephen Miranda is a 52 y.o. male who presents for the following: growth on jawline. Pt here to follow up from s melanoma on left shoulder in 2015 and for a growth on his left jawline for over 1 year.  The following portions of the chart were reviewed this encounter and updated as appropriate: medications, allergies, medical history  Review of Systems:  No other skin or systemic complaints except as noted in HPI or Assessment and Plan.  Objective  Well appearing patient in no apparent distress; mood and affect are within normal limits.  A focused examination was performed of the following areas: Left shoulder and left jawline  Relevant exam findings are noted in the Assessment and Plan.  Left Mandible Crusted papule        Assessment & Plan    Neoplasm of uncertain behavior of skin Left Mandible  Skin / nail biopsy Type of biopsy: tangential   Timeout: patient name, date of birth, surgical site, and procedure verified   Procedure prep:  Patient was prepped and draped in usual sterile fashion Prep type:  Isopropyl alcohol Anesthesia: the lesion was anesthetized in a standard fashion   Anesthetic:  1% lidocaine w/ epinephrine 1-100,000 buffered w/ 8.4% NaHCO3 Instrument used: DermaBlade   Hemostasis achieved with: aluminum chloride   Outcome: patient tolerated procedure well   Post-procedure details: sterile dressing applied and wound care instructions given   Dressing type: pressure dressing and petrolatum    Specimen 1 - Surgical pathology Differential Diagnosis: R/O NMSC  Check Margins: No  ACTINIC KERATOSIS Exam: Erythematous thin papules/macules with gritty scale at the face  Actinic keratoses are precancerous spots that appear secondary to cumulative UV radiation exposure/sun exposure over time. They are chronic with expected duration over 1 year. A portion of actinic keratoses will progress to squamous cell carcinoma of the  skin. It is not possible to reliably predict which spots will progress to skin cancer and so treatment is recommended to prevent development of skin cancer.  Recommend daily broad spectrum sunscreen SPF 30+ to sun-exposed areas, reapply every 2 hours as needed.  Recommend staying in the shade or wearing long sleeves, sun glasses (UVA+UVB protection) and wide brim hats (4-inch brim around the entire circumference of the hat). Call for new or changing lesions.  Treatment Plan: Will consider 5 fu  after treatment of skin cancer   HISTORY OF MELANOMA- left upper back - No evidence of recurrence today - Recommend regular full body skin exams - Recommend daily broad spectrum sunscreen SPF 30+ to sun-exposed areas, reapply every 2 hours as needed.  - Call if any new or changing lesions are noted between office visits  Return in 13 days (on 07/07/2023), or Mohs at 11:15 AM.  I, Tillie Fantasia, CMA, am acting as scribe for Gwenith Daily, MD.   Documentation: I have reviewed the above documentation for accuracy and completeness, and I agree with the above.  Gwenith Daily, MD

## 2023-06-24 NOTE — Progress Notes (Signed)
Subjective:    Patient ID: Stephen Miranda, male   DOB: Mar 20, 1971, 52 y.o.   MRN: [id number]   HPI   Cirrhosis with ascites and esophageal varices:  underwent TIPS in June following UGI bleed.  Taking Lactulose 3 times daily with loose stool 2-3 times daily.  No melena, no hematemesis.  No nausea or vomiting.  No abdominal pain.  Abdomen feels loose--no fluid he can ascertain.  No swelling of legs.  Just fatigued much of time.  Moving to apartment at [place], near [place].  MIssed appt with Dr. Meridee Score, GI in August.  Discussed he will be dismissed if misses again and should be followed by GI.    2.  Callus on right foot:  Makes it hard to walk.    3.  Depression:  cannot say if controlled.  Up at 8:30 a.m.  eats breakfast and showers.  Computer games.  May go shopping.  May go over to parents house and play with dog.  Goes out to dinner with parents twice weekly with parents.    4. Flaking and redness of face in brow, nasolabial fold, beard area for past 2 years.  Seems to be getting worse here recently.  Also, at times behind ears.    Current Meds  Medication Sig   escitalopram (LEXAPRO) 5 MG tablet TAKE 1 TABLET BY MOUTH DAILY   ferrous gluconate (FERGON) 324 MG tablet Take 324 mg by mouth daily with breakfast.   furosemide (LASIX) 20 MG tablet 1 tab by mouth daily in morning with Spironolactone   Lactulose 20 GM/30ML SOLN 30 ml by mouth 3 times daily   Multiple Vitamin (MULTIVITAMIN WITH MINERALS) TABS tablet Take 1 tablet by mouth daily.   pantoprazole (PROTONIX) 40 MG tablet Take 1 tablet (40 mg total) by mouth 2 (two) times daily.   spironolactone (ALDACTONE) 50 MG tablet 1 tab by mouth in morning with furosemide   tamsulosin (FLOMAX) 0.4 MG CAPS capsule Take 1 capsule (0.4 mg total) by mouth daily after supper.   No Known Allergies   Review of Systems    Objective:   BP 120/64 (BP Location: Right Arm, Patient Position: Sitting, Cuff Size: Normal)   Pulse 76    Resp 16   Ht 6\' 2"  (1.88 m)   Wt 202 lb (91.6 kg)   BMI 25.94 kg/m   Physical Exam NAD Color much improved, though chronically ill appearing HEENT:  PERRL, EOMI.  Face with flaking on erythematous background at glabella, nasolabial folds and onto chin. Neck:  Supple, No adenopathy Chest:  CTA CV:  RRR without murmur or rub.  Radial and DP pulses normal and equal.   Abd:  S, NT, No fluid wave or distention.  No HSM or mass, + BS LE:  no edema Right Foot:  callus over plantar aspect of 4th MT head.  Tender to palpation.   Neuro:  A & O x 3.  CN  II-XII grossly intact.  Gait normal.  Thinking is clear.     Assessment & Plan   Cirrhosis with Ascites and UGI bleed, S/P TIPS:  Stable with fluids and thought process.  CMP.  Discussed has missed appts with GI and needs to maintain follow up with Dr. Silvano Rusk back.  2.  UGI bleed with anemia:  Suspect much improved with good color today.  CBC  3.  Seborrheic Dermatitis:  ketoconazole cream twice daily to affected areas.    4.  Depression and poor socialization:  refer to [employer] program and perhaps move to counseling with time.    5.  Right foot callus: referral to Podiatry.    6.  HM:  Influenza and Shingrix today.

## 2023-06-25 LAB — CBC WITH DIFFERENTIAL/PLATELET
Basophils Absolute: 0 10*3/uL (ref 0.0–0.2)
Basos: 1 %
EOS (ABSOLUTE): 0.1 10*3/uL (ref 0.0–0.4)
Eos: 2 %
Hematocrit: 41.2 % (ref 37.5–51.0)
Hemoglobin: 14.1 g/dL (ref 13.0–17.7)
Immature Grans (Abs): 0 10*3/uL (ref 0.0–0.1)
Immature Granulocytes: 0 %
Lymphocytes Absolute: 1.2 10*3/uL (ref 0.7–3.1)
Lymphs: 27 %
MCH: 32.1 pg (ref 26.6–33.0)
MCHC: 34.2 g/dL (ref 31.5–35.7)
MCV: 94 fL (ref 79–97)
Monocytes Absolute: 0.4 10*3/uL (ref 0.1–0.9)
Monocytes: 8 %
Neutrophils Absolute: 2.9 10*3/uL (ref 1.4–7.0)
Neutrophils: 62 %
Platelets: 87 10*3/uL — CL (ref 150–450)
RBC: 4.39 x10E6/uL (ref 4.14–5.80)
RDW: 13.4 % (ref 11.6–15.4)
WBC: 4.6 10*3/uL (ref 3.4–10.8)

## 2023-06-25 LAB — COMPREHENSIVE METABOLIC PANEL
ALT: 21 [IU]/L (ref 0–44)
AST: 32 [IU]/L (ref 0–40)
Albumin: 3.7 g/dL — ABNORMAL LOW (ref 3.8–4.9)
Alkaline Phosphatase: 131 [IU]/L — ABNORMAL HIGH (ref 44–121)
BUN/Creatinine Ratio: 11 (ref 9–20)
BUN: 9 mg/dL (ref 6–24)
Bilirubin Total: 1.2 mg/dL (ref 0.0–1.2)
CO2: 21 mmol/L (ref 20–29)
Calcium: 9.6 mg/dL (ref 8.7–10.2)
Chloride: 106 mmol/L (ref 96–106)
Creatinine, Ser: 0.83 mg/dL (ref 0.76–1.27)
Globulin, Total: 2.5 g/dL (ref 1.5–4.5)
Glucose: 135 mg/dL — ABNORMAL HIGH (ref 70–99)
Potassium: 4.2 mmol/L (ref 3.5–5.2)
Sodium: 140 mmol/L (ref 134–144)
Total Protein: 6.2 g/dL (ref 6.0–8.5)
eGFR: 106 mL/min/{1.73_m2} (ref >59)

## 2023-06-25 LAB — SURGICAL PATHOLOGY

## 2023-06-30 ENCOUNTER — Telehealth: Payer: Self-pay

## 2023-06-30 NOTE — Telephone Encounter (Signed)
Spoke with pt and gave him bx results and recommendations

## 2023-06-30 NOTE — Telephone Encounter (Signed)
-----   Message from [facility] sent at 06/26/2023  9:43 AM EST ----- BCC- left mandible- Mohs with me  Please call patient to discuss diagnosis and schedule for Mohs surgery.

## 2023-07-07 ENCOUNTER — Encounter: Payer: Self-pay | Admitting: Dermatology

## 2023-07-07 ENCOUNTER — Ambulatory Visit: Payer: Medicaid Other | Admitting: Dermatology

## 2023-07-07 VITALS — BP 94/56 | HR 62 | Temp 98.6°F

## 2023-07-07 DIAGNOSIS — L579 Skin changes due to chronic exposure to nonionizing radiation, unspecified: Secondary | ICD-10-CM

## 2023-07-07 DIAGNOSIS — C44310 Basal cell carcinoma of skin of unspecified parts of face: Secondary | ICD-10-CM

## 2023-07-07 DIAGNOSIS — L814 Other melanin hyperpigmentation: Secondary | ICD-10-CM

## 2023-07-07 MED ORDER — TRAMADOL HCL 50 MG PO TABS: 50.0000 mg | ORAL_TABLET | Freq: Four times a day (QID) | ORAL | 0 refills | Status: DC | PRN

## 2023-07-07 NOTE — Progress Notes (Signed)
Follow-Up Visit   Subjective  Stephen Miranda is a 52 y.o. male who presents for the following: Mohs of a BCC on pt left mandible.  The following portions of the chart were reviewed this encounter and updated as appropriate: medications, allergies, medical history  Review of Systems:  No other skin or systemic complaints except as noted in HPI or Assessment and Plan.  Objective  Well appearing patient in no apparent distress; mood and affect are within normal limits.  A focused examination was performed of the following areas:  Left mandible  Relevant physical exam findings are noted in the Assessment and Plan.   Left Anterior Mandible Pink pearly papule or plaque with arborizing vessels.    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 No   Was the anticoagulation regimen changed prior to Mohs? No    Procedure Details: Timeout: pre-procedure verification complete Procedure Prep: patient was prepped and draped in usual sterile fashion Prep type: chlorhexidine Biopsy accession number: [id number] Biopsy lab: [facility] Date of biopsy: 06/24/2023 Frozen section biopsy performed: No   Specimen debulked: No   Pre-Op diagnosis: basal cell carcinoma BCC subtype: nodular MohsAIQ Surgical site (if tumor spans multiple areas, please select predominant area): cheek (including jawline) Surgery side: left Surgical site (from skin exam): Left Anterior Mandible Pre-operative length (cm): 2 Pre-operative width (cm): 1.5 Indications for Mohs surgery: anatomic location where tissue conservation is critical Previously treated? No    Micrographic Surgery Details: Post-operative length (cm): 2.6 Post-operative width (cm): 2 Number of Mohs stages: 1 Is  this a complex case (associate members only): No    Stage 1    Tumor features identified on Mohs section: no tumor identified    Depth of defect after stage: subcutaneous fat    Perineural invasion: no perineural invasion  Patient tolerance of procedure: tolerated well, no immediate complications  Reconstruction: Was the defect reconstructed? Yes   Was reconstruction performed by the same Mohs surgeon? Yes   Setting of reconstruction: outpatient office When was reconstruction performed? same day Type of reconstruction: linear Linear reconstruction: complex Length of linear repair (cm): 7  Opioids: Did the patient receive a prescription for opioid/narcotic related to Mohs surgery? Yes   Indications for opioid/narcotics: patient required additional pain relief despite trial of non-opioid analgesia  Antibiotics: Does patient meet AHA guidelines for endocarditis?: No   Does patient meet AHA guidelines for orthopedic prophylaxis?: No   Were antibiotics given on the day of surgery?: No   Did surgery breach mucosa, expose cartilage/bone, involve an area of lymphedema/inflamed/infected tissue? No    Skin repair Complexity:  Complex Final length (cm):  7 Informed consent: discussed and consent obtained   Timeout: patient name, date of birth, surgical site, and procedure verified   Procedure prep:  Patient was prepped and draped in usual sterile fashion Prep type:  Chlorhexidine Anesthesia: the lesion was anesthetized in a standard fashion   Anesthetic:  1% lidocaine w/ epinephrine 1-100,000 buffered w/ 8.4% NaHCO3 Reason for type of repair: reduce tension to allow closure and preserve normal anatomy   Undermining: edges undermined   Subcutaneous layers (deep stitches):  Suture size:  4-0 Suture type: Vicryl (polyglactin 910)   Stitches:  Buried horizontal mattress Fine/surface layer approximation (top stitches):  Suture size:  5-0 Suture type:  fast-absorbing plain gut   Stitches:  simple running   Suture removal (days):  0 Hemostasis achieved with: suture, pressure and electrodesiccation Outcome: patient tolerated procedure well with no complications   Post-procedure details: sterile dressing applied and wound care instructions given   Dressing type: pressure dressing and petrolatum   Related Medications traMADol (ULTRAM) 50 MG tablet Take 1 tablet (50 mg total) by mouth every 6 (six) hours as needed for up to 8 doses.   Return in about 4 weeks (around 08/04/2023).  Dominga Ferry, Surg Tech III, am acting as scribe for Gwenith Daily, MD.    07/07/2023  HISTORY OF PRESENT ILLNESS  Stephen Miranda is seen in consultation at the request of Dr. Caralyn Guile for biopsy-proven Basal Cell Carcinoma of the left jawline. They note that the area has been present for about 6 months increasing in size with bleeding and pain.  There is no history of previous treatment.  Reports no other new or changing lesions and has no other complaints today.  Medications and allergies: see patient chart.  Review of systems: Reviewed 8 systems and notable for the above skin cancer.  All other systems reviewed are unremarkable/negative, unless noted in the HPI. Past medical history, surgical history, family history, social history were also reviewed and are noted in the chart/questionnaire.    PHYSICAL EXAMINATION  General: Well-appearing, in no acute distress, alert and oriented x 4. Vitals reviewed in chart (if available).   Skin: Exam reveals a 2.0 x 1.5 cm erythematous papule and biopsy scar on the left jawline. There are rhytids, telangiectasias, and lentigines, consistent with photodamage.  Biopsy report(s) reviewed, confirming the diagnosis.   ASSESSMENT  1) Basal Cell Carcinoma of the left jawline 2) photodamage 3) solar lentigines   PLAN   1. Due to location, size, histology, or recurrence and the likelihood of subclinical extension as well as the need to conserve normal surrounding  tissue, the patient was deemed acceptable for Mohs micrographic surgery (MMS).  The nature and purpose of the procedure, associated benefits and risks including recurrence and scarring, possible complications such as pain, infection, and bleeding, and alternative methods of treatment if appropriate were discussed with the patient during consent. The lesion location was verified by the patient, by reviewing previous notes, pathology reports, and by photographs as well as angulation measurements if available.  Informed consent was reviewed and signed by the patient, and timeout was performed at 11:15 AM. See op note below.  2. For the photodamage and solar lentigines, sun protection discussed/information given on OTC sunscreens, and we recommend continued regular follow-up with primary dermatologist every 6 months or sooner for any growing, bleeding, or changing lesions. 3. Prognosis and future surveillance discussed. 4. Letter with treatment outcome sent to referring provider. 5. Pain acetaminophen/ibuprofen/tramadol 50 mg  MOHS MICROGRAPHIC SURGERY AND RECONSTRUCTION  Initial size:   2.0 x 1.5 cm Surgical defect/wound size: 2.6 x 2.0 cm Anesthesia:    0.33% lidocaine with 1:200,000 epinephrine EBL:    <5 mL Complications:  None Repair type:   Complex SQ suture:   4-0 Vicryl Cutaneous suture:  6-0 Plain gut Final size of the repair: 7.0 cm  Stages: 1  STAGE I: Anesthesia achieved with 0.5% lidocaine with 1:200,000 epinephrine. ChloraPrep applied. 1 section(s) excised using Mohs technique (this includes total peripheral and deep tissue margin excision and evaluation with frozen sections, excised and interpreted by the same physician). The tumor was first debulked and then excised with an approx. 2mm  margin.  Hemostasis was achieved with electrocautery as needed.  The specimen was then oriented, subdivided/relaxed, inked, and processed using Mohs technique.    Frozen section analysis revealed a  clear deep and peripheral margin  Reconstruction  The surgical wound was then cleaned, prepped, and re-anesthetized as above. Wound edges were undermined extensively along at least one entire edge and at a distance equal to or greater than the width of the defect (see wound defect size above) in order to achieve closure and decrease wound tension and anatomic distortion. Redundant tissue repair including standing cone removal was performed. Hemostasis was achieved with electrocautery. Subcutaneous and epidermal tissues were approximated with the above sutures. The surgical site was then lightly scrubbed with sterile, saline-soaked gauze. The area was then bandaged using Vaseline ointment, non-adherent gauze, gauze pads, and tape to provide an adequate pressure dressing. The patient tolerated the procedure well, was given detailed written and verbal wound care instructions, and was discharged in good condition.   The patient will follow-up: 4 weeks.    Documentation: I have reviewed the above documentation for accuracy and completeness, and I agree with the above.  Gwenith Daily, MD

## 2023-07-10 ENCOUNTER — Encounter: Payer: Self-pay | Admitting: Dermatology

## 2023-07-14 ENCOUNTER — Other Ambulatory Visit: Payer: Self-pay | Admitting: Internal Medicine

## 2023-07-16 ENCOUNTER — Encounter: Payer: Self-pay | Admitting: Internal Medicine

## 2023-07-16 DIAGNOSIS — L84 Corns and callosities: Secondary | ICD-10-CM | POA: Insufficient documentation

## 2023-07-24 ENCOUNTER — Encounter: Payer: Self-pay | Admitting: Podiatry

## 2023-07-24 ENCOUNTER — Ambulatory Visit: Payer: Medicaid Other | Admitting: Podiatry

## 2023-07-24 DIAGNOSIS — L84 Corns and callosities: Secondary | ICD-10-CM | POA: Diagnosis not present

## 2023-07-24 DIAGNOSIS — M7741 Metatarsalgia, right foot: Secondary | ICD-10-CM

## 2023-07-24 NOTE — Patient Instructions (Signed)
 More silicone pads can be purchased from:  [email or web address]

## 2023-07-24 NOTE — Progress Notes (Signed)
  Subjective:  Patient ID: Stephen Miranda, male    DOB: 04/07/1971,  MRN: [id number]  Chief Complaint  Patient presents with   Callouses    He has noticed a callus that keeps coming back on the right foot between the 3rd and 4th digit.     53 y.o. male presents with the above complaint. History confirmed with patient.  Patient does not have history of diabetes.  Patient does have callus present located at the right foot subfourth metatarsal head region causing pain.   Objective:  Physical Exam: warm, good capillary refill nail exam normal nails without lesions DP pulses palpable, PT pulses palpable, and protective sensation intact Left Foot: Nail plates within normal limits.  No open wounds, lesions or nodules appreciated Right Foot: Nail plates within normal limits.  Right foot hyperkeratotic lesion noted subfourth metatarsal head region.  Nucleated core present.  Painful on direct palpation Decreased ankle joint dorsiflexion appreciated bilaterally that does improve with knees flexed.  Assessment:   1. Metatarsalgia of right foot   2. Callus of foot      Plan:  Patient was evaluated and treated and all questions answered.  #Hyperkeratotic lesions/pre ulcerative calluses present right subfourth metatarsal head All symptomatic hyperkeratoses x 1 separate lesions were safely debrided with a sterile # 312 scalpel blade to patient's level of comfort without incident. We discussed preventative and palliative care of these lesions including supportive and accommodative shoegear, padding, prefabricated and custom molded accommodative orthoses, use of a pumice stone and lotions/creams daily. -Metatarsal pads and horseshoe pads were dispensed to the patient for offloading. - Recommend acutely stretching regimen to help lessen forefoot loading.  Return if symptoms worsen or fail to improve.         Ethan Saddler, DPM [facility] / [facility]                    right sub 4th met head  callus,

## 2023-08-07 ENCOUNTER — Ambulatory Visit: Payer: Medicaid Other | Admitting: Dermatology

## 2023-08-07 ENCOUNTER — Encounter: Payer: Self-pay | Admitting: Dermatology

## 2023-08-07 VITALS — BP 107/60 | HR 69

## 2023-08-07 DIAGNOSIS — Z5111 Encounter for antineoplastic chemotherapy: Secondary | ICD-10-CM | POA: Diagnosis not present

## 2023-08-07 DIAGNOSIS — W908XXA Exposure to other nonionizing radiation, initial encounter: Secondary | ICD-10-CM | POA: Diagnosis not present

## 2023-08-07 DIAGNOSIS — L570 Actinic keratosis: Secondary | ICD-10-CM | POA: Diagnosis not present

## 2023-08-07 DIAGNOSIS — L905 Scar conditions and fibrosis of skin: Secondary | ICD-10-CM

## 2023-08-07 DIAGNOSIS — Z85828 Personal history of other malignant neoplasm of skin: Secondary | ICD-10-CM

## 2023-08-07 DIAGNOSIS — C44310 Basal cell carcinoma of skin of unspecified parts of face: Secondary | ICD-10-CM

## 2023-08-07 MED ORDER — FLUOROURACIL 5 % EX CREA
TOPICAL_CREAM | CUTANEOUS | 2 refills | Status: DC
Start: 2023-08-07 — End: 2024-03-15

## 2023-08-07 NOTE — Patient Instructions (Addendum)
Post-Operative Scar Care: Education and Recommendations  Following your procedure, it's important to care for your scar to promote optimal healing and minimize its appearance. Proper post-operative care can help ensure that the scar heals well, and with time, it may become less noticeable. Below are key recommendations for scar care, including scar massage and the use of silicone scar gels or sheets.  1. General Scar Care Tips: -  Keep the wound clean and dry: Follow your healthcare provider's instructions for wound care, including cleaning the site and changing dressings as needed. -  Avoid sun exposure: Direct sunlight can darken scars and make them more noticeable. Once your wound has healed, apply sunscreen (SPF 30 or higher) to protect the scar from UV rays.  2. Scar Massage: - Start after healing: Wait until the scar has fully healed, with no scabs or open areas (usually 4-6 weeks after surgery). Your healthcare provider will give you specific guidance on when to begin. - Technique: Gently massage the scar in a circular motion for 5-10 minutes, 2-3 times per day. This helps to soften the tissue, reduce swelling, and improve the overall appearance of the scar. - Pressure: Apply gentle, firm pressure during the massage to break down the dense tissue that may form during healing. This helps to prevent the formation of keloids or hypertrophic scars. - Use lotion or ointment: Consider using a mild, fragrance-free lotion or vitamin E ointment to help lubricate the area during massage.  3. Silicone Scar Gels or Sheets: - When to start: Once your wound has healed completely, typically around 4-6 weeks, you can begin using silicone-based scar gels or sheets. These have been shown to improve scar appearance by hydrating the tissue and reducing inflammation. - How to use silicone gels: Apply a thin layer of the gel to the scar and allow it to dry before covering with clothing. You can use the  gel multiple times a day, depending on your provider's recommendation. - How to use silicone sheets: Cut the sheet to fit the size of your scar, and apply it directly to the healed scar. Wear it for 12-24 hours a day, and replace the sheet every few days as directed. - Benefits: Silicone helps reduce redness, flatten the scar, and improve its texture. Continued use over several months can lead to significant improvement in the appearance of the scar.  4. What to Expect: - Healing process: Scars generally take time to mature. The first few months may show redness or swelling, but this usually improves as healing progresses. - Long-term care: Scarring is a natural part of the healing process. While you cannot completely eliminate a scar, proper care can significantly improve its appearance over time. - Patience: It can take up to a year for a scar to fully mature, so it's important to be consistent with scar care and follow-up appointments with your provider.  5. When to Contact Your Healthcare Provider: - If you notice signs of infection (increased redness, warmth, drainage, or pain). - If your scar becomes unusually raised, itchy, or changes in color significantly. - If you have concerns about the appearance of your scar or experience unusual symptoms. - By following these guidelines, you can support your body's natural healing process and help ensure the best possible outcome for your scar. If you have any questions or concerns, please don't hesitate to contact our office.   Efudex Instructions 1. Apply a thin layer of Efudex cream to the specified sun  damaged area twice a day. The medication is to be applied to the entire  area, not just the keratoses (rough spots). Wash hands thoroughly after  application! 2. Use Efudex twice daily for 2 weeks to 4 weeks (depending on the  physician directions) or once a day for 4 weeks (depending on the  physician directions).  3. The skin will become red  and scaly. This is the appropriate response.  4. Once redness starts, you may begin using Hydrocortisone 1% Cream (or  other topical steroid provided by the physician) twice a day. This helps  relieve pain and will speed the healing process. It may be applied as soon  as 30 minutes after Efudex application. 5. For excessive reactions or if suspicious for an infection or allergic  reaction, please call to make an appointment at my office so that I may  evaluate your response to treatment. 6. The area may be washed as you normally would with soap and water. Do  not wash the face for at least three hours after Efudex  application. 7. Ladies: Stephen Miranda is permitted throughout the entire treatment period. Wait  30 minutes after Efudex use before applying makeup directly on top of the  medication. 8. Please read the instructional pamphlet provided by the [employer] so that you may familiarize yourself with the kinds of reactions  you may experience. 9. If you have had a biopsy on any area that you will use the medication on,  DO NOT start the Efudex until your results are given to you and  the medical assistant lets you know it is okay to start the treatment    Important Information   Due to recent changes in healthcare laws, you may see results of your pathology and/or laboratory studies on MyChart before the doctors have had a chance to review them. We understand that in some cases there may be results that are confusing or concerning to you. Please understand that not all results are received at the same time and often the doctors may need to interpret multiple results in order to provide you with the best plan of care or course of treatment. Therefore, we ask that you please give [place] 2 business days to thoroughly review all your results before contacting the office for clarification. Should we see a critical lab result, you will be contacted sooner.     If You Need Anything After Your  Visit   If you have any questions or concerns for your doctor, please call our main line at [phone]. If no one answers, please leave a voicemail as directed and we will return your call as soon as possible. Messages left after 4 pm will be answered the following business day.    You may also send [place] a message via MyChart. We typically respond to MyChart messages within 1-2 business days.  For prescription refills, please ask your pharmacy to contact our office. Our fax number is [phone].  If you have an urgent issue when the clinic is closed that cannot wait until the next business day, you can page your doctor at the number below.     Please note that while we do our best to be available for urgent issues outside of office hours, we are not available 24/7.    If you have an urgent issue and are unable to reach [place], you may choose to seek medical care at your doctor's office, retail clinic, urgent care center, or emergency room.  If you have a medical emergency, please immediately call 911 or go to the emergency department. In the event of inclement weather, please call our main line at [phone] for an update on the status of any delays or closures.  Dermatology Medication Tips: Please keep the boxes that topical medications come in in order to help keep track of the instructions about where and how to use these. Pharmacies typically print the medication instructions only on the boxes and not directly on the medication tubes.   If your medication is too expensive, please contact our office at [phone] or send [place] a message through MyChart.    We are unable to tell what your co-pay for medications will be in advance as this is different depending on your insurance coverage. However, we may be able to find a substitute medication at lower cost or fill out paperwork to get insurance to cover a needed medication.    If a prior authorization is required to get your medication  covered by your insurance company, please allow [place] 1-2 business days to complete this process.   Drug prices often vary depending on where the prescription is filled and some pharmacies may offer cheaper prices.   The website www.goodrx.com contains coupons for medications through different pharmacies. The prices here do not account for what the cost may be with help from insurance (it may be cheaper with your insurance), but the website can give you the price if you did not use any insurance.  - You can print the associated coupon and take it with your prescription to the pharmacy.  - You may also stop by our office during regular business hours and pick up a GoodRx coupon card.  - If you need your prescription sent electronically to a different pharmacy, notify our office through [facility] or by phone at [phone]

## 2023-08-07 NOTE — Progress Notes (Signed)
Follow Up Visit   Subjective  Stephen Miranda is a 53 y.o. male who presents for the following: follow up from Mohs surgery   The patient presents for follow up from Mohs surgery for a BCC on the left mandible, treated on 07/07/23, repaired with linear closure. The patient has been bandaging the wound as directed. The endorse the following concerns: none  The following portions of the chart were reviewed this encounter and updated as appropriate: medications, allergies, medical history  Review of Systems:  No other skin or systemic complaints except as noted in HPI or Assessment and Plan.  Objective  Well appearing patient in no apparent distress; mood and affect are within normal limits.  A full examination was performed including scalp, head, face . All findings within normal limits unless otherwise noted below.  Healing wound with mild erythema  Relevant physical exam findings are noted in the Assessment and Plan.    Assessment & Plan   ACTINIC KERATOSIS Exam: Erythematous thin papules/macules with gritty scale at the bilateral cheeks and jawline  Actinic keratoses are precancerous spots that appear secondary to cumulative UV radiation exposure/sun exposure over time. They are chronic with expected duration over 1 year. A portion of actinic keratoses will progress to squamous cell carcinoma of the skin. It is not possible to reliably predict which spots will progress to skin cancer and so treatment is recommended to prevent development of skin cancer.  Recommend daily broad spectrum sunscreen SPF 30+ to sun-exposed areas, reapply every 2 hours as needed.  Recommend staying in the shade or wearing long sleeves, sun glasses (UVA+UVB protection) and wide brim hats (4-inch brim around the entire circumference of the hat). Call for new or changing lesions.  Treatment Plan: Start 5-fluorouracil cream twice a day for 14 days to affected areas including bilateral cheeks and jawline.   Reviewed course of treatment and expected reaction.  Patient advised to expect inflammation and crusting and advised that erosions are possible.  Patient advised to be diligent with sun protection during and after treatment. Handout with details of how to apply medication and what to expect provided. Counseled to keep medication out of reach of children and pets.  Reviewed course of treatment and expected reaction.  Patient advised to expect inflammation and crusting and advised that erosions are possible.  Patient advised to be diligent with sun protection during and after treatment. Handout with details of how to apply medication and what to expect provided. Counseled to keep medication out of reach of children and pets.   Healing s/p Mohs for [facility], treated on left mandible, repaired with linear closure - Reassured that wound is healing well - No evidence of infection - No swelling, induration, purulence, dehiscence, or tenderness out of proportion to the clinical exam, see photo above - Discussed that scars take up to 12 months to mature from the date of surgery - Recommend SPF 30+ to scar daily to prevent purple color from UV exposure during scar maturation process - Discussed that erythema and raised appearance of scar will fade over the next 4-6 months - OK to start scar massage at 4-6 weeks post-op - Can consider silicone based products for scar healing starting at 6 weeks post-op    Return in 2 months (on 10/05/2023) for TBSE.  I, Tillie Fantasia, CMA, am acting as scribe for Gwenith Daily, MD.   Documentation: I have reviewed the above documentation for accuracy and completeness, and I agree with the above.  Gwenith Daily, MD

## 2023-09-04 ENCOUNTER — Ambulatory Visit: Payer: Medicaid Other | Admitting: Nurse Practitioner

## 2023-09-04 ENCOUNTER — Encounter: Payer: Self-pay | Admitting: Nurse Practitioner

## 2023-09-04 ENCOUNTER — Other Ambulatory Visit: Payer: Medicaid Other

## 2023-09-04 VITALS — BP 110/70 | HR 70 | Ht 74.0 in | Wt 206.5 lb

## 2023-09-04 DIAGNOSIS — Z1211 Encounter for screening for malignant neoplasm of colon: Secondary | ICD-10-CM

## 2023-09-04 DIAGNOSIS — K219 Gastro-esophageal reflux disease without esophagitis: Secondary | ICD-10-CM | POA: Diagnosis not present

## 2023-09-04 DIAGNOSIS — K7030 Alcoholic cirrhosis of liver without ascites: Secondary | ICD-10-CM | POA: Diagnosis not present

## 2023-09-04 DIAGNOSIS — D649 Anemia, unspecified: Secondary | ICD-10-CM

## 2023-09-04 LAB — IBC + FERRITIN
Ferritin: 39.7 ng/mL (ref 22.0–322.0)
Iron: 134 ug/dL (ref 42–165)
Saturation Ratios: 36.4 % (ref 20.0–50.0)
TIBC: 368.2 ug/dL (ref 250.0–450.0)
Transferrin: 263 mg/dL (ref 212.0–360.0)

## 2023-09-04 LAB — CBC WITH DIFFERENTIAL/PLATELET
Basophils Absolute: 0 10*3/uL (ref 0.0–0.1)
Basophils Relative: 0.6 % (ref 0.0–3.0)
Eosinophils Absolute: 0.1 10*3/uL (ref 0.0–0.7)
Eosinophils Relative: 2.1 % (ref 0.0–5.0)
HCT: 43.3 % (ref 39.0–52.0)
Hemoglobin: 14.7 g/dL (ref 13.0–17.0)
Lymphocytes Relative: 31.7 % (ref 12.0–46.0)
Lymphs Abs: 1.2 10*3/uL (ref 0.7–4.0)
MCHC: 34 g/dL (ref 30.0–36.0)
MCV: 98.3 fL (ref 78.0–100.0)
Monocytes Absolute: 0.4 10*3/uL (ref 0.1–1.0)
Monocytes Relative: 10.9 % (ref 3.0–12.0)
Neutro Abs: 2 10*3/uL (ref 1.4–7.7)
Neutrophils Relative %: 54.7 % (ref 43.0–77.0)
Platelets: 79 10*3/uL — ABNORMAL LOW (ref 150.0–400.0)
RBC: 4.4 Mil/uL (ref 4.22–5.81)
RDW: 14.1 % (ref 11.5–15.5)
WBC: 3.6 10*3/uL — ABNORMAL LOW (ref 4.0–10.5)

## 2023-09-04 LAB — COMPREHENSIVE METABOLIC PANEL
ALT: 20 U/L (ref 0–53)
AST: 33 U/L (ref 0–37)
Albumin: 3.6 g/dL (ref 3.5–5.2)
Alkaline Phosphatase: 139 U/L — ABNORMAL HIGH (ref 39–117)
BUN: 9 mg/dL (ref 6–23)
CO2: 28 meq/L (ref 19–32)
Calcium: 9 mg/dL (ref 8.4–10.5)
Chloride: 105 meq/L (ref 96–112)
Creatinine, Ser: 0.73 mg/dL (ref 0.40–1.50)
GFR: 104.97 mL/min (ref >60.00)
Glucose, Bld: 96 mg/dL (ref 70–99)
Potassium: 4.4 meq/L (ref 3.5–5.1)
Sodium: 139 meq/L (ref 135–145)
Total Bilirubin: 1.1 mg/dL (ref 0.2–1.2)
Total Protein: 6.5 g/dL (ref 6.0–8.3)

## 2023-09-04 LAB — PROTIME-INR
INR: 1.5 {ratio} — ABNORMAL HIGH (ref 0.8–1.0)
Prothrombin Time: 15.5 s — ABNORMAL HIGH (ref 9.6–13.1)

## 2023-09-04 NOTE — Progress Notes (Unsigned)
09/04/2023 Stephen Miranda [id number] 08-01-1970   Chief Complaint:  History of Present Illness: [place] Stephen Miranda is a 53 y.o. 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 He is known by Dr. Meridee Score.    03/12/2023 Patient no showed for this GI clinic visit. Patient may call back to reschedule as able. If he reschedules and no-shows to clinic again he will be dismissed from the [facility].  No nausea or vomiting. No heartburn or dysphagia. No upper or lower abdominal pain. He is passing two normal brown formed stools daily. No bloody or black stools.  No confusion. Gets tired a lot, hard to concentrate when tired. No pruritus. Urine a little darker.   Never had a screening colonoscopy   Paternal grandfafther had DILI      Latest Ref Rng & Units 06/24/2023   12:42 PM 02/04/2023   11:02 AM 01/07/2023    2:50 PM  CBC  WBC 3.4 - 10.8 x10E3/uL 4.6  3.4  7.3   Hemoglobin 13.0 - 17.7 g/dL 40.9  81.1  8.9   Hematocrit 37.5 - 51.0 % 41.2  33.3  27.1   Platelets 150 - 450 x10E3/uL 87  76  153        Latest Ref Rng & Units 06/24/2023   12:42 PM 02/04/2023   11:02 AM 01/07/2023    2:50 PM  CMP  Glucose 70 - 99 mg/dL 914  92  83   BUN 6 - 24 mg/dL 9  7  5    Creatinine 0.76 - 1.27 mg/dL 7.82  9.56  2.13   Sodium 134 - 144 mmol/L 140  141  141   Potassium 3.5 - 5.2 mmol/L 4.2  3.9  4.0   Chloride 96 - 106 mmol/L 106  104  106   CO2 20 - 29 mmol/L 21  21  23    Calcium 8.7 - 10.2 mg/dL 9.6  9.3  8.7   Total Protein 6.0 - 8.5 g/dL 6.2  6.4  5.6   Total Bilirubin 0.0 - 1.2 mg/dL 1.2  1.0  1.1   Alkaline Phos 44 - 121 IU/L 131  140  84   AST 0 - 40 IU/L 32  35  44   ALT 0 - 44 IU/L 21  17  30      MELD 3.0: 11 at 01/03/2023  4:38 AM MELD-Na: 10 at 01/03/2023  4:38 AM Calculated from: Serum Creatinine: 0.77 mg/dL (Using min of 1 mg/dL) at 0/86/5784  6:96 AM Serum Sodium: 137  mmol/L at 01/03/2023  4:38 AM Total Bilirubin: 0.9 mg/dL (Using min of 1 mg/dL) at 2/95/2841  3:24 AM Serum Albumin: 2.4 g/dL at 10/21/270  5:36 AM INR(ratio): 1.4 at 01/03/2023  4:38 AM Age at listing (hypothetical): 52 years Sex: Male at 01/03/2023  4:38 AM  IR BRTO 07/04/2022: IMPRESSION: Successful balloon occluded retrograde transvenous obliteration (BRTO) of the gastric varices as detailed above. Follow-up CTA (BRTO protocol) will be performed 48 hours to assess for complete GV occlusion.    CTA A/P w/contrast 07/05/2022: IMPRESSION: 1. Postprocedural changes status post coil assisted transvenous obliteration of gastroesophageal varices, no complicating features. 2. Similar appearing cirrhotic stigmata and trace ascites   CT A/P w/o contrast 07/10/22: IMPRESSION: 1. Increase in ascites, now with moderately large volume of ascites mainly in the lower abdomen and pelvis. 2. Marked cirrhosis and evidence of portal hypertension with changes of  occlusion of varices about the stomach and esophagus. 3. Umbilical hernia now contains a small knuckle of small bowel. No sign of obstruction. Would however correlate with any worsening abdominal symptoms about the umbilicus given that this may represent a Richter's type hernia which has a higher incidence of complication than a hernia which contains the entire bowel loop.   Liver doppler 07/18/2022: IMPRESSION: 1. Morphologic changes of cirrhosis with portal hypertension as evidenced by large volume ascites and mild splenomegaly. Patent portal venous system. 2. Large volume ascites, likely secondary to increased portal hypertension after recent transvenous obliteration of gastroesophageal varices.   EGD 05/20/2019:    EGD 07/03/2022:       Past Medical History:  Diagnosis Date   Basal cell carcinoma    Chronic insomnia 05/23/2016   Has stated in past since age 26 yo, but worse in recent years with restless legs.   Elevated  liver enzymes 04/24/2015   04/18/2015:  AST:  115     ALT:  90. Likely due to alcohol intake   Malignant melanoma of back (HCC) 02/07/2015   Left upper back:  Dr. Irene Limbo [facility]   Restless legs 05/23/2016   Seborrheic dermatitis 05/23/2016   Past Surgical History:  Procedure Laterality Date   ESOPHAGEAL BANDING  12/26/2022   Procedure: ESOPHAGEAL BANDING;  Surgeon: Imogene Burn, MD;  Location: [facility] ENDOSCOPY;  Service: Gastroenterology;;   ESOPHAGOGASTRODUODENOSCOPY N/A 07/03/2022   Procedure: ESOPHAGOGASTRODUODENOSCOPY (EGD);  Surgeon: Beverley Fiedler, MD;  Location: [facility] ENDOSCOPY;  Service: Gastroenterology;  Laterality: N/A;   ESOPHAGOGASTRODUODENOSCOPY (EGD) WITH PROPOFOL N/A 05/20/2019   Procedure: ESOPHAGOGASTRODUODENOSCOPY (EGD) WITH PROPOFOL;  Surgeon: Meridee Score Netty Starring., MD;  Location: [facility] ENDOSCOPY;  Service: Gastroenterology;  Laterality: N/A;   ESOPHAGOGASTRODUODENOSCOPY (EGD) WITH PROPOFOL N/A 12/26/2022   Procedure: ESOPHAGOGASTRODUODENOSCOPY (EGD) WITH PROPOFOL;  Surgeon: Imogene Burn, MD;  Location: [facility] ENDOSCOPY;  Service: Gastroenterology;  Laterality: N/A;   Excision Malignant Melanoma Left 02/07/2015   Left upper back   IR ANGIOGRAM SELECTIVE EACH ADDITIONAL VESSEL  07/04/2022   IR EMBO ART  VEN HEMORR LYMPH EXTRAV  INC GUIDE ROADMAPPING  07/04/2022   IR PARACENTESIS  07/18/2022   IR RADIOLOGIST EVAL & MGMT  02/12/2023   IR TIPS  01/03/2023   IR US GUIDE VASC ACCESS RIGHT  07/04/2022   IR US GUIDE VASC ACCESS RIGHT  01/03/2023   IR VENOGRAM RENAL UNI LEFT  07/04/2022   PARACENTESIS  07/18/2022   RADIOLOGY WITH ANESTHESIA N/A 07/04/2022   Procedure: IR WITH ANESTHESIA - TIPS;  Surgeon: Radiologist, Medication, MD;  Location: [facility] OR;  Service: Radiology;  Laterality: N/A;   RADIOLOGY WITH ANESTHESIA N/A 01/03/2023   Procedure: TIPS WITH ANESTHESIA;  Surgeon: Pernell Dupre, MD;  Location: [facility] OR;  Service: Radiology;  Laterality: N/A;       Current Medications, Allergies, Past Medical History, Past Surgical History, Family History and Social History were reviewed in [employer] record.   Review of Systems:   Constitutional: Negative for fever, sweats, chills or weight loss.  Respiratory: Negative for shortness of breath.   Cardiovascular: Negative for chest pain, palpitations and leg swelling.  Gastrointestinal: See HPI.  Musculoskeletal: Negative for back pain or muscle aches.  Neurological: Negative for dizziness, headaches or paresthesias.    Physical Exam: There were no vitals taken for this visit. General: in no acute distress. Head: Normocephalic and atraumatic. Eyes: No scleral icterus. Conjunctiva pink . Ears: Normal auditory acuity. Mouth: Dentition intact.  No ulcers or lesions.  Lungs: Clear throughout to auscultation. Heart: Regular rate and rhythm, no murmur. Abdomen: Soft, nontender and nondistended. No masses or hepatomegaly. Normal bowel sounds x 4 quadrants.  Rectal: Deferred.  Musculoskeletal: Symmetrical with no gross deformities. Extremities: No edema. Neurological: Alert oriented x 4. No focal deficits.  Psychological: Alert and cooperative. Normal mood and affect  Assessment and Recommendations: ***

## 2023-09-04 NOTE — Patient Instructions (Addendum)
It has been recommended to you by your physician that you have a(n) Colonoscopy completed. Per your request, we did not schedule the procedure(s) today. Please contact our office at [phone] should you decide to have the procedure completed. You will be scheduled for a pre-visit and procedure at that time.  Your provider has requested that you go to the basement level for lab work before leaving today. Press "B" on the elevator. The lab is located at the first door on the left as you exit the elevator.  You will be contacted by [facility] Scheduling in the next 2 days to arrange a Ultrasound.  The number on your caller ID will be [phone], please answer when they call.  If you have not heard from them in 2 days please call [phone] to schedule.     2 gram low sodium diet: increase protein in diet  Continue Furosemide, Spironolactone, Lactulose & Panotprazole as previously prescribed.  Due to recent changes in healthcare laws, you may see the results of your imaging and laboratory studies on MyChart before your provider has had a chance to review them.  We understand that in some cases there may be results that are confusing or concerning to you. Not all laboratory results come back in the same time frame and the provider may be waiting for multiple results in order to interpret others.  Please give [place] 48 hours in order for your provider to thoroughly review all the results before contacting the office for clarification of your results.   Thank you for trusting me with your gastrointestinal care!   Alcide Evener, CRNP

## 2023-09-05 NOTE — Progress Notes (Signed)
Attending Physician's Attestation   I have reviewed the chart.   I agree with the Advanced Practitioner's note, impression, and recommendations with any updates as below. I think an EGD within the year of last endoscopy is reasonable with the history of likely Barrett's but not ability to biopsy due to issues of variceal bleeding.  Thus would consider endoscopy this summer (at least June 2025).  Agreed that in setting of never having screening colonoscopy, it would make sense to do colonoscopy at same time.  Will need to see his iron indices, because if he has evidence of iron deficiency still occurring then colonoscopy definitely recommended.   Corliss Parish, MD [facility] Advanced Endoscopy Office # [phone]

## 2023-09-08 LAB — AFP TUMOR MARKER: AFP-Tumor Marker: 4.1 ng/mL (ref <6.1)

## 2023-09-09 ENCOUNTER — Other Ambulatory Visit: Payer: Self-pay | Admitting: Radiology

## 2023-09-09 DIAGNOSIS — I8510 Secondary esophageal varices without bleeding: Secondary | ICD-10-CM

## 2023-09-10 ENCOUNTER — Encounter: Payer: Self-pay | Admitting: Gastroenterology

## 2023-09-15 NOTE — Progress Notes (Signed)
Iron levels were normal

## 2023-09-15 NOTE — Progress Notes (Signed)
 See 09/04/2023 lab note. Patient is scheduled for a colonoscopy 10/08/2023, recommend rescheduling colonoscopy + EGD 12/2023 if patient willing to do so.

## 2023-09-24 ENCOUNTER — Ambulatory Visit: Payer: Medicaid Other

## 2023-09-24 VITALS — Ht 74.0 in | Wt 208.0 lb

## 2023-09-24 DIAGNOSIS — Z1211 Encounter for screening for malignant neoplasm of colon: Secondary | ICD-10-CM

## 2023-09-24 MED ORDER — SUFLAVE 178.7 G PO SOLR: 1.0000 | ORAL | 0 refills | Status: DC

## 2023-09-24 NOTE — Progress Notes (Signed)

## 2023-10-07 ENCOUNTER — Telehealth: Payer: Self-pay

## 2023-10-07 DIAGNOSIS — K7030 Alcoholic cirrhosis of liver without ascites: Secondary | ICD-10-CM

## 2023-10-07 NOTE — Telephone Encounter (Signed)
Thanks Patty. GM 

## 2023-10-07 NOTE — Telephone Encounter (Signed)
-----   Message from [facility] sent at 10/07/2023  6:10 AM EDT ----- Regarding: Add on EGD With this patients history of cirrhosis and prior TIPs and BRTO it would make sense for [place] to try and get approval for an EGD as well if he is coming in for Colonoscopy tomorrow. Would try to get prior authorization for EGD today if possible. We can let patient know that we would like to consider an EGD, but if he defers on it then we won't perform, but at least we have prior authorization to perform. Thanks GM

## 2023-10-07 NOTE — Telephone Encounter (Signed)
 Referral sent to the pre cert team to get approval for EGD.

## 2023-10-07 NOTE — Telephone Encounter (Signed)
 FYI Dr Meridee Score  Spoke with the pt and made him aware that Dr Meridee Score is recommending EGD as well as Colon. He has agreed and referral entered for insurance approval.

## 2023-10-08 ENCOUNTER — Encounter: Payer: Self-pay | Admitting: Gastroenterology

## 2023-10-08 ENCOUNTER — Ambulatory Visit: Payer: Medicaid Other | Admitting: Gastroenterology

## 2023-10-08 VITALS — BP 115/68 | HR 68 | Temp 99.0°F | Resp 12

## 2023-10-08 DIAGNOSIS — D120 Benign neoplasm of cecum: Secondary | ICD-10-CM

## 2023-10-08 DIAGNOSIS — K7030 Alcoholic cirrhosis of liver without ascites: Secondary | ICD-10-CM

## 2023-10-08 DIAGNOSIS — K449 Diaphragmatic hernia without obstruction or gangrene: Secondary | ICD-10-CM | POA: Diagnosis not present

## 2023-10-08 DIAGNOSIS — K641 Second degree hemorrhoids: Secondary | ICD-10-CM

## 2023-10-08 DIAGNOSIS — K635 Polyp of colon: Secondary | ICD-10-CM

## 2023-10-08 DIAGNOSIS — F1091 Alcohol use, unspecified, in remission: Secondary | ICD-10-CM

## 2023-10-08 DIAGNOSIS — D127 Benign neoplasm of rectosigmoid junction: Secondary | ICD-10-CM | POA: Diagnosis not present

## 2023-10-08 DIAGNOSIS — K2270 Barrett's esophagus without dysplasia: Secondary | ICD-10-CM

## 2023-10-08 DIAGNOSIS — K2950 Unspecified chronic gastritis without bleeding: Secondary | ICD-10-CM

## 2023-10-08 DIAGNOSIS — K3189 Other diseases of stomach and duodenum: Secondary | ICD-10-CM | POA: Diagnosis not present

## 2023-10-08 DIAGNOSIS — D124 Benign neoplasm of descending colon: Secondary | ICD-10-CM

## 2023-10-08 DIAGNOSIS — K2970 Gastritis, unspecified, without bleeding: Secondary | ICD-10-CM

## 2023-10-08 DIAGNOSIS — K22719 Barrett's esophagus with dysplasia, unspecified: Secondary | ICD-10-CM

## 2023-10-08 DIAGNOSIS — Z1211 Encounter for screening for malignant neoplasm of colon: Secondary | ICD-10-CM | POA: Diagnosis not present

## 2023-10-08 DIAGNOSIS — K31A14 Gastric intestinal metaplasia without dysplasia, involving the cardia: Secondary | ICD-10-CM

## 2023-10-08 DIAGNOSIS — D649 Anemia, unspecified: Secondary | ICD-10-CM

## 2023-10-08 MED ORDER — SODIUM CHLORIDE 0.9 % IV SOLN: 500.0000 mL | Freq: Once | INTRAVENOUS | Status: DC

## 2023-10-08 NOTE — Progress Notes (Unsigned)
 A/o x 3, VSS, gd SR's, pleased with anesthesia, report to RN

## 2023-10-08 NOTE — Progress Notes (Unsigned)
 Pt complaining of stiffness in both wrists and thumbs in recovery.  Advised him to monitor over night and to call with any worsening pain.

## 2023-10-08 NOTE — Op Note (Signed)
 [facility] Patient Name: Stephen Miranda Procedure Date: 10/08/2023 3:54 PM MRN: [id number] Endoscopist: Corliss Parish , MD, [id number] Age: 53 Referring MD:  Date of Birth: 1970-11-09 Gender: Male Account #: [id number] Procedure:                Colonoscopy Indications:              Screening for colorectal malignant neoplasm, This                            is the patient's first colonoscopy Medicines:                Monitored Anesthesia Care Procedure:                Pre-Anesthesia Assessment:                           - Prior to the procedure, a History and Physical                            was performed, and patient medications and                            allergies were reviewed. The patient's tolerance of                            previous anesthesia was also reviewed. The risks                            and benefits of the procedure and the sedation                            options and risks were discussed with the patient.                            All questions were answered, and informed consent                            was obtained. Prior Anticoagulants: The patient has                            taken no anticoagulant or antiplatelet agents. ASA                            Grade Assessment: III - A patient with severe                            systemic disease. After reviewing the risks and                            benefits, the patient was deemed in satisfactory                            condition to undergo the procedure.  After obtaining informed consent, the colonoscope                            was passed under direct vision. Throughout the                            procedure, the patient's blood pressure, pulse, and                            oxygen saturations were monitored continuously. The                            Olympus Scope SN [id number] was introduced through the                            anus and advanced to  the the cecum, identified by                            appendiceal orifice and ileocecal valve. The                            colonoscopy was somewhat difficult due to                            significant looping. Successful completion of the                            procedure was aided by changing the patient's                            position, using manual pressure, straightening and                            shortening the scope to obtain bowel loop reduction                            and using scope torsion. The patient tolerated the                            procedure. The quality of the bowel preparation was                            adequate. The ileocecal valve, appendiceal orifice,                            and rectum were photographed. Scope In: 4:26:33 PM Scope Out: 4:54:35 PM Total Procedure Duration: 0 hours 28 minutes 2 seconds  Findings:                 The digital rectal exam findings include                            hemorrhoids. Pertinent negatives include no  palpable rectal lesions.                           The colon (entire examined portion) revealed                            significantly excessive looping.                           Two sessile polyps were found in the descending                            colon and cecum. The polyps were 4 to 6 mm in size.                            These polyps were removed with a cold snare.                            Resection and retrieval were complete.                           A 20 mm polyp was found in the recto-sigmoid colon.                            The polyp was pedunculated. Preparations were made                            for mucosal resection. Demarcation of the lesion                            was performed with high-definition white light and                            narrow band imaging to clearly identify the                            boundaries of the lesion. Saline  was injected to                            raise the lesion. Snare mucosal resection was                            performed. Resection and retrieval were complete.                            Resected tissue margins were examined and clear of                            polyp tissue. To prevent bleeding after mucosal                            resection, three hemostatic clips were successfully  placed (MR conditional). There was no bleeding                            during, or at the end, of the procedure.                           Normal mucosa was found in the entire colon                            otherwise.                           Non-bleeding non-thrombosed internal hemorrhoids                            were found during retroflexion, during perianal                            exam and during digital exam. The hemorrhoids were                            Grade II (internal hemorrhoids that prolapse but                            reduce spontaneously). Complications:            No immediate complications. Estimated Blood Loss:     Estimated blood loss was minimal. Impression:               - Hemorrhoids found on digital rectal exam.                           - There was significant looping of the colon.                           - Two 4 to 6 mm polyps in the descending colon and                            in the cecum, removed with a cold snare. Resected                            and retrieved.                           - One 20 mm polyp at the recto-sigmoid colon,                            removed with mucosal resection. Resected and                            retrieved. Clips (MR conditional) were placed.                           - Normal mucosa in the entire examined colon  otherwise.                           - Non-bleeding non-thrombosed internal hemorrhoids. Recommendation:           - The patient will be observed  post-procedure,                            until all discharge criteria are met.                           - Discharge patient to home.                           - Patient has a contact number available for                            emergencies. The signs and symptoms of potential                            delayed complications were discussed with the                            patient. Return to normal activities tomorrow.                            Written discharge instructions were provided to the                            patient.                           - High fiber diet.                           - Use FiberCon 1-2 tablets PO daily.                           - Continue present medications.                           - Await pathology results.                           - Repeat colonoscopy in 3 years for surveillance.                           - The findings and recommendations were discussed                            with the patient.                           - The findings and recommendations were discussed                            with the patient's family. Corliss Parish, MD 10/08/2023 5:06:36  PM

## 2023-10-08 NOTE — Patient Instructions (Addendum)
 - 3 polyps removed and sent to pathology  (clip card given) - hemorrhoids                  - High fiber diet. - Use FiberCon 1-2 tablets PO daily. - Continue present medications. - Await pathology results - Repeat colonoscopy in 3 years for surveillance. - Repeat upper endoscopy in 3 years for    surveillance of Barrett's if no dysplasia is found.    Would also do for variceal screening.   YOU HAD AN ENDOSCOPIC PROCEDURE TODAY AT THE [facility]:   Refer to the procedure report that was given to you for any specific questions about what was found during the examination.  If the procedure report does not answer your questions, please call your gastroenterologist to clarify.  If you requested that your care partner not be given the details of your procedure findings, then the procedure report has been included in a sealed envelope for you to review at your convenience later.  YOU SHOULD EXPECT: Some feelings of bloating in the abdomen. Passage of more gas than usual.  Walking can help get rid of the air that was put into your GI tract during the procedure and reduce the bloating. If you had a lower endoscopy (such as a colonoscopy or flexible sigmoidoscopy) you may notice spotting of blood in your stool or on the toilet paper. If you underwent a bowel prep for your procedure, you may not have a normal bowel movement for a few days.  Please Note:  You might notice some irritation and congestion in your nose or some drainage.  This is from the oxygen used during your procedure.  There is no need for concern and it should clear up in a day or so.  SYMPTOMS TO REPORT IMMEDIATELY:  Following lower endoscopy (colonoscopy or flexible sigmoidoscopy):  Excessive amounts of blood in the stool  Significant tenderness or worsening of abdominal pains  Swelling of the abdomen that is new, acute  Fever of 100F or higher  Following upper endoscopy (EGD)  Vomiting of blood or coffee ground  material  New chest pain or pain under the shoulder blades  Painful or persistently difficult swallowing  New shortness of breath  Fever of 100F or higher  Black, tarry-looking stools  For urgent or emergent issues, a gastroenterologist can be reached at any hour by calling [phone]. Do not use MyChart messaging for urgent concerns.    DIET:  We do recommend a small meal at first, but then you may proceed to your regular diet.  Drink plenty of fluids but you should avoid alcoholic beverages for 24 hours.  ACTIVITY:  You should plan to take it easy for the rest of today and you should NOT DRIVE or use heavy machinery until tomorrow (because of the sedation medicines used during the test).    FOLLOW UP: Our staff will call the number listed on your records the next business day following your procedure.  We will call around 7:15- 8:00 am to check on you and address any questions or concerns that you may have regarding the information given to you following your procedure. If we do not reach you, we will leave a message.     If any biopsies were taken you will be contacted by phone or by letter within the next 1-3 weeks.  Please call us at [phone] if you have not heard about the biopsies in 3 weeks.  SIGNATURES/CONFIDENTIALITY: You and/or your care partner have signed paperwork which will be entered into your electronic medical record.  These signatures attest to the fact that that the information above on your After Visit Summary has been reviewed and is understood.  Full responsibility of the confidentiality of this discharge information lies with you and/or your care-partner.

## 2023-10-08 NOTE — Op Note (Signed)
 [facility] Patient Name: Stephen Miranda Procedure Date: 10/08/2023 4:01 PM MRN: [id number] Endoscopist: Corliss Parish , MD, [id number] Age: 53 Referring MD:  Date of Birth: [birth year] Gender: Male Account #: [id number] Procedure:                Upper GI endoscopy Indications:              Cirrhosis with suspected esophageal varices,                            Exclusion of gastric varices Medicines:                Monitored Anesthesia Care Procedure:                Pre-Anesthesia Assessment:                           - Prior to the procedure, a History and Physical                            was performed, and patient medications and                            allergies were reviewed. The patient's tolerance of                            previous anesthesia was also reviewed. The risks                            and benefits of the procedure and the sedation                            options and risks were discussed with the patient.                            All questions were answered, and informed consent                            was obtained. Prior Anticoagulants: The patient has                            taken no anticoagulant or antiplatelet agents. ASA                            Grade Assessment: III - A patient with severe                            systemic disease. After reviewing the risks and                            benefits, the patient was deemed in satisfactory                            condition to undergo the procedure.  After obtaining informed consent, the endoscope was                            passed under direct vision. Throughout the                            procedure, the patient's blood pressure, pulse, and                            oxygen saturations were monitored continuously. The                            Olympus Scope [id number] was introduced through the                            mouth, and advanced to the  second part of duodenum.                            The upper GI endoscopy was accomplished without                            difficulty. The patient tolerated the procedure. Scope In: Scope Out: Findings:                 No gross lesions were noted in the proximal                            esophagus and in the mid esophagus.                           The esophagus and gastroesophageal junction were                            examined with white light and narrow band imaging                            (NBI) from a forward view and retroflexed position.                            There were esophageal mucosal changes suggestive of                            long-segment Barrett's esophagus. These changes                            involved the mucosa at the upper extent of the                            gastric folds (40 cm from the incisors) extending                            to the Z-line. Circumferential salmon-colored  mucosa was present from 29 to 40 cm. The maximum                            longitudinal extent of these esophageal mucosal                            changes was 11 cm in length. Mucosa was biopsied                            with a cold forceps for histology randomly at                            intervals of 3 cm. A total of 4 specimen bottles                            were sent to pathology.                           There is no endoscopic evidence of varices in the                            entire esophagus.                           A 3 cm hiatal hernia was present.                           Patchy mildly erythematous mucosa without bleeding                            was found in the entire examined stomach. Biopsies                            were taken with a cold forceps for histology and                            Helicobacter pylori testing.                           There is no endoscopic evidence of varices in the                             entire examined stomach.                           No gross lesions were noted in the duodenal bulb,                            in the first portion of the duodenum and in the                            second portion of the duodenum. Complications:            No immediate complications. Estimated Blood Loss:  Estimated blood loss was minimal. Impression:               - No gross lesions in the proximal esophagus and in                            the mid esophagus.                           - Esophageal mucosal changes suggestive of                            long-segment Barrett's esophagus found distally.                            Biopsied.                           - No esophageal varices noted.                           - 3 cm hiatal hernia.                           - Erythematous mucosa in the stomach. Biopsied.                           - No gastric varices noted.                           - No gross lesions in the duodenal bulb, in the                            first portion of the duodenum and in the second                            portion of the duodenum. Recommendation:           - Proceed to scheduled colonoscopy.                           - Continue present medications.                           - Await pathology results.                           - Repeat upper endoscopy in 3 years for                            surveillance of Barrett's if no dysplasia is found.                            Would also do for variceal screening.                           - The findings and recommendations were discussed  with the patient.                           - The findings and recommendations were discussed                            with the patient's family. Corliss Parish, MD 10/08/2023 5:03:05 PM

## 2023-10-08 NOTE — Progress Notes (Unsigned)
 Called to room to assist during endoscopic procedure.  Patient ID and intended procedure confirmed with present staff. Received instructions for my participation in the procedure from the performing physician.

## 2023-10-08 NOTE — Progress Notes (Unsigned)
 GASTROENTEROLOGY PROCEDURE H&P NOTE   Primary Care Physician: Julieanne Manson, MD  HPI: Stephen Miranda is a 53 y.o. 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  Past Medical History:  Diagnosis Date   Basal cell carcinoma    Chronic insomnia 05/23/2016   Has stated in past since age 89 yo, but worse in recent years with restless legs.   Elevated liver enzymes 04/24/2015   04/18/2015:  AST:  115     ALT:  90. Likely due to alcohol intake   Malignant melanoma of back (HCC) 02/07/2015   Left upper back:  Dr. Irene Limbo [facility]   Restless legs 05/23/2016   Seborrheic dermatitis 05/23/2016   Past Surgical History:  Procedure Laterality Date   ESOPHAGEAL BANDING  12/26/2022   Procedure: ESOPHAGEAL BANDING;  Surgeon: Imogene Burn, MD;  Location: [facility] ENDOSCOPY;  Service: Gastroenterology;;   ESOPHAGOGASTRODUODENOSCOPY N/A 07/03/2022   Procedure: ESOPHAGOGASTRODUODENOSCOPY (EGD);  Surgeon: Beverley Fiedler, MD;  Location: [facility] ENDOSCOPY;  Service: Gastroenterology;  Laterality: N/A;   ESOPHAGOGASTRODUODENOSCOPY (EGD) WITH PROPOFOL N/A 05/20/2019   Procedure: ESOPHAGOGASTRODUODENOSCOPY (EGD) WITH PROPOFOL;  Surgeon: Meridee Score Netty Starring., MD;  Location: [facility] ENDOSCOPY;  Service: Gastroenterology;  Laterality: N/A;   ESOPHAGOGASTRODUODENOSCOPY (EGD) WITH PROPOFOL N/A 12/26/2022   Procedure: ESOPHAGOGASTRODUODENOSCOPY (EGD) WITH PROPOFOL;  Surgeon: Imogene Burn, MD;  Location: [facility] ENDOSCOPY;  Service: Gastroenterology;  Laterality: N/A;   Excision Malignant Melanoma Left 02/07/2015   Left upper back   IR ANGIOGRAM SELECTIVE EACH ADDITIONAL VESSEL  07/04/2022   IR EMBO ART  VEN HEMORR LYMPH EXTRAV  INC GUIDE ROADMAPPING  07/04/2022   IR PARACENTESIS  07/18/2022   IR RADIOLOGIST EVAL & MGMT  02/12/2023   IR TIPS  01/03/2023   IR US GUIDE VASC ACCESS RIGHT  07/04/2022   IR US GUIDE VASC ACCESS  RIGHT  01/03/2023   IR VENOGRAM RENAL UNI LEFT  07/04/2022   PARACENTESIS  07/18/2022   RADIOLOGY WITH ANESTHESIA N/A 07/04/2022   Procedure: IR WITH ANESTHESIA - TIPS;  Surgeon: Radiologist, Medication, MD;  Location: [facility] OR;  Service: Radiology;  Laterality: N/A;   RADIOLOGY WITH ANESTHESIA N/A 01/03/2023   Procedure: TIPS WITH ANESTHESIA;  Surgeon: Pernell Dupre, MD;  Location: [facility] OR;  Service: Radiology;  Laterality: N/A;   Current Outpatient Medications  Medication Sig Dispense Refill   escitalopram (LEXAPRO) 5 MG tablet TAKE 1 TABLET BY MOUTH DAILY 30 tablet 11   ferrous gluconate (FERGON) 324 MG tablet Take 324 mg by mouth daily with breakfast.     fluorouracil (EFUDEX) 5 % cream Apply to cheeks and jawline for 2 weeks (Patient not taking: Reported on 09/24/2023) 40 g 2   furosemide (LASIX) 20 MG tablet TAKE 1 TABLET BY MOUTH DAILY in THE morning WITH spirnolactone 30 tablet 11   Lactulose 20 GM/30ML SOLN 30 ml by mouth 3 times daily 2700 mL 4   Multiple Vitamin (MULTIVITAMIN WITH MINERALS) TABS tablet Take 1 tablet by mouth daily. 30 tablet 3   pantoprazole (PROTONIX) 40 MG tablet TAKE 1 TABLET BY MOUTH 2 TIMES DAILY 60 tablet 11   PEG 3350-KCl-NaCl-NaSulf-MgSul (SUFLAVE) 178.7 g SOLR Take 1 kit by mouth as directed. 1 each 0   spironolactone (ALDACTONE) 50 MG tablet TAKE 1 TABLET BY MOUTH IN THE MORNING WITH furosemide 30 tablet 11   tamsulosin (FLOMAX) 0.4 MG CAPS capsule TAKE 1 CAPSULE BY MOUTH DAILY AFTER SUPPER  30 capsule 11   traMADol (ULTRAM) 50 MG tablet Take 1 tablet (50 mg total) by mouth every 6 (six) hours as needed for up to 8 doses. (Patient not taking: Reported on 09/24/2023) 8 tablet 0   Current Facility-Administered Medications  Medication Dose Route Frequency Provider Last Rate Last Admin   0.9 %  sodium chloride infusion  500 mL Intravenous Once Mansouraty, Netty Starring., MD        Current Outpatient Medications:    escitalopram (LEXAPRO) 5 MG tablet, TAKE 1 TABLET BY  MOUTH DAILY, Disp: 30 tablet, Rfl: 11   ferrous gluconate (FERGON) 324 MG tablet, Take 324 mg by mouth daily with breakfast., Disp: , Rfl:    fluorouracil (EFUDEX) 5 % cream, Apply to cheeks and jawline for 2 weeks (Patient not taking: Reported on 09/24/2023), Disp: 40 g, Rfl: 2   furosemide (LASIX) 20 MG tablet, TAKE 1 TABLET BY MOUTH DAILY in THE morning WITH spirnolactone, Disp: 30 tablet, Rfl: 11   Lactulose 20 GM/30ML SOLN, 30 ml by mouth 3 times daily, Disp: 2700 mL, Rfl: 4   Multiple Vitamin (MULTIVITAMIN WITH MINERALS) TABS tablet, Take 1 tablet by mouth daily., Disp: 30 tablet, Rfl: 3   pantoprazole (PROTONIX) 40 MG tablet, TAKE 1 TABLET BY MOUTH 2 TIMES DAILY, Disp: 60 tablet, Rfl: 11   PEG 3350-KCl-NaCl-NaSulf-MgSul (SUFLAVE) 178.7 g SOLR, Take 1 kit by mouth as directed., Disp: 1 each, Rfl: 0   spironolactone (ALDACTONE) 50 MG tablet, TAKE 1 TABLET BY MOUTH IN THE MORNING WITH furosemide, Disp: 30 tablet, Rfl: 11   tamsulosin (FLOMAX) 0.4 MG CAPS capsule, TAKE 1 CAPSULE BY MOUTH DAILY AFTER SUPPER, Disp: 30 capsule, Rfl: 11   traMADol (ULTRAM) 50 MG tablet, Take 1 tablet (50 mg total) by mouth every 6 (six) hours as needed for up to 8 doses. (Patient not taking: Reported on 09/24/2023), Disp: 8 tablet, Rfl: 0  Current Facility-Administered Medications:    0.9 %  sodium chloride infusion, 500 mL, Intravenous, Once, Mansouraty, Netty Starring., MD No Known Allergies Family History  Problem Relation Age of Onset   Fibromyalgia Mother    Colon cancer Neg Hx    Rectal cancer Neg Hx    Stomach cancer Neg Hx    Social History   Socioeconomic History   Marital status: Single    Spouse name: Not on file   Number of children: 0   Years of education: one class away from [employer] in Therapist, music   Highest education level: Not on file  Occupational History   Occupation: Works at [employer]: 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  Tobacco Use   Smoking status: Every Day     Current packs/day: 0.50    Average packs/day: 0.5 packs/day for 20.0 years (10.0 ttl pk-yrs)    Types: Cigarettes    Passive exposure: Never   Smokeless tobacco: Never   Tobacco comments:    prescribed Chantix in past, but did not take  Vaping Use   Vaping status: Never Used  Substance and Sexual Activity   Alcohol use: Not Currently    Alcohol/week: 4.0 standard drinks of alcohol    Types: 2 Cans of beer, 2 Shots of liquor per week    Comment: Nightly   Drug use: No   Sexual activity: Not on file  Other Topics Concern   Not on file  Social History Narrative   Currently lives with parents   Social Drivers of Health  Financial Resource Strain: Not on file  Food Insecurity: Not on file  Transportation Needs: Not on file  Physical Activity: Not on file  Stress: Not on file  Social Connections: Not on file  Intimate Partner Violence: Not on file    Physical Exam: Today's Vitals   10/08/23 1505  BP: (!) 104/49  Pulse: 77  Temp: 99 F (37.2 C)  SpO2: 96%   There is no height or weight on file to calculate BMI. GEN: NAD EYE: Sclerae anicteric ENT: MMM CV: Non-tachycardic GI: Soft, NT/ND NEURO:  Alert & Oriented x 3  Lab Results: No results for input(s): "WBC", "HGB", "HCT", "PLT" in the last 72 hours. BMET No results for input(s): "NA", "K", "CL", "CO2", "GLUCOSE", "BUN", "CREATININE", "CALCIUM" in the last 72 hours. LFT No results for input(s): "PROT", "ALBUMIN", "AST", "ALT", "ALKPHOS", "BILITOT", "BILIDIR", "IBILI" in the last 72 hours. PT/INR No results for input(s): "LABPROT", "INR" in the last 72 hours.   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  The risks and benefits of endoscopic evaluation/treatment were discussed with the patient and/or family; these include but are not limited to the risk of perforation, infection, bleeding,  missed lesions, lack of diagnosis, severe illness requiring hospitalization, as well as anesthesia and sedation related illnesses.  The patient's history has been reviewed, patient examined, no change in status, and deemed stable for procedure.  The patient and/or family is agreeable to proceed.    Corliss Parish, MD [facility] Office # [phone]

## 2023-10-09 ENCOUNTER — Telehealth: Payer: Self-pay | Admitting: *Deleted

## 2023-10-09 NOTE — Telephone Encounter (Signed)
 This is out of proportion to anything we would expect. I agree with trying Tylenol for a period of time. Will have my team reach out to patient on Friday for followup.  Patty, Please see where patient stands on Friday with regards to his symptomatology.  GM

## 2023-10-09 NOTE — Telephone Encounter (Signed)
  Follow up Call-     10/08/2023    3:06 PM  Call back number  Post procedure Call Back phone  # [phone]  Permission to leave phone message Yes     Patient questions:  Do you have a fever, pain , or abdominal swelling? No. Pain Score  6 *-aching pain in joints including hands, ankles, shoulders and knees that pt reports he noticed immediately after waking up from anesthesia. States he is unable to zip up his jacket this morning d/t the pain in his hands. Reports that the pain is diminished when he is not moving and it lessens slightly in his ankles when walking around. Has not taken any medication for this, recommended he could try Tylenol for the pain. Did instruct pt to avoid NSAIDs d/t large polyp removed and risk of bleeding. Also encouraged pt to increase fluid intake to help replenish from possible dehydration from the bowel prep. Instructed pt that MD will be made aware and a nurse will call back if any further recommendations. Also instructed pt to seek care at an urgent care or ED if pain worsens or he develops an inability to move. Pt verbalized understanding and is agreeable to plan of care.   Have you tolerated food without any problems? Yes.    Have you been able to return to your normal activities? No.  Do you have any questions about your discharge instructions: Diet   No. Medications  No. Follow up visit  No.  Do you have questions or concerns about your Care? No.  Actions: * If pain score is 4 or above: Physician/ provider Notified : Corliss Parish, MD. Date 10/09/2023  at Time 7:27 AM

## 2023-10-10 NOTE — Telephone Encounter (Signed)
 Thanks Patty. Glad to hear. GM

## 2023-10-10 NOTE — Telephone Encounter (Signed)
 FYI the pt states that he is completley back to normal and has no symptoms.  He will call if he has any other concerns.

## 2023-10-13 LAB — SURGICAL PATHOLOGY

## 2023-10-14 ENCOUNTER — Encounter: Payer: Self-pay | Admitting: Gastroenterology

## 2023-10-24 ENCOUNTER — Other Ambulatory Visit: Payer: Medicaid Other

## 2023-10-24 DIAGNOSIS — Z0000 Encounter for general adult medical examination without abnormal findings: Secondary | ICD-10-CM

## 2023-10-25 LAB — COMPREHENSIVE METABOLIC PANEL WITH GFR
ALT: 22 IU/L (ref 0–44)
AST: 33 IU/L (ref 0–40)
Albumin: 3.7 g/dL — ABNORMAL LOW (ref 3.8–4.9)
Alkaline Phosphatase: 170 IU/L — ABNORMAL HIGH (ref 44–121)
BUN/Creatinine Ratio: 10 (ref 9–20)
BUN: 7 mg/dL (ref 6–24)
Bilirubin Total: 1 mg/dL (ref 0.0–1.2)
CO2: 20 mmol/L (ref 20–29)
Calcium: 9.1 mg/dL (ref 8.7–10.2)
Chloride: 105 mmol/L (ref 96–106)
Creatinine, Ser: 0.68 mg/dL — ABNORMAL LOW (ref 0.76–1.27)
Globulin, Total: 2.6 g/dL (ref 1.5–4.5)
Glucose: 85 mg/dL (ref 70–99)
Potassium: 4 mmol/L (ref 3.5–5.2)
Sodium: 139 mmol/L (ref 134–144)
Total Protein: 6.3 g/dL (ref 6.0–8.5)
eGFR: 112 mL/min/{1.73_m2} (ref >59)

## 2023-10-25 LAB — CBC WITH DIFFERENTIAL/PLATELET
Basophils Absolute: 0 10*3/uL (ref 0.0–0.2)
Basos: 1 %
EOS (ABSOLUTE): 0.1 10*3/uL (ref 0.0–0.4)
Eos: 1 %
Hematocrit: 44.3 % (ref 37.5–51.0)
Hemoglobin: 15.2 g/dL (ref 13.0–17.7)
Immature Grans (Abs): 0 10*3/uL (ref 0.0–0.1)
Immature Granulocytes: 0 %
Lymphocytes Absolute: 1.4 10*3/uL (ref 0.7–3.1)
Lymphs: 30 %
MCH: 33.3 pg — ABNORMAL HIGH (ref 26.6–33.0)
MCHC: 34.3 g/dL (ref 31.5–35.7)
MCV: 97 fL (ref 79–97)
Monocytes Absolute: 0.4 10*3/uL (ref 0.1–0.9)
Monocytes: 8 %
Neutrophils Absolute: 2.8 10*3/uL (ref 1.4–7.0)
Neutrophils: 60 %
Platelets: 98 10*3/uL — CL (ref 150–450)
RBC: 4.56 x10E6/uL (ref 4.14–5.80)
RDW: 12.9 % (ref 11.6–15.4)
WBC: 4.7 10*3/uL (ref 3.4–10.8)

## 2023-10-25 LAB — TSH: TSH: 1.76 u[IU]/mL (ref 0.450–4.500)

## 2023-10-25 LAB — LIPID PANEL W/O CHOL/HDL RATIO
Cholesterol, Total: 158 mg/dL (ref 100–199)
HDL: 69 mg/dL (ref >39)
LDL Chol Calc (NIH): 79 mg/dL (ref 0–99)
Triglycerides: 48 mg/dL (ref 0–149)
VLDL Cholesterol Cal: 10 mg/dL (ref 5–40)

## 2023-10-25 LAB — PSA: Prostate Specific Ag, Serum: 0.8 ng/mL (ref 0.0–4.0)

## 2023-10-27 ENCOUNTER — Encounter: Payer: Self-pay | Admitting: Internal Medicine

## 2023-10-27 ENCOUNTER — Ambulatory Visit (INDEPENDENT_AMBULATORY_CARE_PROVIDER_SITE_OTHER): Payer: Medicaid Other | Admitting: Internal Medicine

## 2023-10-27 VITALS — BP 100/60 | HR 67 | Resp 16 | Ht 74.0 in | Wt 207.0 lb

## 2023-10-27 DIAGNOSIS — Z0000 Encounter for general adult medical examination without abnormal findings: Secondary | ICD-10-CM | POA: Diagnosis not present

## 2023-10-27 DIAGNOSIS — N486 Induration penis plastica: Secondary | ICD-10-CM

## 2023-10-27 DIAGNOSIS — D696 Thrombocytopenia, unspecified: Secondary | ICD-10-CM

## 2023-10-27 DIAGNOSIS — K7031 Alcoholic cirrhosis of liver with ascites: Secondary | ICD-10-CM | POA: Diagnosis not present

## 2023-10-27 DIAGNOSIS — F17200 Nicotine dependence, unspecified, uncomplicated: Secondary | ICD-10-CM | POA: Diagnosis not present

## 2023-10-27 DIAGNOSIS — Z716 Tobacco abuse counseling: Secondary | ICD-10-CM

## 2023-10-27 NOTE — Progress Notes (Signed)
 Subjective:    Patient ID: Stephen Miranda, male   DOB: 1971/01/07, 53 y.o.   MRN: [id number]   HPI  Here for Male CPE:  1.  STE:  Does perform.  No concerning findings.  No family history of testicular cancer.    2.  PSA:  Normal at 0.8 10/24/23.  No family history of prostate cancer.    3.  Guaiac Cards/FIT:  Last 04/2019 and positive.    4.  Colonoscopy:  10/08/2023 colonoscopy and EGD with Dr. Meridee Score.  3 polyps, appears to have one hyperplastic and 2 adenomatous.  No dysplastic changes.  Plans for repeat in 2028.  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  Also chronic gastritis.  To continue PPI.  No findings of esophageal varices.  .   5.  Cholesterol/Glucose:  Cholesterol at goal with significant increase of HDL to 69 from 29 in 2023.  Blood glucose has been fine. Lipid Panel     Component Value Date/Time   CHOL 158 10/24/2023 0928   TRIG 48 10/24/2023 0928   HDL 69 10/24/2023 0928   LDLCALC 79 10/24/2023 0928   LABVLDL 10 10/24/2023 0928     6.  Immunizations: Did not get COVID vaccine for the 2024/2025 year Immunization History  Administered Date(s) Administered   Hepatitis A, Adult 03/18/2022, 09/24/2022   Hepatitis B, ADULT 03/18/2022, 07/25/2022, 09/24/2022   Influenza, Mdck, Trivalent,PF 6+ MOS(egg free) 06/24/2023   Influenza,inj,Quad PF,6+ Mos 06/01/2017, 07/11/2019   Moderna Covid-19 Fall Seasonal Vaccine 39yrs & older 08/19/2022   Moderna Covid-19 Vaccine Bivalent Booster 69yrs & up 12/28/2021   Moderna Sars-Covid-2 Vaccination 10/01/2019, 11/01/2019   PNEUMOCOCCAL CONJUGATE-20 01/07/2023   Pneumococcal Polysaccharide-23 06/25/2019   Tdap 04/18/2015   Zoster Recombinant(Shingrix) 06/24/2023     Current Meds  Medication Sig   escitalopram (LEXAPRO) 5 MG tablet TAKE 1 TABLET BY MOUTH DAILY   ferrous gluconate (FERGON) 324 MG tablet Take 324 mg by mouth daily with breakfast.   furosemide (LASIX) 20 MG tablet TAKE 1 TABLET BY MOUTH DAILY  in THE morning WITH spirnolactone   Lactulose 20 GM/30ML SOLN 30 ml by mouth 3 times daily   Multiple Vitamin (MULTIVITAMIN WITH MINERALS) TABS tablet Take 1 tablet by mouth daily.   pantoprazole (PROTONIX) 40 MG tablet TAKE 1 TABLET BY MOUTH 2 TIMES DAILY   spironolactone (ALDACTONE) 50 MG tablet TAKE 1 TABLET BY MOUTH IN THE MORNING WITH furosemide   tamsulosin (FLOMAX) 0.4 MG CAPS capsule TAKE 1 CAPSULE BY MOUTH DAILY AFTER SUPPER   No Known Allergies  Past Medical History:  Diagnosis Date   Basal cell carcinoma    Chronic insomnia 05/23/2016   Has stated in past since age 62 yo, but worse in recent years with restless legs.   Elevated liver enzymes 04/24/2015   04/18/2015:  AST:  115     ALT:  90. Likely due to alcohol intake   Malignant melanoma of back (HCC) 02/07/2015   Left upper back:  Dr. Irene Limbo [facility]   Restless legs 05/23/2016   Seborrheic dermatitis 05/23/2016   Past Surgical History:  Procedure Laterality Date   ESOPHAGEAL BANDING  12/26/2022   Procedure: ESOPHAGEAL BANDING;  Surgeon: Imogene Burn, MD;  Location: [facility] ENDOSCOPY;  Service: Gastroenterology;;   ESOPHAGOGASTRODUODENOSCOPY N/A 07/03/2022   Procedure: ESOPHAGOGASTRODUODENOSCOPY (EGD);  Surgeon: Beverley Fiedler, MD;  Location: [facility] ENDOSCOPY;  Service: Gastroenterology;  Laterality: N/A;   ESOPHAGOGASTRODUODENOSCOPY (EGD) WITH PROPOFOL N/A 05/20/2019  Procedure: ESOPHAGOGASTRODUODENOSCOPY (EGD) WITH PROPOFOL;  Surgeon: Meridee Score Netty Starring., MD;  Location: [facility] ENDOSCOPY;  Service: Gastroenterology;  Laterality: N/A;   ESOPHAGOGASTRODUODENOSCOPY (EGD) WITH PROPOFOL N/A 12/26/2022   Procedure: ESOPHAGOGASTRODUODENOSCOPY (EGD) WITH PROPOFOL;  Surgeon: Imogene Burn, MD;  Location: [facility] ENDOSCOPY;  Service: Gastroenterology;  Laterality: N/A;   Excision Malignant Melanoma Left 02/07/2015   Left upper back   IR ANGIOGRAM SELECTIVE EACH ADDITIONAL VESSEL  07/04/2022   IR EMBO ART  VEN HEMORR  LYMPH EXTRAV  INC GUIDE ROADMAPPING  07/04/2022   IR PARACENTESIS  07/18/2022   IR RADIOLOGIST EVAL & MGMT  02/12/2023   IR TIPS  01/03/2023   IR US GUIDE VASC ACCESS RIGHT  07/04/2022   IR US GUIDE VASC ACCESS RIGHT  01/03/2023   IR VENOGRAM RENAL UNI LEFT  07/04/2022   PARACENTESIS  07/18/2022   RADIOLOGY WITH ANESTHESIA N/A 07/04/2022   Procedure: IR WITH ANESTHESIA - TIPS;  Surgeon: Radiologist, Medication, MD;  Location: [facility] OR;  Service: Radiology;  Laterality: N/A;   RADIOLOGY WITH ANESTHESIA N/A 01/03/2023   Procedure: TIPS WITH ANESTHESIA;  Surgeon: Pernell Dupre, MD;  Location: [facility] OR;  Service: Radiology;  Laterality: N/A;   Family History  Problem Relation Age of Onset   Fibromyalgia Mother    Colon cancer Neg Hx    Rectal cancer Neg Hx    Stomach cancer Neg Hx    Colon polyps Neg Hx    Social History   Socioeconomic History   Marital status: Single    Spouse name: Not on file   Number of children: 0   Years of education: one class away from [employer] in Therapist, music   Highest education level: Not on file  Occupational History   Occupation: unemployed/disability  Tobacco Use   Smoking status: Every Day    Current packs/day: 0.25    Average packs/day: 0.5 packs/day for 31.3 years (15.3 ttl pk-yrs)    Types: Cigarettes    Start date: 1994    Passive exposure: Past   Smokeless tobacco: Never   Tobacco comments:    prescribed Chantix in past, but returned to use after stopped the Chantix  Vaping Use   Vaping status: Never Used  Substance and Sexual Activity   Alcohol use: Not Currently    Alcohol/week: 4.0 standard drinks of alcohol    Types: 2 Cans of beer, 2 Shots of liquor per week    Comment: History of alcohol use disorder   Drug use: No   Sexual activity: Never  Other Topics Concern   Not on file  Social History Narrative   Lives in own apartment.   Parents moving back to [place] soon.   Social Drivers of Corporate investment banker Strain: Low Risk   (10/27/2023)   Overall Financial Resource Strain (CARDIA)    Difficulty of Paying Living Expenses: Not very hard  Food Insecurity: No Food Insecurity (10/27/2023)   Hunger Vital Sign    Worried About Running Out of Food in the Last Year: Never true    Ran Out of Food in the Last Year: Never true  Transportation Needs: No Transportation Needs (10/27/2023)   PRAPARE - Administrator, Civil Service (Medical): No    Lack of Transportation (Non-Medical): No  Physical Activity: Not on file  Stress: Not on file  Social Connections: Not on file  Intimate Partner Violence: Not At Risk (10/27/2023)   Humiliation, Afraid, Rape, and Kick questionnaire    Fear  of Current or Ex-Partner: No    Emotionally Abused: No    Physically Abused: No    Sexually Abused: No      Review of Systems  HENT:  Negative for dental problem (Not obtaining dental care.  Does have Medicaid.).   Eyes:  Negative for visual disturbance (Current prescription is fine.).  Respiratory:  Negative for shortness of breath.   Cardiovascular:  Negative for chest pain and leg swelling.  Gastrointestinal:  Negative for abdominal distention (No swelling as well.), abdominal pain and blood in stool (No melena.).  Genitourinary:        Feels he has peyronie's--penile curvature with erection that is uncomfortable.  Musculoskeletal:        All joints save for hips and elbows ached after sedation for colonoscopy.  Skin:        Followed by derm for history of melanoma.  Had BCC removed from left jaw line in Jan/Dec      Objective:   BP 100/60 (BP Location: Left Arm, Patient Position: Sitting, Cuff Size: Normal)   Pulse 67   Resp 16   Ht 6\' 2"  (1.88 m)   Wt 207 lb (93.9 kg)   SpO2 98%   BMI 26.58 kg/m   Physical Exam HENT:     Head: Normocephalic and atraumatic.     Right Ear: Tympanic membrane, ear canal and external ear normal.     Left Ear: Tympanic membrane, ear canal and external ear normal.     Nose: Nose  normal.     Mouth/Throat:     Mouth: Mucous membranes are moist.     Pharynx: Oropharynx is clear.     Comments: Significant plaque formation on teeth at gingival line. Eyes:     Extraocular Movements: Extraocular movements intact.     Conjunctiva/sclera: Conjunctivae normal.     Pupils: Pupils are equal, round, and reactive to light.     Comments: Discs sharp  Neck:     Thyroid: No thyroid mass or thyromegaly.  Cardiovascular:     Rate and Rhythm: Normal rate and regular rhythm.     Heart sounds: S1 normal and S2 normal. No murmur heard.    No friction rub. No S3 or S4 sounds.     Comments: No carotid bruits.  Carotid, radial, femoral, DP and PT pulses normal and equal.   Pulmonary:     Effort: Pulmonary effort is normal.     Breath sounds: Normal breath sounds and air entry.  Abdominal:     General: Bowel sounds are normal.     Palpations: Abdomen is soft. There is no hepatomegaly, splenomegaly or mass.     Tenderness: There is no abdominal tenderness.     Hernia: No hernia is present.     Comments: Angiomata across upper abdomen.  Genitourinary:    Penis: Normal.      Testes:        Right: Mass or tenderness not present. Right testis is descended.        Left: Mass or tenderness not present. Left testis is descended.     Comments: No thickening of skin on dorsal penis Musculoskeletal:        General: Normal range of motion.     Cervical back: Normal range of motion and neck supple.     Right lower leg: 1+ Pitting Edema (mottled brown discoloration on dorsal feet and ankles/pretib area bilaterally) present.     Left lower leg: 1+ Pitting Edema present.  Lymphadenopathy:     Head:     Right side of head: No submental or submandibular adenopathy.     Left side of head: No submental or submandibular adenopathy.     Cervical: No cervical adenopathy.     Upper Body:     Right upper body: No supraclavicular adenopathy.     Left upper body: No supraclavicular adenopathy.      Lower Body: No right inguinal adenopathy. No left inguinal adenopathy.  Skin:    General: Skin is warm.     Capillary Refill: Capillary refill takes less than 2 seconds.  Neurological:     General: No focal deficit present.     Mental Status: He is alert and oriented to person, place, and time.     Cranial Nerves: Cranial nerves 2-12 are intact.     Sensory: Sensation is intact.     Motor: Motor function is intact.     Coordination: Coordination is intact.     Gait: Gait is intact.     Deep Tendon Reflexes: Reflexes are normal and symmetric.  Psychiatric:        Mood and Affect: Mood normal.        Behavior: Behavior normal. Behavior is cooperative.      Assessment & Plan   CPE Recent colonoscopy and EGD, so no FIT this year.  2.  Cirrhosis:  compensated S/P TIPS and doing well.  Denies alcohol use.  Associated Thrombocytopenia stable.    3.  Tobacco Use Disorder:  encouraged using nicotine gum or lozenges.  4.  Possible Peyronie's:  No findings on exam today.  Referral to Urology  5.  Need for dental care:  handout on dentists taking Medicaid.

## 2023-11-04 ENCOUNTER — Other Ambulatory Visit: Payer: Self-pay | Admitting: Internal Medicine

## 2023-11-05 ENCOUNTER — Other Ambulatory Visit: Payer: Self-pay | Admitting: Internal Medicine

## 2023-11-06 ENCOUNTER — Other Ambulatory Visit: Payer: Self-pay | Admitting: Radiology

## 2023-11-06 DIAGNOSIS — I8510 Secondary esophageal varices without bleeding: Secondary | ICD-10-CM

## 2023-11-07 ENCOUNTER — Other Ambulatory Visit: Payer: Self-pay | Admitting: Internal Medicine

## 2023-11-14 ENCOUNTER — Inpatient Hospital Stay: Admission: RE | Admit: 2023-11-14 | Source: Ambulatory Visit

## 2023-11-19 ENCOUNTER — Ambulatory Visit
Admission: RE | Admit: 2023-11-19 | Discharge: 2023-11-19 | Disposition: A | Discharge status: Home / Self Care | Source: Ambulatory Visit | Attending: Interventional Radiology

## 2023-11-19 DIAGNOSIS — I8510 Secondary esophageal varices without bleeding: Secondary | ICD-10-CM

## 2023-11-24 ENCOUNTER — Ambulatory Visit

## 2023-11-24 DIAGNOSIS — Z23 Encounter for immunization: Secondary | ICD-10-CM | POA: Diagnosis not present

## 2024-02-04 ENCOUNTER — Ambulatory Visit: Payer: Medicaid Other | Admitting: Dermatology

## 2024-03-11 ENCOUNTER — Telehealth: Payer: Self-pay | Admitting: Internal Medicine

## 2024-03-11 NOTE — Telephone Encounter (Signed)
 Patient would like an appointment for patient states he has been having pain on his right leg, patient states he believes he raptured a vessel as it was painful days ago . Patient states he does not recall doing anything that could of set it off.   We will call patient if there is a cancellation.

## 2024-03-12 NOTE — Telephone Encounter (Signed)
 Patient has been scheduled

## 2024-03-15 ENCOUNTER — Ambulatory Visit: Admitting: Internal Medicine

## 2024-03-15 ENCOUNTER — Encounter: Payer: Self-pay | Admitting: Internal Medicine

## 2024-03-15 VITALS — BP 100/68 | HR 62 | Resp 18 | Ht 74.0 in | Wt 211.0 lb

## 2024-03-15 DIAGNOSIS — M79604 Pain in right leg: Secondary | ICD-10-CM | POA: Diagnosis not present

## 2024-03-15 DIAGNOSIS — M7989 Other specified soft tissue disorders: Secondary | ICD-10-CM

## 2024-03-15 MED ORDER — KETOCONAZOLE 2 % EX CREA: 1.0000 | TOPICAL_CREAM | Freq: Two times a day (BID) | CUTANEOUS | 2 refills | Status: DC | PRN

## 2024-03-15 NOTE — Patient Instructions (Signed)
 Go to ED if chest pain or shortness of breath

## 2024-03-15 NOTE — Progress Notes (Unsigned)
    Subjective:    Patient ID: Stephen Miranda, male   DOB: 1971/05/14, 53 y.o.   MRN: [id number]   HPI  About 1 week ago, developed pain circumferentially from mid calf to ankle and violaceous coloration to the same area--looked like a burgundy sock.  He had more pain over the posterior aspect of the leg  No pain of leg now. He had a little bit of swelling associated with this. No fever, but did feel really tired for the next 2 days when had discoloration.   Skin was a bit more warm and tender to touch than the skin not discolored No discoloration of toes and were warm with normal feeling of toes. No chest pain or dyspnea.     Current Meds  Medication Sig   escitalopram  (LEXAPRO ) 5 MG tablet TAKE 1 TABLET BY MOUTH DAILY   ferrous gluconate  (FERGON) 324 MG tablet Take 324 mg by mouth daily with breakfast.   furosemide  (LASIX ) 20 MG tablet TAKE 1 TABLET BY MOUTH DAILY in THE morning WITH spirnolactone   lactulose , encephalopathy, (CHRONULAC ) 10 GM/15ML SOLN TAKE 30 mls BY MOUTH 3 TIMES DAILY   Multiple Vitamin (MULTIVITAMIN WITH MINERALS) TABS tablet Take 1 tablet by mouth daily.   pantoprazole  (PROTONIX ) 40 MG tablet TAKE 1 TABLET BY MOUTH 2 TIMES DAILY   spironolactone  (ALDACTONE ) 50 MG tablet TAKE 1 TABLET BY MOUTH IN THE MORNING WITH furosemide    tadalafil (CIALIS) 5 MG tablet Take 5 mg by mouth daily at 6 (six) AM.   tamsulosin  (FLOMAX ) 0.4 MG CAPS capsule TAKE 1 CAPSULE BY MOUTH DAILY AFTER SUPPER   No Known Allergies   Review of Systems    Objective:   BP 100/68 (BP Location: Left Arm, Patient Position: Sitting, Cuff Size: Normal)   Pulse 62   Resp 18   Ht 6' 2 (1.88 m)   Wt 211 lb (95.7 kg)   BMI 27.09 kg/m   Physical Exam Bilateral good DP pulses, unable to feel good DP pulses bilaterally. Bilateral venous insufficiency like dermatitis with brown speckling of bilateral LE to mid calf.   Mild edema of left LE, ankle as see indentations from athletic sock. Right LE  with darker red/violaceous coloration extending up above ankle circumferentially, with mildly increased warmth and mildly increased edema.  This does not extend to distal foot or toes.   NT over area of red coloration or rest of leg. Not clear if has cords at medial calf just above redness, but NT here as well..         Assessment & Plan   Right LE edema and redness, which is now improved, and patient actually with issues with not clotting rather than forming DVTs, but with his findings and history, need to rule out DVT.   The redness and swelling today do not support a cellulitis, however, check CBC, CMP Sending for venous dopplers to R/O DVT.

## 2024-03-16 ENCOUNTER — Ambulatory Visit: Admitting: Internal Medicine

## 2024-03-16 ENCOUNTER — Ambulatory Visit: Payer: Self-pay | Admitting: Internal Medicine

## 2024-03-16 LAB — COMPREHENSIVE METABOLIC PANEL WITH GFR
ALT: 19 IU/L (ref 0–44)
AST: 30 IU/L (ref 0–40)
Albumin: 3.7 g/dL — ABNORMAL LOW (ref 3.8–4.9)
Alkaline Phosphatase: 132 IU/L — ABNORMAL HIGH (ref 44–121)
BUN/Creatinine Ratio: 9 (ref 9–20)
BUN: 7 mg/dL (ref 6–24)
Bilirubin Total: 0.8 mg/dL (ref 0.0–1.2)
CO2: 23 mmol/L (ref 20–29)
Calcium: 9.1 mg/dL (ref 8.7–10.2)
Chloride: 104 mmol/L (ref 96–106)
Creatinine, Ser: 0.74 mg/dL — ABNORMAL LOW (ref 0.76–1.27)
Globulin, Total: 2.7 g/dL (ref 1.5–4.5)
Glucose: 72 mg/dL (ref 70–99)
Potassium: 4.6 mmol/L (ref 3.5–5.2)
Sodium: 139 mmol/L (ref 134–144)
Total Protein: 6.4 g/dL (ref 6.0–8.5)
eGFR: 109 mL/min/1.73 (ref >59)

## 2024-03-16 LAB — CBC WITH DIFFERENTIAL/PLATELET
Basophils Absolute: 0.1 x10E3/uL (ref 0.0–0.2)
Basos: 1 %
EOS (ABSOLUTE): 0.1 x10E3/uL (ref 0.0–0.4)
Eos: 2 %
Hematocrit: 43 % (ref 37.5–51.0)
Hemoglobin: 14.6 g/dL (ref 13.0–17.7)
Immature Grans (Abs): 0 x10E3/uL (ref 0.0–0.1)
Immature Granulocytes: 0 %
Lymphocytes Absolute: 1.6 x10E3/uL (ref 0.7–3.1)
Lymphs: 30 %
MCH: 33.3 pg — ABNORMAL HIGH (ref 26.6–33.0)
MCHC: 34 g/dL (ref 31.5–35.7)
MCV: 98 fL — ABNORMAL HIGH (ref 79–97)
Monocytes Absolute: 0.4 x10E3/uL (ref 0.1–0.9)
Monocytes: 7 %
Neutrophils Absolute: 3.2 x10E3/uL (ref 1.4–7.0)
Neutrophils: 60 %
Platelets: 120 x10E3/uL — ABNORMAL LOW (ref 150–450)
RBC: 4.38 x10E6/uL (ref 4.14–5.80)
RDW: 12.1 % (ref 11.6–15.4)
WBC: 5.3 x10E3/uL (ref 3.4–10.8)

## 2024-03-16 NOTE — Telephone Encounter (Signed)
 Called and spoke with patient--he had not seen my note in My chart regarding labs. Received a call from imaging about any need for PA for venous doppler.   No PA needed after call to Medicaid. He has not heard from imaging yet.   Will have Kalisha call in morning to find out when plan to have him come in.

## 2024-03-17 ENCOUNTER — Ambulatory Visit (HOSPITAL_COMMUNITY)
Admission: RE | Admit: 2024-03-17 | Discharge: 2024-03-17 | Disposition: A | Discharge status: Home / Self Care | Source: Ambulatory Visit | Attending: Internal Medicine | Admitting: Internal Medicine

## 2024-03-17 DIAGNOSIS — M79604 Pain in right leg: Secondary | ICD-10-CM | POA: Diagnosis not present

## 2024-03-17 DIAGNOSIS — M7989 Other specified soft tissue disorders: Secondary | ICD-10-CM | POA: Diagnosis not present

## 2024-03-18 ENCOUNTER — Other Ambulatory Visit: Payer: Self-pay | Admitting: Internal Medicine

## 2024-04-27 ENCOUNTER — Ambulatory Visit: Admitting: Internal Medicine

## 2024-04-27 ENCOUNTER — Encounter: Payer: Self-pay | Admitting: Internal Medicine

## 2024-04-27 VITALS — BP 122/70 | HR 61 | Resp 18 | Ht 74.0 in | Wt 221.0 lb

## 2024-04-27 DIAGNOSIS — L219 Seborrheic dermatitis, unspecified: Secondary | ICD-10-CM

## 2024-04-27 DIAGNOSIS — K7031 Alcoholic cirrhosis of liver with ascites: Secondary | ICD-10-CM

## 2024-04-27 DIAGNOSIS — R600 Localized edema: Secondary | ICD-10-CM

## 2024-04-27 DIAGNOSIS — N4889 Other specified disorders of penis: Secondary | ICD-10-CM

## 2024-04-27 DIAGNOSIS — M79671 Pain in right foot: Secondary | ICD-10-CM

## 2024-04-27 DIAGNOSIS — Z23 Encounter for immunization: Secondary | ICD-10-CM

## 2024-04-27 DIAGNOSIS — L84 Corns and callosities: Secondary | ICD-10-CM

## 2024-04-27 MED ORDER — TAMSULOSIN HCL 0.4 MG PO CAPS: ORAL_CAPSULE | ORAL | 3 refills | Status: AC

## 2024-04-27 MED ORDER — PANTOPRAZOLE SODIUM 40 MG PO TBEC: 40.0000 mg | DELAYED_RELEASE_TABLET | Freq: Two times a day (BID) | ORAL | 3 refills | Status: AC

## 2024-04-27 MED ORDER — TADALAFIL 5 MG PO TABS: 5.0000 mg | ORAL_TABLET | Freq: Every day | ORAL | 3 refills | Status: AC

## 2024-04-27 MED ORDER — KETOCONAZOLE 2 % EX CREA: 1.0000 | TOPICAL_CREAM | Freq: Two times a day (BID) | CUTANEOUS | 6 refills | Status: AC | PRN

## 2024-04-27 MED ORDER — ESCITALOPRAM OXALATE 5 MG PO TABS: 5.0000 mg | ORAL_TABLET | Freq: Every day | ORAL | 3 refills | Status: AC

## 2024-04-27 MED ORDER — LACTULOSE ENCEPHALOPATHY 10 GM/15ML PO SOLN: ORAL | 3 refills | Status: AC

## 2024-04-27 MED ORDER — FUROSEMIDE 20 MG PO TABS: ORAL_TABLET | ORAL | 3 refills | Status: AC

## 2024-04-27 MED ORDER — SPIRONOLACTONE 50 MG PO TABS: ORAL_TABLET | ORAL | 3 refills | Status: AC

## 2024-04-27 NOTE — Progress Notes (Signed)
 Peripheral edema:  still with intermittent edema.  Furosemide  20 mg daily with Spironolactone  50 mg daily currently Increase Furosemide  to 40 mg on days where has more edema.  CMP in 2 weeks.    2.  Concern for Peyronie's:  Dr. Donnice Siad.   Per patient, was seen by urology and told they could perform circumcision as his skin is tight, but does not have peyronie's.  Reportedly, no Peyronies and does not want circumcision. He was given stretches of skin to perform and will do that.  Will get urology notes to confirm  3.  Dental concerns:  not looking currently as moving to [place] next month.    4.  Callus on right foot:  shaved off several months ago by podiatry.  Refer back to Dr. Scherrie and [facility].  May need Xray or orthotic.    5.  Cirrhosis with history of ascites and LE edema/UGI bleed.  S/P TIPS.  Doing well.  No Headache in a long time. Stable.    6.  HM:  has not had influenza nor COVID vaccination this fall Given.

## 2024-04-27 NOTE — Progress Notes (Cosign Needed)
 Subjective:     Patient ID: Stephen Miranda, male   DOB: February 08, 1971, 53 y.o.   MRN: [id number]  CC- pt here for 6 month f/u; h/o multiple medical problems  HPI-  Last physical reviewed from 4/25 as well as latest labs and US .  1-f/u of tobacco needed; was not addressed today. 2- f/u alcohol abstinance- denies alcohol use. 3- f/u his liver dz.  - stable.   BLE left >right. Compliant with Lasix  and Spirolactone and low salt diet.   There is some difficulty concentrating at times but usually he snaps out out of it.  Says it might be his ADD.    No jaundice, nausea, vomiting, confusion.  His US  was negative for DVT.  He says there was nothing to worry about. 4- h/o abnormal penis curvature- followed up with Urology. Was told it was not Peyronie's.  It was abnormal growth of skin.  He was given stretches ; if that does not work and pt is still concerned, circumcision might be needed. 5-left foot callous bothering him still- Had seen Podiatrist who shaved it and recommended OTC meds; he says he has been using the OTC meds whose name he cannot say and it has not been helping.  He is using [employer] in his shoes. 6- Dental care- He is moving to another state to live with his parents. He needs to find Dentist there.  7-Has not had flu and Covid: OK with getting these today. 8- H/o skin cancer- no issues.    PMH/PSH- reviewed on EPIC No Known Allergies  Meds- Reviewed on EPIC Last labs reviewed on EPIC Objective:    Vitals:   04/27/24 1408  BP: 122/70  Pulse: 61  Resp: 18   Physical Exam Vitals and nursing note reviewed.  Constitutional:      General: He is not in acute distress.    Appearance: Normal appearance. He is normal weight. He is not ill-appearing, toxic-appearing or diaphoretic.  Pulmonary:     Effort: Pulmonary effort is normal.  Abdominal:     General: There is no distension.  Musculoskeletal:        General: Swelling (pitting edema of both legs to mid calf left > right) present.   Skin:    Comments: Chronic skin changes of legs. No cellulitis. Left sole over the meta-tarsal area: 1 by 1 cm calous that is tender but without erythema or pus  Neurological:     Mental Status: He is alert.     Comments: Normal gait. No confusion. Alert and interactive  Psychiatric:        Mood and Affect: Mood normal.        Behavior: Behavior normal.        Judgment: Judgment normal.        Assessment and Plan   1- Liver Cirrhosis s/p TIPS - stable. Monitor. 2- Alcoholism- in remission. Monitor. 3- Tobacco- Will address next visit 4- B Leg edema left> right - DVT ruled out by US .  Increase lasix  from 20 mg to 40 mg every day. Continue Spirnolactone same dose. Continue low salt diet. 5- abnormal curvature to Penis- Continue current stretching, f/u with Urology when needed and if it bothers patient. 6- Dental care- establish care at the location in the new town where he is moving.  7-Loss of concentration at times - not bothering him too much. Does not seem to be hepatic encephalopathy at this time.  Maybe his ADD.  Asked to try looking at the different  objects in the room to help focus his attention.  If it gets worse will pursue further. 8-H/o skin cancer- Asked to establish care with a dermatologist in the next town. 9- Health maintenance issues- Flu and Covid given today and Meds refilled.  10-Left foot callous- podiatry referrral placed RTC in 6 months with labs for next check-up.           See under assessment for plan. Ignore this section.

## 2024-05-11 ENCOUNTER — Other Ambulatory Visit

## 2024-05-21 ENCOUNTER — Other Ambulatory Visit (INDEPENDENT_AMBULATORY_CARE_PROVIDER_SITE_OTHER)

## 2024-05-21 DIAGNOSIS — Z79899 Other long term (current) drug therapy: Secondary | ICD-10-CM

## 2024-05-22 ENCOUNTER — Ambulatory Visit: Payer: Self-pay | Admitting: Internal Medicine

## 2024-05-22 LAB — COMPREHENSIVE METABOLIC PANEL WITH GFR
ALT: 21 IU/L (ref 0–44)
AST: 31 IU/L (ref 0–40)
Albumin: 3.5 g/dL — ABNORMAL LOW (ref 3.8–4.9)
Alkaline Phosphatase: 129 IU/L — ABNORMAL HIGH (ref 47–123)
BUN/Creatinine Ratio: 10 (ref 9–20)
BUN: 7 mg/dL (ref 6–24)
Bilirubin Total: 1 mg/dL (ref 0.0–1.2)
CO2: 21 mmol/L (ref 20–29)
Calcium: 9.1 mg/dL (ref 8.7–10.2)
Chloride: 105 mmol/L (ref 96–106)
Creatinine, Ser: 0.71 mg/dL — ABNORMAL LOW (ref 0.76–1.27)
Globulin, Total: 2.5 g/dL (ref 1.5–4.5)
Glucose: 93 mg/dL (ref 70–99)
Potassium: 4 mmol/L (ref 3.5–5.2)
Sodium: 140 mmol/L (ref 134–144)
Total Protein: 6 g/dL (ref 6.0–8.5)
eGFR: 110 mL/min/1.73 (ref >59)

## 2024-10-26 ENCOUNTER — Other Ambulatory Visit

## 2024-10-28 ENCOUNTER — Encounter: Admitting: Internal Medicine
